# Patient Record
Sex: Female | Born: 1950
Health system: Southern US, Community
[De-identification: ages and names within clinical notes are randomized; demographics above are authoritative.]

## PROBLEM LIST (undated history)

## (undated) VITALS — BP 95/62 | HR 86 | Temp 97.4°F | Resp 16 | Ht 63.0 in | Wt 155.0 lb

## (undated) DIAGNOSIS — F329 Major depressive disorder, single episode, unspecified: Secondary | ICD-10-CM

## (undated) DIAGNOSIS — F419 Anxiety disorder, unspecified: Secondary | ICD-10-CM

## (undated) DIAGNOSIS — I10 Essential (primary) hypertension: Secondary | ICD-10-CM

## (undated) DIAGNOSIS — F32A Depression, unspecified: Secondary | ICD-10-CM

## (undated) DIAGNOSIS — E785 Hyperlipidemia, unspecified: Secondary | ICD-10-CM

## (undated) DIAGNOSIS — F209 Schizophrenia, unspecified: Secondary | ICD-10-CM

## (undated) HISTORY — DX: Depression, unspecified: F32.A

## (undated) HISTORY — DX: Hyperlipidemia, unspecified: E78.5

## (undated) HISTORY — PX: EXPLORATORY LAPAROTOMY: SUR591

## (undated) HISTORY — DX: Anxiety disorder, unspecified: F41.9

## (undated) HISTORY — PX: BREAST SURGERY: SHX581

## (undated) HISTORY — DX: Essential (primary) hypertension: I10

## (undated) HISTORY — DX: Major depressive disorder, single episode, unspecified: F32.9

---

## 1999-04-14 ENCOUNTER — Inpatient Hospital Stay (HOSPITAL_COMMUNITY): Admission: EM | Admit: 1999-04-14 | Discharge: 1999-04-17 | Payer: Self-pay | Admitting: Psychiatry

## 1999-10-06 ENCOUNTER — Emergency Department (HOSPITAL_COMMUNITY): Admission: EM | Admit: 1999-10-06 | Discharge: 1999-10-06 | Payer: Self-pay | Admitting: Emergency Medicine

## 2005-09-06 ENCOUNTER — Ambulatory Visit: Payer: Self-pay | Admitting: Family Medicine

## 2005-09-06 ENCOUNTER — Other Ambulatory Visit: Admission: RE | Admit: 2005-09-06 | Discharge: 2005-09-06 | Payer: Self-pay | Admitting: Family Medicine

## 2005-09-06 ENCOUNTER — Encounter (INDEPENDENT_AMBULATORY_CARE_PROVIDER_SITE_OTHER): Payer: Self-pay | Admitting: Specialist

## 2005-10-03 ENCOUNTER — Ambulatory Visit (HOSPITAL_COMMUNITY): Admission: RE | Admit: 2005-10-03 | Discharge: 2005-10-03 | Payer: Self-pay | Admitting: Family Medicine

## 2007-09-22 ENCOUNTER — Ambulatory Visit (HOSPITAL_COMMUNITY): Admission: RE | Admit: 2007-09-22 | Discharge: 2007-09-22 | Payer: Self-pay | Admitting: Internal Medicine

## 2008-02-06 ENCOUNTER — Ambulatory Visit: Payer: Self-pay | Admitting: Obstetrics & Gynecology

## 2008-02-06 ENCOUNTER — Encounter: Payer: Self-pay | Admitting: Family

## 2008-02-17 ENCOUNTER — Encounter: Admission: RE | Admit: 2008-02-17 | Discharge: 2008-02-17 | Payer: Self-pay | Admitting: Obstetrics & Gynecology

## 2008-05-07 ENCOUNTER — Inpatient Hospital Stay (HOSPITAL_COMMUNITY): Admission: AD | Admit: 2008-05-07 | Discharge: 2008-05-07 | Payer: Self-pay | Admitting: Obstetrics & Gynecology

## 2008-09-18 ENCOUNTER — Emergency Department (HOSPITAL_COMMUNITY): Admission: EM | Admit: 2008-09-18 | Discharge: 2008-09-19 | Payer: Self-pay | Admitting: Emergency Medicine

## 2008-12-07 ENCOUNTER — Ambulatory Visit (HOSPITAL_COMMUNITY): Admission: RE | Admit: 2008-12-07 | Discharge: 2008-12-07 | Payer: Self-pay | Admitting: Internal Medicine

## 2009-12-08 ENCOUNTER — Encounter: Payer: Self-pay | Admitting: Internal Medicine

## 2009-12-08 ENCOUNTER — Ambulatory Visit: Payer: Self-pay | Admitting: Cardiology

## 2009-12-08 ENCOUNTER — Inpatient Hospital Stay (HOSPITAL_COMMUNITY): Admission: AD | Admit: 2009-12-08 | Discharge: 2009-12-10 | Payer: Self-pay | Admitting: Internal Medicine

## 2009-12-09 ENCOUNTER — Ambulatory Visit: Payer: Self-pay | Admitting: Vascular Surgery

## 2009-12-09 ENCOUNTER — Encounter (INDEPENDENT_AMBULATORY_CARE_PROVIDER_SITE_OTHER): Payer: Self-pay | Admitting: Internal Medicine

## 2009-12-21 ENCOUNTER — Ambulatory Visit (HOSPITAL_COMMUNITY): Admission: RE | Admit: 2009-12-21 | Discharge: 2009-12-21 | Payer: Self-pay | Admitting: Internal Medicine

## 2010-11-12 ENCOUNTER — Encounter: Payer: Self-pay | Admitting: Internal Medicine

## 2010-11-16 ENCOUNTER — Other Ambulatory Visit (HOSPITAL_COMMUNITY): Payer: Self-pay | Admitting: Internal Medicine

## 2010-11-16 DIAGNOSIS — Z139 Encounter for screening, unspecified: Secondary | ICD-10-CM

## 2010-11-16 DIAGNOSIS — Z1231 Encounter for screening mammogram for malignant neoplasm of breast: Secondary | ICD-10-CM

## 2010-11-30 ENCOUNTER — Ambulatory Visit: Payer: Self-pay | Admitting: Physician Assistant

## 2010-12-11 ENCOUNTER — Other Ambulatory Visit (HOSPITAL_COMMUNITY)
Admission: RE | Admit: 2010-12-11 | Discharge: 2010-12-11 | Disposition: A | Discharge status: Home / Self Care | Payer: PRIVATE HEALTH INSURANCE | Source: Ambulatory Visit | Attending: Obstetrics & Gynecology | Admitting: Obstetrics & Gynecology

## 2010-12-11 ENCOUNTER — Encounter: Payer: Self-pay | Admitting: Physician Assistant

## 2010-12-11 ENCOUNTER — Ambulatory Visit: Payer: Medicare Other | Admitting: Physician Assistant

## 2010-12-11 ENCOUNTER — Other Ambulatory Visit: Payer: Self-pay | Admitting: Physician Assistant

## 2010-12-11 DIAGNOSIS — Z01419 Encounter for gynecological examination (general) (routine) without abnormal findings: Secondary | ICD-10-CM

## 2010-12-11 DIAGNOSIS — N899 Noninflammatory disorder of vagina, unspecified: Secondary | ICD-10-CM

## 2010-12-11 DIAGNOSIS — Z124 Encounter for screening for malignant neoplasm of cervix: Secondary | ICD-10-CM | POA: Insufficient documentation

## 2010-12-11 LAB — CONVERTED CEMR LAB
Trich, Wet Prep: NONE SEEN
Yeast Wet Prep HPF POC: NONE SEEN

## 2010-12-26 ENCOUNTER — Inpatient Hospital Stay (HOSPITAL_COMMUNITY): Admission: RE | Admit: 2010-12-26 | Payer: Self-pay | Source: Ambulatory Visit

## 2011-01-09 ENCOUNTER — Ambulatory Visit (HOSPITAL_COMMUNITY)
Admission: RE | Admit: 2011-01-09 | Discharge: 2011-01-09 | Disposition: A | Discharge status: Home / Self Care | Payer: PRIVATE HEALTH INSURANCE | Source: Ambulatory Visit | Attending: Internal Medicine | Admitting: Internal Medicine

## 2011-01-09 DIAGNOSIS — Z1231 Encounter for screening mammogram for malignant neoplasm of breast: Secondary | ICD-10-CM | POA: Insufficient documentation

## 2011-01-11 LAB — CBC
HCT: 41.1 % (ref 36.0–46.0)
Hemoglobin: 13.4 g/dL (ref 12.0–15.0)
MCHC: 32.5 g/dL (ref 30.0–36.0)
MCV: 78.3 fL (ref 78.0–100.0)
Platelets: 205 10*3/uL (ref 150–400)
RBC: 5.26 MIL/uL — ABNORMAL HIGH (ref 3.87–5.11)
RDW: 15.3 % (ref 11.5–15.5)
WBC: 8.5 10*3/uL (ref 4.0–10.5)

## 2011-01-11 LAB — DIFFERENTIAL
Basophils Absolute: 0 10*3/uL (ref 0.0–0.1)
Basophils Relative: 0 % (ref 0–1)
Eosinophils Absolute: 0.2 10*3/uL (ref 0.0–0.7)
Eosinophils Relative: 2 % (ref 0–5)
Lymphocytes Relative: 33 % (ref 12–46)
Lymphs Abs: 2.8 10*3/uL (ref 0.7–4.0)
Monocytes Absolute: 0.6 10*3/uL (ref 0.1–1.0)
Monocytes Relative: 7 % (ref 3–12)
Neutro Abs: 4.9 10*3/uL (ref 1.7–7.7)
Neutrophils Relative %: 57 % (ref 43–77)

## 2011-01-11 LAB — BASIC METABOLIC PANEL
BUN: 8 mg/dL (ref 6–23)
CO2: 28 mEq/L (ref 19–32)
Calcium: 9 mg/dL (ref 8.4–10.5)
Chloride: 104 mEq/L (ref 96–112)
Creatinine, Ser: 0.6 mg/dL (ref 0.4–1.2)
GFR calc Af Amer: >60 mL/min (ref >60)
GFR calc non Af Amer: >60 mL/min (ref >60)
Glucose, Bld: 116 mg/dL — ABNORMAL HIGH (ref 70–99)
Potassium: 3.7 mEq/L (ref 3.5–5.1)
Sodium: 139 mEq/L (ref 135–145)

## 2011-01-11 LAB — URINALYSIS, ROUTINE W REFLEX MICROSCOPIC
Bilirubin Urine: NEGATIVE
Glucose, UA: NEGATIVE mg/dL
Hgb urine dipstick: NEGATIVE
Ketones, ur: NEGATIVE mg/dL
Nitrite: NEGATIVE
Protein, ur: NEGATIVE mg/dL
Specific Gravity, Urine: 1.006 (ref 1.005–1.030)
Urobilinogen, UA: 0.2 mg/dL (ref 0.0–1.0)
pH: 6.5 (ref 5.0–8.0)

## 2011-01-12 LAB — CBC
HCT: 40.4 % (ref 36.0–46.0)
HCT: 41 % (ref 36.0–46.0)
Hemoglobin: 13.2 g/dL (ref 12.0–15.0)
Hemoglobin: 13.5 g/dL (ref 12.0–15.0)
MCHC: 32.7 g/dL (ref 30.0–36.0)
MCHC: 32.8 g/dL (ref 30.0–36.0)
MCV: 77.4 fL — ABNORMAL LOW (ref 78.0–100.0)
MCV: 77.8 fL — ABNORMAL LOW (ref 78.0–100.0)
Platelets: 192 10*3/uL (ref 150–400)
Platelets: 193 10*3/uL (ref 150–400)
RBC: 5.22 MIL/uL — ABNORMAL HIGH (ref 3.87–5.11)
RBC: 5.27 MIL/uL — ABNORMAL HIGH (ref 3.87–5.11)
RDW: 14.6 % (ref 11.5–15.5)
RDW: 15 % (ref 11.5–15.5)
WBC: 8.1 10*3/uL (ref 4.0–10.5)
WBC: 8.9 10*3/uL (ref 4.0–10.5)

## 2011-01-12 LAB — CK TOTAL AND CKMB (NOT AT ARMC)
CK, MB: 1.4 ng/mL (ref 0.3–4.0)
CK, MB: 1.4 ng/mL (ref 0.3–4.0)
CK, MB: 1.5 ng/mL (ref 0.3–4.0)
Relative Index: 1.4 (ref 0.0–2.5)
Relative Index: 1.4 (ref 0.0–2.5)
Relative Index: INVALID (ref 0.0–2.5)
Total CK: 100 U/L (ref 7–177)
Total CK: 110 U/L (ref 7–177)
Total CK: 84 U/L (ref 7–177)

## 2011-01-12 LAB — COMPREHENSIVE METABOLIC PANEL
ALT: 20 U/L (ref 0–35)
AST: 19 U/L (ref 0–37)
Albumin: 3.4 g/dL — ABNORMAL LOW (ref 3.5–5.2)
Alkaline Phosphatase: 117 U/L (ref 39–117)
BUN: 5 mg/dL — ABNORMAL LOW (ref 6–23)
CO2: 30 mEq/L (ref 19–32)
Calcium: 9.3 mg/dL (ref 8.4–10.5)
Chloride: 102 mEq/L (ref 96–112)
Creatinine, Ser: 0.68 mg/dL (ref 0.4–1.2)
GFR calc Af Amer: >60 mL/min (ref >60)
GFR calc non Af Amer: >60 mL/min (ref >60)
Glucose, Bld: 119 mg/dL — ABNORMAL HIGH (ref 70–99)
Potassium: 3.7 mEq/L (ref 3.5–5.1)
Sodium: 139 mEq/L (ref 135–145)
Total Bilirubin: 0.3 mg/dL (ref 0.3–1.2)
Total Protein: 6.7 g/dL (ref 6.0–8.3)

## 2011-01-12 LAB — LIPID PANEL
Cholesterol: 191 mg/dL (ref 0–200)
HDL: 36 mg/dL — ABNORMAL LOW (ref >39)
LDL Cholesterol: 132 mg/dL — ABNORMAL HIGH (ref 0–99)
Total CHOL/HDL Ratio: 5.3 RATIO
Triglycerides: 114 mg/dL (ref <150)
VLDL: 23 mg/dL (ref 0–40)

## 2011-01-12 LAB — URINALYSIS, ROUTINE W REFLEX MICROSCOPIC
Bilirubin Urine: NEGATIVE
Glucose, UA: NEGATIVE mg/dL
Hgb urine dipstick: NEGATIVE
Ketones, ur: NEGATIVE mg/dL
Nitrite: NEGATIVE
Protein, ur: NEGATIVE mg/dL
Specific Gravity, Urine: 1.019 (ref 1.005–1.030)
Urobilinogen, UA: 0.2 mg/dL (ref 0.0–1.0)
pH: 6 (ref 5.0–8.0)

## 2011-01-12 LAB — DIFFERENTIAL
Basophils Absolute: 0 10*3/uL (ref 0.0–0.1)
Basophils Relative: 1 % (ref 0–1)
Eosinophils Absolute: 0.2 10*3/uL (ref 0.0–0.7)
Eosinophils Relative: 2 % (ref 0–5)
Lymphocytes Relative: 36 % (ref 12–46)
Lymphs Abs: 3.2 10*3/uL (ref 0.7–4.0)
Monocytes Absolute: 0.6 10*3/uL (ref 0.1–1.0)
Monocytes Relative: 6 % (ref 3–12)
Neutro Abs: 4.9 10*3/uL (ref 1.7–7.7)
Neutrophils Relative %: 55 % (ref 43–77)

## 2011-01-12 LAB — BASIC METABOLIC PANEL
BUN: 6 mg/dL (ref 6–23)
CO2: 26 mEq/L (ref 19–32)
Calcium: 9 mg/dL (ref 8.4–10.5)
Chloride: 100 mEq/L (ref 96–112)
Creatinine, Ser: 0.63 mg/dL (ref 0.4–1.2)
GFR calc Af Amer: >60 mL/min (ref >60)
GFR calc non Af Amer: >60 mL/min (ref >60)
Glucose, Bld: 164 mg/dL — ABNORMAL HIGH (ref 70–99)
Potassium: 4.1 mEq/L (ref 3.5–5.1)
Sodium: 134 mEq/L — ABNORMAL LOW (ref 135–145)

## 2011-01-12 LAB — HEMOGLOBIN A1C
Hgb A1c MFr Bld: 7.3 % — ABNORMAL HIGH (ref 4.6–6.1)
Mean Plasma Glucose: 163 mg/dL

## 2011-01-12 LAB — TROPONIN I: Troponin I: 0.09 ng/mL — ABNORMAL HIGH (ref 0.00–0.06)

## 2011-01-12 LAB — PROTIME-INR
INR: 0.98 (ref 0.00–1.49)
Prothrombin Time: 12.9 seconds (ref 11.6–15.2)

## 2011-01-12 LAB — TSH: TSH: 2.856 u[IU]/mL (ref 0.350–4.500)

## 2011-01-12 LAB — APTT: aPTT: 26 seconds (ref 24–37)

## 2011-01-12 NOTE — Progress Notes (Signed)
NAME:  Shannon [name], Shannon [name]                ACCOUNT NO.:  [id number]  MEDICAL RECORD NO.:  [id number]           PATIENT TYPE:  A  LOCATION:  [facility]                   FACILITY:  [facility]  PHYSICIAN:  Maylon Cos, CNM    DATE OF BIRTH:  November 23, 1950  DATE OF SERVICE:  12/11/2010                                 CLINIC NOTE  The patient is being seen in GYN Clinic at [facility].  REASON FOR THE VISIT:  Pap smear and breast exam.  The patient also presents with complaints of burning in her vaginal area.  She recently changed soaps and that she may be having a reaction.  HISTORY OF PRESENT ILLNESS:  The patient is a 60 year old African American female who has multiple medical diagnoses including hypertension and mental illness as well as arthritis.  These are all managed by [facility] at her next appointment with them tomorrow.  She has not been started on any new medications since her last visit with [place] in 2009.  She states that this vaginal irritation started approximately 1 week ago since she has been using [employer]. She has had no vaginal odor and no pain with intercourse.  Last intercourse was approximately 2 weeks ago with partner that she has been with for more than 5 years.  MENSTRUAL HISTORY:  She is postmenopausal x6 years.  OBSTETRICAL HISTORY:  She is nulligravid.  GYNECOLOGIC HISTORY:  Her last Pap smear was in 2009 and it was normal, she does have a history of one abnormal in 2006; however, the treatment was done was a repeat Pap.  SURGICAL HISTORY:  Unchanged from 2009.  FAMILY HISTORY:  Noncontributory.  PERSONAL MEDICAL HISTORY:  Unchanged.  SOCIAL HISTORY:  Unchanged.  PHYSICAL EXAMINATION:  GENERAL:  Ms. Herzberg is a pleasant African American female who appears to be much older than her stated age of 37.  She is in no apparent distress. HEENT:  Grossly normal. VITAL SIGNS:  Today, blood pressure 130/74, her weight is 174, and her height is 63  inches. BREASTS:  Large and symmetrical.  There is no retracting or dimpling of the skin.  Her nipples are erect without discharge.  Palpation reveals no masses and nontender. ABDOMEN:  Obese, nontender to palpation.  No masses.  No hepatosplenomegaly. LYMPHS:  No lymphadenopathy. GENITALIA:  External genitalia without lesions.  There is no signs of irritation or external yeast.  Mucous membranes are pink and shiny. There is no rugae noted.  She does have lax tone, signs of atrophy. Cervix is parous and friable to the collection of the Pap smear.  She does have a moderate amount of a creamy yellow discharge that is nonodorous.  Bimanual exam is limited due to the patient's habitus. However, there is no cervical motion tenderness.  No enlargement of the uterus can be appreciated.  Adnexa are nonpalpable. EXTREMITIES:  She has equal range of motion x4 and no evidence of edema in the lower extremities.  ASSESSMENT: 1. Routine well woman exam. 2. Vaginitis. 3. Hypertension.  PLAN: 1. Pap smear was obtained and sent per routine to pathology. 2. Wet prep was collected to assess  for bacterial vaginosis, yeast,     and Trichomonas.  The patient declined STD screenings today.     Recommendations given to the patient to use baking soda soaks, half     a cup of baking soda in warm bath water soak until water cools off     once a day to help with vaginal irritation.  Nursing staff will     call if need for antibiotics or other medications based on results. 3. Routine healthcare.  The patient should continue her medications as     directed by Medical Clinic and follow up as scheduled tomorrow for     routine exam. 4. The patient should keep her mammogram appointment in March 2012 as     scheduled.  She should follow up annually for exam, pelvic exams     with [place] or [facility].  She will not need another Pap     smear for 2-3 years given her history as long as her Pap smears      normal.          ______________________________ Maylon Cos, CNM    SS/MEDQ  D:  12/11/2010  T:  12/12/2010  Job:  [id number]

## 2011-03-19 ENCOUNTER — Emergency Department (HOSPITAL_COMMUNITY)
Admission: EM | Admit: 2011-03-19 | Discharge: 2011-03-19 | Disposition: A | Discharge status: Home / Self Care | Payer: PRIVATE HEALTH INSURANCE | Attending: Emergency Medicine | Admitting: Emergency Medicine

## 2011-03-19 DIAGNOSIS — F411 Generalized anxiety disorder: Secondary | ICD-10-CM | POA: Insufficient documentation

## 2011-03-19 DIAGNOSIS — F29 Unspecified psychosis not due to a substance or known physiological condition: Secondary | ICD-10-CM | POA: Insufficient documentation

## 2011-03-19 DIAGNOSIS — Z139 Encounter for screening, unspecified: Secondary | ICD-10-CM | POA: Insufficient documentation

## 2011-03-19 LAB — BASIC METABOLIC PANEL
BUN: 9 mg/dL (ref 6–23)
CO2: 28 mEq/L (ref 19–32)
Calcium: 9.3 mg/dL (ref 8.4–10.5)
Chloride: 100 mEq/L (ref 96–112)
Creatinine, Ser: 0.84 mg/dL (ref 0.4–1.2)
GFR calc Af Amer: >60 mL/min (ref >60)
GFR calc non Af Amer: >60 mL/min (ref >60)
Glucose, Bld: 103 mg/dL — ABNORMAL HIGH (ref 70–99)
Potassium: 3.5 mEq/L (ref 3.5–5.1)
Sodium: 136 mEq/L (ref 135–145)

## 2011-03-19 LAB — DIFFERENTIAL
Basophils Absolute: 0 10*3/uL (ref 0.0–0.1)
Basophils Relative: 0 % (ref 0–1)
Eosinophils Absolute: 0.2 10*3/uL (ref 0.0–0.7)
Eosinophils Relative: 3 % (ref 0–5)
Lymphocytes Relative: 31 % (ref 12–46)
Lymphs Abs: 2.1 10*3/uL (ref 0.7–4.0)
Monocytes Absolute: 0.4 10*3/uL (ref 0.1–1.0)
Monocytes Relative: 5 % (ref 3–12)
Neutro Abs: 4.1 10*3/uL (ref 1.7–7.7)
Neutrophils Relative %: 60 % (ref 43–77)

## 2011-03-19 LAB — URINALYSIS, ROUTINE W REFLEX MICROSCOPIC
Bilirubin Urine: NEGATIVE
Glucose, UA: NEGATIVE mg/dL
Hgb urine dipstick: NEGATIVE
Ketones, ur: NEGATIVE mg/dL
Nitrite: NEGATIVE
Protein, ur: NEGATIVE mg/dL
Specific Gravity, Urine: 1.009 (ref 1.005–1.030)
Urobilinogen, UA: 1 mg/dL (ref 0.0–1.0)
pH: 6.5 (ref 5.0–8.0)

## 2011-03-19 LAB — GLUCOSE, CAPILLARY: Glucose-Capillary: 98 mg/dL (ref 70–99)

## 2011-03-19 LAB — CBC
HCT: 39.8 % (ref 36.0–46.0)
Hemoglobin: 13.1 g/dL (ref 12.0–15.0)
MCH: 24.7 pg — ABNORMAL LOW (ref 26.0–34.0)
MCHC: 32.9 g/dL (ref 30.0–36.0)
MCV: 75.1 fL — ABNORMAL LOW (ref 78.0–100.0)
Platelets: 220 10*3/uL (ref 150–400)
RBC: 5.3 MIL/uL — ABNORMAL HIGH (ref 3.87–5.11)
RDW: 15.2 % (ref 11.5–15.5)
WBC: 6.9 10*3/uL (ref 4.0–10.5)

## 2011-03-19 LAB — URINE MICROSCOPIC-ADD ON

## 2011-03-19 LAB — RAPID URINE DRUG SCREEN, HOSP PERFORMED
Amphetamines: NOT DETECTED
Barbiturates: NOT DETECTED
Benzodiazepines: NOT DETECTED
Cocaine: NOT DETECTED
Opiates: NOT DETECTED
Tetrahydrocannabinol: NOT DETECTED

## 2011-03-19 LAB — ETHANOL: Alcohol, Ethyl (B): <11 mg/dL — ABNORMAL HIGH (ref 0–10)

## 2011-03-21 LAB — URINE CULTURE
Colony Count: 7000
Culture  Setup Time: 201205290836

## 2011-07-20 LAB — URINALYSIS, ROUTINE W REFLEX MICROSCOPIC
Bilirubin Urine: NEGATIVE
Glucose, UA: NEGATIVE
Ketones, ur: NEGATIVE
Nitrite: NEGATIVE
Protein, ur: NEGATIVE
Specific Gravity, Urine: 1.01
Urobilinogen, UA: 1
pH: 7

## 2011-07-20 LAB — GC/CHLAMYDIA PROBE AMP, GENITAL
Chlamydia, DNA Probe: NEGATIVE
GC Probe Amp, Genital: NEGATIVE

## 2011-07-20 LAB — WET PREP, GENITAL
Clue Cells Wet Prep HPF POC: NONE SEEN
Yeast Wet Prep HPF POC: NONE SEEN

## 2011-07-20 LAB — URINE MICROSCOPIC-ADD ON

## 2011-10-03 ENCOUNTER — Encounter: Payer: Self-pay | Admitting: Advanced Practice Midwife

## 2011-10-03 ENCOUNTER — Ambulatory Visit (INDEPENDENT_AMBULATORY_CARE_PROVIDER_SITE_OTHER): Payer: PRIVATE HEALTH INSURANCE | Admitting: Advanced Practice Midwife

## 2011-10-03 DIAGNOSIS — Z780 Asymptomatic menopausal state: Secondary | ICD-10-CM | POA: Insufficient documentation

## 2011-10-03 DIAGNOSIS — N899 Noninflammatory disorder of vagina, unspecified: Secondary | ICD-10-CM

## 2011-10-03 DIAGNOSIS — N898 Other specified noninflammatory disorders of vagina: Secondary | ICD-10-CM

## 2011-10-03 NOTE — Progress Notes (Signed)
  Subjective:    Patient ID: Shannon [name], female    DOB: October 25, 1950, 60 y.o.   MRN: [id number]  HPI This is a 60 y.o. female who presents with concern over having vaginal burning sensation 2 weeks ago after protected intercourse. No burning since then. States wants to be checked for STDs. Does not have problems with vaginal dryness since menopause.     Review of Systems Negative    Objective:   Physical Exam  EGBUS:  Normal Vagina, slightly atrophic, no lesions, thin discharge, no erethema Cervix Normal Uterus normal without masses or tenderness Adnexa negative for tenderness      Assessment & Plan:  A:  Vaginal irritation in past 2 wks, now resolved       R/O STDs P:  GC/Chlamydia and wet prep done per request      Followup as needed

## 2011-10-03 NOTE — Progress Notes (Signed)
Pt is on other meds for cholesterol and diabetes but doesn't know the name.

## 2011-10-03 NOTE — Patient Instructions (Signed)
Safer Sex Your caregiver wants you to have this information about the infections that can be transmitted from sexual contact and how to prevent them. The idea behind safer sex is that you can be sexually active, and at the same time reduce the risk of giving or getting a sexually transmitted disease (STD). Every person should be aware of how to prevent him or herself and his or her sex partner from getting an STD. CAUSES OF STDS STDs are transmitted by sharing body fluids, which contain viruses and bacteria. The following fluids all transmit infections during sexual intercourse and sex acts:  Semen.   Saliva.   Urine.   Blood.   Vaginal mucus.  Examples of STDs include:  Chlamydia.   Gonorrhea.   Genital herpes.   Hepatitis B.   Human immunodeficiency virus or acquired immunodeficiency syndrome (HIV or AIDS).   Syphilis.   Trichomonas.   Pubic lice.   Human papillomavirus (HPV), which may include:   Genital warts.   Cervical dysplasia.   Cervical cancer (can develop with certain types of HPV).  SYMPTOMS  Sexual diseases often cause few or no symptoms until they are advanced, so a person can be infected and spread the infection without knowing it. Some STDs respond to treatment very well. Others, like HIV and herpes, cannot be cured, but are treated to reduce their effects. Specific symptoms include:  Abnormal vaginal discharge.   Irritation or itching in and around the vagina, and in the pubic hair.   Pain during sexual intercourse.   Bleeding during sexual intercourse.   Pelvic or abdominal pain.   Fever.   Growths in and around the vagina.   An ulcer in or around the vagina.   Swollen glands in the groin area.  DIAGNOSIS   Blood tests.   Pap test.   Culture test of abnormal vaginal discharge.   A test that applies a solution and examines the cervix with a lighted magnifying scope (colposcopy).   A test that examines the pelvis with a lighted  tube, through a small incision (laparoscopy).  TREATMENT  The treatment will depend on the cause of the STD.  Antibiotic treatment by injection, oral, creams, or suppositories in the vagina.   Over-the-counter medicated shampoo, to get rid of pubic lice.   Removing or treating growths with medicine, freezing, burning (electrocautery), or surgery.   Surgery treatment for HPV of the cervix.   Supportive medicines for herpes, HIV, AIDS, and hepatitis.  Being careful cannot eliminate all risk of infection, but sex can be made much safer. Safe sexual practices include body massage and gentle touching. Masturbation is safe, as long as body fluids do not contact skin that has sores or cuts. Dry kissing and oral sex on a man wearing a latex condom or on a woman wearing a female condom is also safe. Slightly less safe is intercourse while the man wears a latex condom or wet kissing. It is also safer to have one sex partner that you know is not having sex with anyone else. LENGTH OF ILLNESS An STD might be treated and cured in a week, sometimes a month, or more. And it can linger with symptoms for many years. STDs can also cause damage to the female organs. This can cause chronic pain, infertility, and recurrence of the STD, especially herpes, hepatitis, HIV, and HPV. HOME CARE INSTRUCTIONS AND PREVENTION  Alcohol and recreational drugs are often the reason given for not practicing safer sex. These substances affect   your judgment. Alcohol and recreational drugs can also impair your immune system, making you more vulnerable to disease.   Do not engage in risky and dangerous sexual practices, including:   Vaginal or anal sex without a condom.   Oral sex on a man without a condom.   Oral sex on a woman without a female condom.   Using saliva to lubricate a condom.   Any other sexual contact in which body fluids or blood from one partner contact the other partner.   You should use only latex  condoms for men and water soluble lubricants. Petroleum based lubricants or oils used to lubricate a condom will weaken the condom and increase the chance that it will break.   Think very carefully before having sex with anyone who is high risk for STDs and HIV. This includes IV drug users, people with multiple sexual partners, or people who have had an STD, or a positive hepatitis or HIV blood test.   Remember that even if your partner has had only one previous partner, their previous partner might have had multiple partners. If so, you are at high risk of being exposed to an STD. You and your sex partner should be the only sex partners with each other, with no one else involved.   A vaccine is available for hepatitis B and HPV through your caregiver or the Public Health Department. Everyone should be vaccinated with these vaccines.   Avoid risky sex practices. Sex acts that can break the skin make you more likely to get an STD.  SEEK MEDICAL CARE IF:   If you think you have an STD, even if you do not have any symptoms. Contact your caregiver for evaluation and treatment, if needed.   You think or know your sex partner has acquired an STD.   You have any of the symptoms mentioned above.  Document Released: 11/15/2004 Document Revised: 06/20/2011 Document Reviewed: 09/07/2009 ExitCare Patient Information 2012 [employer]. 

## 2011-10-04 LAB — WET PREP, GENITAL: Trich, Wet Prep: NONE SEEN

## 2011-10-04 LAB — GC/CHLAMYDIA PROBE AMP, GENITAL
Chlamydia, DNA Probe: NEGATIVE
GC Probe Amp, Genital: NEGATIVE

## 2011-12-04 ENCOUNTER — Other Ambulatory Visit (HOSPITAL_COMMUNITY): Payer: Self-pay | Admitting: Internal Medicine

## 2011-12-04 DIAGNOSIS — Z1231 Encounter for screening mammogram for malignant neoplasm of breast: Secondary | ICD-10-CM

## 2012-01-11 ENCOUNTER — Ambulatory Visit (HOSPITAL_COMMUNITY): Payer: PRIVATE HEALTH INSURANCE

## 2012-01-29 ENCOUNTER — Ambulatory Visit (HOSPITAL_COMMUNITY): Payer: PRIVATE HEALTH INSURANCE

## 2012-02-20 ENCOUNTER — Emergency Department (HOSPITAL_COMMUNITY)
Admission: EM | Admit: 2012-02-20 | Discharge: 2012-02-21 | Disposition: A | Discharge status: Home / Self Care | Payer: PRIVATE HEALTH INSURANCE | Attending: Emergency Medicine | Admitting: Emergency Medicine

## 2012-02-20 ENCOUNTER — Encounter (HOSPITAL_COMMUNITY): Payer: Self-pay | Admitting: *Deleted

## 2012-02-20 DIAGNOSIS — F17200 Nicotine dependence, unspecified, uncomplicated: Secondary | ICD-10-CM | POA: Insufficient documentation

## 2012-02-20 DIAGNOSIS — Z79899 Other long term (current) drug therapy: Secondary | ICD-10-CM | POA: Insufficient documentation

## 2012-02-20 DIAGNOSIS — I10 Essential (primary) hypertension: Secondary | ICD-10-CM | POA: Insufficient documentation

## 2012-02-20 DIAGNOSIS — E119 Type 2 diabetes mellitus without complications: Secondary | ICD-10-CM | POA: Insufficient documentation

## 2012-02-20 DIAGNOSIS — F29 Unspecified psychosis not due to a substance or known physiological condition: Secondary | ICD-10-CM | POA: Insufficient documentation

## 2012-02-20 NOTE — ED Notes (Signed)
EMS reports pt ran out of meds, thinks people are in here house to hurt her, has psy history, would not let ems obtain VS, wants help

## 2012-02-20 NOTE — ED Notes (Signed)
Pt states she is off her psy meds and needs help, she thinks peoplle are tying to kill her.

## 2012-02-21 ENCOUNTER — Ambulatory Visit (HOSPITAL_COMMUNITY): Admission: RE | Admit: 2012-02-21 | Payer: PRIVATE HEALTH INSURANCE | Source: Ambulatory Visit

## 2012-02-21 LAB — URINALYSIS, ROUTINE W REFLEX MICROSCOPIC
Bilirubin Urine: NEGATIVE
Glucose, UA: NEGATIVE mg/dL
Hgb urine dipstick: NEGATIVE
Ketones, ur: NEGATIVE mg/dL
Nitrite: NEGATIVE
Protein, ur: NEGATIVE mg/dL
Specific Gravity, Urine: 1.006 (ref 1.005–1.030)
Urobilinogen, UA: 1 mg/dL (ref 0.0–1.0)
pH: 6.5 (ref 5.0–8.0)

## 2012-02-21 LAB — BASIC METABOLIC PANEL
BUN: 6 mg/dL (ref 6–23)
CO2: 24 mEq/L (ref 19–32)
Calcium: 9 mg/dL (ref 8.4–10.5)
Chloride: 99 mEq/L (ref 96–112)
Creatinine, Ser: 0.6 mg/dL (ref 0.50–1.10)
GFR calc Af Amer: >90 mL/min (ref >90)
GFR calc non Af Amer: >90 mL/min (ref >90)
Glucose, Bld: 112 mg/dL — ABNORMAL HIGH (ref 70–99)
Potassium: 3.1 mEq/L — ABNORMAL LOW (ref 3.5–5.1)
Sodium: 134 mEq/L — ABNORMAL LOW (ref 135–145)

## 2012-02-21 LAB — RAPID URINE DRUG SCREEN, HOSP PERFORMED
Amphetamines: NOT DETECTED
Barbiturates: NOT DETECTED
Benzodiazepines: NOT DETECTED
Cocaine: NOT DETECTED
Opiates: NOT DETECTED
Tetrahydrocannabinol: NOT DETECTED

## 2012-02-21 LAB — URINE MICROSCOPIC-ADD ON

## 2012-02-21 LAB — CBC
HCT: 37.9 % (ref 36.0–46.0)
Hemoglobin: 12.7 g/dL (ref 12.0–15.0)
MCH: 24.9 pg — ABNORMAL LOW (ref 26.0–34.0)
MCHC: 33.5 g/dL (ref 30.0–36.0)
MCV: 74.2 fL — ABNORMAL LOW (ref 78.0–100.0)
Platelets: 252 10*3/uL (ref 150–400)
RBC: 5.11 MIL/uL (ref 3.87–5.11)
RDW: 14.3 % (ref 11.5–15.5)
WBC: 8.8 10*3/uL (ref 4.0–10.5)

## 2012-02-21 LAB — ETHANOL: Alcohol, Ethyl (B): <11 mg/dL (ref 0–11)

## 2012-02-21 MED ORDER — PERPHENAZINE 4 MG PO TABS: 4.0000 mg | ORAL_TABLET | Freq: Every day | ORAL | Status: DC

## 2012-02-21 MED ORDER — ALUM & MAG HYDROXIDE-SIMETH 200-200-20 MG/5ML PO SUSP: 30.0000 mL | ORAL | Status: DC | PRN

## 2012-02-21 MED ORDER — IRBESARTAN 75 MG PO TABS
75.0000 mg | ORAL_TABLET | Freq: Every day | ORAL | Status: DC
  Administered 2012-02-21: 75 mg via ORAL
  Filled 2012-02-21: qty 1

## 2012-02-21 MED ORDER — ACETAMINOPHEN 325 MG PO TABS: 650.0000 mg | ORAL_TABLET | ORAL | Status: DC | PRN

## 2012-02-21 MED ORDER — BENZTROPINE MESYLATE 1 MG PO TABS
2.0000 mg | ORAL_TABLET | Freq: Two times a day (BID) | ORAL | Status: DC
  Administered 2012-02-21: 2 mg via ORAL
  Filled 2012-02-21: qty 2

## 2012-02-21 MED ORDER — IBUPROFEN 600 MG PO TABS: 600.0000 mg | ORAL_TABLET | Freq: Three times a day (TID) | ORAL | Status: DC | PRN

## 2012-02-21 MED ORDER — ONDANSETRON HCL 4 MG PO TABS: 4.0000 mg | ORAL_TABLET | Freq: Three times a day (TID) | ORAL | Status: DC | PRN

## 2012-02-21 MED ORDER — POTASSIUM CHLORIDE CRYS ER 20 MEQ PO TBCR
40.0000 meq | EXTENDED_RELEASE_TABLET | Freq: Once | ORAL | Status: AC
  Administered 2012-02-21: 40 meq via ORAL
  Filled 2012-02-21: qty 2

## 2012-02-21 MED ORDER — NICOTINE 21 MG/24HR TD PT24: 21.0000 mg | MEDICATED_PATCH | Freq: Every day | TRANSDERMAL | Status: DC

## 2012-02-21 MED ORDER — ASPIRIN EC 81 MG PO TBEC
81.0000 mg | DELAYED_RELEASE_TABLET | Freq: Every day | ORAL | Status: DC
  Administered 2012-02-21: 81 mg via ORAL
  Filled 2012-02-21: qty 1

## 2012-02-21 MED ORDER — AMLODIPINE BESYLATE 5 MG PO TABS
5.0000 mg | ORAL_TABLET | Freq: Every day | ORAL | Status: DC
  Administered 2012-02-21: 5 mg via ORAL
  Filled 2012-02-21: qty 1

## 2012-02-21 MED ORDER — LORAZEPAM 1 MG PO TABS: 1.0000 mg | ORAL_TABLET | Freq: Three times a day (TID) | ORAL | Status: DC | PRN

## 2012-02-21 MED ORDER — TEMAZEPAM 15 MG PO CAPS: 15.0000 mg | ORAL_CAPSULE | Freq: Every evening | ORAL | Status: DC | PRN

## 2012-02-21 MED ORDER — ZOLPIDEM TARTRATE 5 MG PO TABS: 5.0000 mg | ORAL_TABLET | Freq: Every evening | ORAL | Status: DC | PRN

## 2012-02-21 NOTE — ED Provider Notes (Signed)
History     CSN: [id number]  Arrival date & time 02/20/12  2321   First MD Initiated Contact with Patient 02/21/12 0202      Chief Complaint  Patient presents with  . Medical Clearance    (Consider location/radiation/quality/duration/timing/severity/associated sxs/prior treatment) HPI 61 year old female presents emergency department with complaint of auditory and visual hallucinations of people telling her to kill herself. Patient denies wanting to hurt herself or other people, but reports "the people are telling me to kill myself".  Patient has been off her psychotropic meds for the last 7 days. She does not know which medicines she is on. Patient reports what she's taking her meds she feels somewhat better but ever all the way better. Patient has been admitted for same before. Patient denies any pain, no nausea vomiting, no rash no fever she denies any drug or alcohol abuse  Past Medical History  Diagnosis Date  . Hypertension   . Diabetes mellitus   . Hyperlipidemia   . Depression   . Anxiety     Past Surgical History  Procedure Date  . Breast surgery     No family history on file.  History  Substance Use Topics  . Smoking status: Current Everyday Smoker -- 1.0 packs/day for 40 years    Types: Cigarettes  . Smokeless tobacco: Never Used  . Alcohol Use: No    OB History    Grav Para Term Preterm Abortions TAB SAB Ect Mult Living   0 0 0 0 0 0 0 0 0 0       Review of Systems  Unable to perform ROS: Psychiatric disorder    Allergies  Review of patient's allergies indicates no known allergies.  Home Medications   Current Outpatient Rx  Name Route Sig Dispense Refill  . ASPIRIN 81 MG PO TABS Oral Take 81 mg by mouth daily.      Marland Kitchen AMLODIPINE BESYLATE 5 MG PO TABS Oral Take 5 mg by mouth daily.      Marland Kitchen BENZTROPINE MESYLATE 2 MG PO TABS Oral Take 2 mg by mouth 2 (two) times daily.      Marland Kitchen OLMESARTAN MEDOXOMIL 5 MG PO TABS Oral Take 5 mg by mouth daily.         BP 111/69  Pulse 75  Temp(Src) 98.4 F (36.9 C) (Oral)  Resp 18  SpO2 100%  Physical Exam  Nursing note and vitals reviewed. Constitutional: She is oriented to person, place, and time. She appears well-developed and well-nourished.       Morbidly obese, disheveled  HENT:  Head: Normocephalic and atraumatic.  Nose: Nose normal.  Mouth/Throat: Oropharynx is clear and moist.  Eyes: Conjunctivae and EOM are normal. Pupils are equal, round, and reactive to light.  Neck: Normal range of motion. Neck supple. No JVD present. No tracheal deviation present. No thyromegaly present.  Cardiovascular: Normal rate, regular rhythm, normal heart sounds and intact distal pulses.  Exam reveals no gallop and no friction rub.   No murmur heard. Pulmonary/Chest: Effort normal and breath sounds normal. No stridor. No respiratory distress. She has no wheezes. She has no rales. She exhibits no tenderness.  Abdominal: Soft. Bowel sounds are normal. She exhibits no distension and no mass. There is no tenderness. There is no rebound and no guarding.  Musculoskeletal: Normal range of motion. She exhibits no edema and no tenderness.  Lymphadenopathy:    She has no cervical adenopathy.  Neurological: She is oriented to person, place, and time. She  exhibits normal muscle tone. Coordination normal.  Skin: Skin is dry. No rash noted. No erythema. No pallor.  Psychiatric:       Patient reports auditory hallucinations, visual hallucinations. Patient has poor insight    ED Course  Procedures (including critical care time)  Labs Reviewed  BASIC METABOLIC PANEL - Abnormal; Notable for the following:    Sodium 134 (*)    Potassium 3.1 (*)    Glucose, Bld 112 (*)    All other components within normal limits  CBC - Abnormal; Notable for the following:    MCV 74.2 (*)    MCH 24.9 (*)    All other components within normal limits  URINALYSIS, ROUTINE W REFLEX MICROSCOPIC - Abnormal; Notable for the following:     Leukocytes, UA SMALL (*)    All other components within normal limits  URINE MICROSCOPIC-ADD ON - Abnormal; Notable for the following:    Squamous Epithelial / LPF FEW (*)    All other components within normal limits  ETHANOL  URINE RAPID DRUG SCREEN (HOSP PERFORMED)   No results found.   1. Psychosis       MDM  61 yo female off of medications, with psychosis.  Will move to psych ward, have ACT see, possible admission to [facility].        Olivia Mackie, MD 02/21/12 [phone]

## 2012-02-21 NOTE — ED Notes (Signed)
Pharmacist called and requested change of medication (temazepam) because insurance would not cover it. RX changed to Hydroxyzine 50 mg tab. Take 1 tab PO at bedtime PRN. Prescribed by Renne Crigler PA-C. RX called in by Risa Grill PRM to [phone].

## 2012-02-21 NOTE — Discharge Instructions (Signed)
Hallucinations and Delusions  You seem to be having hallucinations and/or delusions. You may be hearing voices that no one else can hear. This can seem very real to you. You may be having thoughts and fears that do not make sense to others. This condition can be due to mental disease like schizophrenia. It may be caused by a medical condition, such as an infection or electrolyte disturbance. These symptoms are also seen in drug abusers, especially those who use crack cocaine and amphetamines. Drugs like PCP, LSD, MDMA, peyote, and psilocybin can also cause frightening hallucinations and loss of control.  If your symptoms are due to drug abuse, your mental state should improve as the drug(s) leave your system. Someone you trust should be with you until you are better to protect you and calm your fears. Often tranquilizers are very helpful at controlling hallucinations, anxiety, and destructive behavior. Getting a proper diet and enough sleep is important to recovery. If your symptoms are not due to drugs, or do not improve over several days after stopping drug use, you need further medical or mental health care.  SEEK IMMEDIATE MEDICAL CARE IF:    Your symptoms get worse, especially if you think your life is in danger   You have violent or destructive thoughts.  Recovery is possible, but you have to get proper treatment and avoid drugs that are known to cause you trouble.  Document Released: 11/15/2004 Document Revised: 09/27/2011 Document Reviewed: 10/08/2005  ExitCare Patient Information 2012 [employer].

## 2012-02-21 NOTE — ED Provider Notes (Addendum)
Pt denies any complaints this am.  States she is feeling better.    Filed Vitals:   02/21/12 0553  BP: 111/69  Pulse: 75  Temp: 98.4 F (36.9 C)  Resp: 18   Car:  RRR Lungs CTA  Awaiting placement  Celene Kras, MD 02/21/12 0726  Pt was assessed by the psychaitrist, Dr Berlin Hun.   Pt will be started on Trilafon 4 mg po qhs and resoril po qhs prn insomnia.  Pt to follow up as an outpatient  Celene Kras, MD 02/21/12 1356

## 2012-02-21 NOTE — ED Notes (Signed)
Pt ambulates to restroom to provide sample 

## 2012-02-21 NOTE — BH Assessment (Signed)
Assessment Note   Shannon [name] is an 61 y.o. female. Patient presents to the ED reporting AVH. Patient reports hearing inaudible voices and sounds and seeing blasts of light.  Patient denied SI and HI and although reports she is afraid of the voices, reports not reacting in a way where she or others may be harmed or in danger of being harmed. Patient reports ignoring the voices or telling them to be quiet. Patient is not interested in inpatient treatment, but did express a need for sleep aid. Patient was seen by telepsych who recommended discharge.  EDP and patient's nurse notified and patient provided with information to followup at [facility].  Axis I: Psychotic Disorder NOS Axis II: Deferred Axis III:  Past Medical History  Diagnosis Date  . Hypertension   . Diabetes mellitus   . Hyperlipidemia   . Depression   . Anxiety    Axis IV: other psychosocial or environmental problems and problems with access to health care services Axis V: 51-60 moderate symptoms  Past Medical History:  Past Medical History  Diagnosis Date  . Hypertension   . Diabetes mellitus   . Hyperlipidemia   . Depression   . Anxiety     Past Surgical History  Procedure Date  . Breast surgery     Family History: No family history on file.  Social History:  reports that she has been smoking Cigarettes.  She has a 40 pack-year smoking history. She has never used smokeless tobacco. She reports that she does not drink alcohol or use illicit drugs.  Additional Social History:    Allergies: No Known Allergies  Home Medications:  (Not in a hospital admission)  OB/GYN Status:  No LMP recorded. Patient is postmenopausal.  General Assessment Data Location of Assessment: [facility] ED Living Arrangements: Other relatives;Alone Can pt return to current living arrangement?: Yes Admission Status: Voluntary Is patient capable of signing voluntary admission?: Yes Transfer from: Acute Hospital Referral Source:  Self/Family/Friend  Education Status Is patient currently in school?: No  Risk to self Suicidal Ideation: No Suicidal Intent: No Is patient at risk for suicide?: No Suicidal Plan?: No Access to Means: No Previous Attempts/Gestures: No Triggers for Past Attempts: None known Intentional Self Injurious Behavior: None Family Suicide History: No Persecutory voices/beliefs?: No Depression: No Depression Symptoms: Insomnia Substance abuse history and/or treatment for substance abuse?: No Suicide prevention information given to non-admitted patients: Not applicable  Risk to Others Homicidal Ideation: No Thoughts of Harm to Others: No Current Homicidal Intent: No Current Homicidal Plan: No Access to Homicidal Means: No History of harm to others?: No Assessment of Violence: None Noted Does patient have access to weapons?: No Criminal Charges Pending?: No Does patient have a court date: No  Psychosis Hallucinations: Auditory;Visual (Inaudible) Delusions: Unspecified  Mental Status Report Appear/Hygiene: Improved Eye Contact: Good Motor Activity: Restlessness Speech: Rapid;Pressured;Loud Level of Consciousness: Alert;Irritable Mood: Anxious Affect: Anxious Anxiety Level: Minimal Thought Processes: Relevant Judgement: Unimpaired Orientation: Place;Person;Time;Situation Obsessive Compulsive Thoughts/Behaviors: None  Cognitive Functioning Concentration: Normal Memory: Recent Intact;Remote Intact IQ: Average Insight: Fair Impulse Control: Fair Appetite: Good Sleep: Decreased Total Hours of Sleep: 3  Vegetative Symptoms: None  Prior Inpatient Therapy Prior Inpatient Therapy: No  Prior Outpatient Therapy Prior Outpatient Therapy: Yes Prior Therapy Dates: Current Prior Therapy Facilty/Provider(s): [facility] Reason for Treatment: Med Management                     Additional Information 1:1 In Past 12 Months?: No CIRT  Risk: No Elopement Risk: No Does  patient have medical clearance?: Yes     Disposition:  Disposition Disposition of Patient: Referred to (Outpatient referral) Patient referred to: Other (Comment) Vesta Mixer)  On Site Evaluation by:   Reviewed with Physician:     Marlaine Hind ANN S 02/21/2012 1:46 PM

## 2012-02-21 NOTE — ED Notes (Signed)
Report to Marcelle Smiling RN in psych ED-ready for pt

## 2012-02-21 NOTE — ED Notes (Signed)
Pt reports coming to the hospital because she has been off her meds for approx 2-3 weeks, pt paranoid believes people are in house. On assessment of pt, pt somnolent and attempts to cooperative however falls asleep, and states "i'm tired".

## 2012-02-21 NOTE — ED Notes (Signed)
Bottom teeth brought in by family, visitor

## 2012-02-29 ENCOUNTER — Telehealth (HOSPITAL_COMMUNITY): Payer: Self-pay | Admitting: Licensed Clinical Social Worker

## 2012-02-29 ENCOUNTER — Encounter (HOSPITAL_COMMUNITY): Payer: Self-pay | Admitting: Emergency Medicine

## 2012-02-29 ENCOUNTER — Inpatient Hospital Stay (HOSPITAL_COMMUNITY)
Admission: RE | Admit: 2012-02-29 | Discharge: 2012-03-08 | DRG: 885 | Disposition: A | Discharge status: Home / Self Care | Payer: PRIVATE HEALTH INSURANCE | Source: Ambulatory Visit | Attending: Psychiatry | Admitting: Psychiatry

## 2012-02-29 ENCOUNTER — Emergency Department (HOSPITAL_COMMUNITY)
Admission: EM | Admit: 2012-02-29 | Discharge: 2012-02-29 | Disposition: A | Discharge status: Other Acute Inpatient Hospital | Payer: PRIVATE HEALTH INSURANCE | Source: Home / Self Care | Attending: Emergency Medicine | Admitting: Emergency Medicine

## 2012-02-29 ENCOUNTER — Encounter (HOSPITAL_COMMUNITY): Payer: Self-pay | Admitting: Family Medicine

## 2012-02-29 DIAGNOSIS — F17200 Nicotine dependence, unspecified, uncomplicated: Secondary | ICD-10-CM | POA: Diagnosis present

## 2012-02-29 DIAGNOSIS — N390 Urinary tract infection, site not specified: Secondary | ICD-10-CM | POA: Insufficient documentation

## 2012-02-29 DIAGNOSIS — F200 Paranoid schizophrenia: Principal | ICD-10-CM | POA: Diagnosis present

## 2012-02-29 DIAGNOSIS — F29 Unspecified psychosis not due to a substance or known physiological condition: Secondary | ICD-10-CM | POA: Insufficient documentation

## 2012-02-29 DIAGNOSIS — E119 Type 2 diabetes mellitus without complications: Secondary | ICD-10-CM | POA: Insufficient documentation

## 2012-02-29 DIAGNOSIS — E785 Hyperlipidemia, unspecified: Secondary | ICD-10-CM | POA: Diagnosis present

## 2012-02-29 DIAGNOSIS — I10 Essential (primary) hypertension: Secondary | ICD-10-CM | POA: Insufficient documentation

## 2012-02-29 DIAGNOSIS — N3944 Nocturnal enuresis: Secondary | ICD-10-CM | POA: Diagnosis present

## 2012-02-29 DIAGNOSIS — F411 Generalized anxiety disorder: Secondary | ICD-10-CM | POA: Insufficient documentation

## 2012-02-29 DIAGNOSIS — F3289 Other specified depressive episodes: Secondary | ICD-10-CM | POA: Insufficient documentation

## 2012-02-29 DIAGNOSIS — Z79899 Other long term (current) drug therapy: Secondary | ICD-10-CM

## 2012-02-29 DIAGNOSIS — F329 Major depressive disorder, single episode, unspecified: Secondary | ICD-10-CM | POA: Insufficient documentation

## 2012-02-29 LAB — COMPREHENSIVE METABOLIC PANEL
ALT: 9 U/L (ref 0–35)
AST: 13 U/L (ref 0–37)
Albumin: 3.5 g/dL (ref 3.5–5.2)
Alkaline Phosphatase: 115 U/L (ref 39–117)
BUN: 11 mg/dL (ref 6–23)
CO2: 26 mEq/L (ref 19–32)
Calcium: 8.9 mg/dL (ref 8.4–10.5)
Chloride: 98 mEq/L (ref 96–112)
Creatinine, Ser: 0.7 mg/dL (ref 0.50–1.10)
GFR calc Af Amer: >90 mL/min (ref >90)
GFR calc non Af Amer: >90 mL/min (ref >90)
Glucose, Bld: 106 mg/dL — ABNORMAL HIGH (ref 70–99)
Potassium: 3.2 mEq/L — ABNORMAL LOW (ref 3.5–5.1)
Sodium: 133 mEq/L — ABNORMAL LOW (ref 135–145)
Total Bilirubin: 0.2 mg/dL — ABNORMAL LOW (ref 0.3–1.2)
Total Protein: 7.1 g/dL (ref 6.0–8.3)

## 2012-02-29 LAB — URINE MICROSCOPIC-ADD ON

## 2012-02-29 LAB — RAPID URINE DRUG SCREEN, HOSP PERFORMED
Amphetamines: NOT DETECTED
Barbiturates: NOT DETECTED
Benzodiazepines: NOT DETECTED
Cocaine: NOT DETECTED
Opiates: NOT DETECTED
Tetrahydrocannabinol: NOT DETECTED

## 2012-02-29 LAB — CBC
HCT: 39.2 % (ref 36.0–46.0)
Hemoglobin: 13.1 g/dL (ref 12.0–15.0)
MCH: 24.7 pg — ABNORMAL LOW (ref 26.0–34.0)
MCHC: 33.4 g/dL (ref 30.0–36.0)
MCV: 73.8 fL — ABNORMAL LOW (ref 78.0–100.0)
Platelets: 235 10*3/uL (ref 150–400)
RBC: 5.31 MIL/uL — ABNORMAL HIGH (ref 3.87–5.11)
RDW: 14.4 % (ref 11.5–15.5)
WBC: 8.1 10*3/uL (ref 4.0–10.5)

## 2012-02-29 LAB — URINALYSIS, ROUTINE W REFLEX MICROSCOPIC
Bilirubin Urine: NEGATIVE
Glucose, UA: NEGATIVE mg/dL
Hgb urine dipstick: NEGATIVE
Ketones, ur: NEGATIVE mg/dL
Nitrite: NEGATIVE
Protein, ur: NEGATIVE mg/dL
Specific Gravity, Urine: 1.013 (ref 1.005–1.030)
Urobilinogen, UA: 1 mg/dL (ref 0.0–1.0)
pH: 6 (ref 5.0–8.0)

## 2012-02-29 LAB — SALICYLATE LEVEL: Salicylate Lvl: <2 mg/dL — ABNORMAL LOW (ref 2.8–20.0)

## 2012-02-29 LAB — ETHANOL: Alcohol, Ethyl (B): <11 mg/dL (ref 0–11)

## 2012-02-29 LAB — ACETAMINOPHEN LEVEL: Acetaminophen (Tylenol), Serum: <15 ug/mL (ref 10–30)

## 2012-02-29 MED ORDER — BENZTROPINE MESYLATE 1 MG PO TABS
1.0000 mg | ORAL_TABLET | Freq: Two times a day (BID) | ORAL | Status: DC
  Administered 2012-02-29 – 2012-03-03 (×6): 1 mg via ORAL
  Filled 2012-02-29 (×11): qty 1

## 2012-02-29 MED ORDER — MAGNESIUM HYDROXIDE 400 MG/5ML PO SUSP: 30.0000 mL | Freq: Every day | ORAL | Status: DC | PRN

## 2012-02-29 MED ORDER — PIOGLITAZONE HCL 15 MG PO TABS
15.0000 mg | ORAL_TABLET | Freq: Every day | ORAL | Status: DC
  Administered 2012-02-29: 15 mg via ORAL
  Filled 2012-02-29: qty 1

## 2012-02-29 MED ORDER — AMLODIPINE BESYLATE 5 MG PO TABS
5.0000 mg | ORAL_TABLET | Freq: Every day | ORAL | Status: DC
  Administered 2012-02-29: 5 mg via ORAL
  Filled 2012-02-29: qty 1

## 2012-02-29 MED ORDER — PERPHENAZINE 4 MG PO TABS
4.0000 mg | ORAL_TABLET | Freq: Every day | ORAL | Status: DC
  Filled 2012-02-29: qty 1

## 2012-02-29 MED ORDER — ASPIRIN EC 81 MG PO TBEC
81.0000 mg | DELAYED_RELEASE_TABLET | Freq: Every day | ORAL | Status: DC
  Administered 2012-02-29: 81 mg via ORAL
  Filled 2012-02-29: qty 1

## 2012-02-29 MED ORDER — BENZTROPINE MESYLATE 1 MG PO TABS
1.0000 mg | ORAL_TABLET | Freq: Two times a day (BID) | ORAL | Status: DC
  Administered 2012-02-29: 1 mg via ORAL
  Filled 2012-02-29: qty 1

## 2012-02-29 MED ORDER — OLMESARTAN MEDOXOMIL-HCTZ 20-12.5 MG PO TABS: 1.0000 | ORAL_TABLET | Freq: Every day | ORAL | Status: DC

## 2012-02-29 MED ORDER — RISPERIDONE 2 MG PO TABS
2.0000 mg | ORAL_TABLET | Freq: Every day | ORAL | Status: DC
  Administered 2012-02-29 – 2012-03-02 (×3): 2 mg via ORAL
  Filled 2012-02-29 (×7): qty 1

## 2012-02-29 MED ORDER — RISPERIDONE 2 MG PO TABS: 2.0000 mg | ORAL_TABLET | Freq: Every day | ORAL | Status: DC

## 2012-02-29 MED ORDER — TRAZODONE HCL 50 MG PO TABS: 50.0000 mg | ORAL_TABLET | Freq: Every day | ORAL | Status: DC

## 2012-02-29 MED ORDER — ACETAMINOPHEN 325 MG PO TABS: 650.0000 mg | ORAL_TABLET | Freq: Four times a day (QID) | ORAL | Status: DC | PRN

## 2012-02-29 MED ORDER — ALUM & MAG HYDROXIDE-SIMETH 200-200-20 MG/5ML PO SUSP
30.0000 mL | ORAL | Status: DC | PRN
  Administered 2012-03-02 – 2012-03-08 (×5): 30 mL via ORAL

## 2012-02-29 MED ORDER — LATANOPROST 0.005 % OP SOLN
1.0000 [drp] | Freq: Every day | OPHTHALMIC | Status: DC
  Administered 2012-02-29 – 2012-03-07 (×8): 1 [drp] via OPHTHALMIC
  Filled 2012-02-29 (×3): qty 2.5

## 2012-02-29 MED ORDER — PERPHENAZINE 4 MG PO TABS
4.0000 mg | ORAL_TABLET | Freq: Every day | ORAL | Status: DC
  Administered 2012-02-29 – 2012-03-04 (×5): 4 mg via ORAL
  Filled 2012-02-29 (×7): qty 1

## 2012-02-29 MED ORDER — SIMVASTATIN 20 MG PO TABS
20.0000 mg | ORAL_TABLET | Freq: Every evening | ORAL | Status: DC
  Administered 2012-03-01 – 2012-03-02 (×2): 20 mg via ORAL
  Filled 2012-02-29 (×6): qty 1

## 2012-02-29 MED ORDER — HYDROCHLOROTHIAZIDE 12.5 MG PO CAPS
12.5000 mg | ORAL_CAPSULE | Freq: Every day | ORAL | Status: DC
  Administered 2012-02-29: 12.5 mg via ORAL
  Filled 2012-02-29: qty 1

## 2012-02-29 MED ORDER — CIPROFLOXACIN HCL 500 MG PO TABS
500.0000 mg | ORAL_TABLET | Freq: Two times a day (BID) | ORAL | Status: DC
  Administered 2012-02-29: 500 mg via ORAL
  Filled 2012-02-29: qty 1

## 2012-02-29 MED ORDER — HYDROCHLOROTHIAZIDE 12.5 MG PO CAPS
12.5000 mg | ORAL_CAPSULE | Freq: Every day | ORAL | Status: DC
  Administered 2012-03-01 – 2012-03-03 (×3): 12.5 mg via ORAL
  Filled 2012-02-29 (×6): qty 1

## 2012-02-29 MED ORDER — SIMVASTATIN 20 MG PO TABS
20.0000 mg | ORAL_TABLET | Freq: Every evening | ORAL | Status: DC
  Administered 2012-02-29: 20 mg via ORAL
  Filled 2012-02-29: qty 1

## 2012-02-29 MED ORDER — TEMAZEPAM 15 MG PO CAPS: 15.0000 mg | ORAL_CAPSULE | Freq: Every evening | ORAL | Status: DC | PRN

## 2012-02-29 MED ORDER — NICOTINE 21 MG/24HR TD PT24
21.0000 mg | MEDICATED_PATCH | TRANSDERMAL | Status: DC
  Administered 2012-02-29 (×2): 21 mg via TRANSDERMAL
  Filled 2012-02-29 (×2): qty 1

## 2012-02-29 MED ORDER — PIOGLITAZONE HCL 15 MG PO TABS
15.0000 mg | ORAL_TABLET | Freq: Every day | ORAL | Status: DC
  Administered 2012-03-01 – 2012-03-08 (×8): 15 mg via ORAL
  Filled 2012-02-29 (×6): qty 1
  Filled 2012-02-29: qty 7
  Filled 2012-02-29 (×5): qty 1

## 2012-02-29 MED ORDER — LATANOPROST 0.005 % OP SOLN
1.0000 [drp] | Freq: Every day | OPHTHALMIC | Status: DC
  Filled 2012-02-29: qty 2.5

## 2012-02-29 MED ORDER — IRBESARTAN 150 MG PO TABS
150.0000 mg | ORAL_TABLET | Freq: Every day | ORAL | Status: DC
  Administered 2012-02-29: 150 mg via ORAL
  Filled 2012-02-29 (×2): qty 1

## 2012-02-29 MED ORDER — IRBESARTAN 150 MG PO TABS
150.0000 mg | ORAL_TABLET | Freq: Every day | ORAL | Status: DC
  Administered 2012-03-01 – 2012-03-03 (×3): 150 mg via ORAL
  Filled 2012-02-29 (×7): qty 1

## 2012-02-29 MED ORDER — ONDANSETRON HCL 4 MG PO TABS: 4.0000 mg | ORAL_TABLET | Freq: Three times a day (TID) | ORAL | Status: DC | PRN

## 2012-02-29 MED ORDER — NICOTINE 21 MG/24HR TD PT24
21.0000 mg | MEDICATED_PATCH | TRANSDERMAL | Status: DC
  Administered 2012-03-01: 21 mg via TRANSDERMAL
  Filled 2012-02-29 (×3): qty 1

## 2012-02-29 MED ORDER — PANTOPRAZOLE SODIUM 40 MG PO TBEC
40.0000 mg | DELAYED_RELEASE_TABLET | Freq: Every day | ORAL | Status: DC
  Administered 2012-03-01 – 2012-03-03 (×4): 40 mg via ORAL
  Filled 2012-02-29 (×7): qty 1

## 2012-02-29 MED ORDER — TRAZODONE HCL 50 MG PO TABS
50.0000 mg | ORAL_TABLET | Freq: Every day | ORAL | Status: DC
  Administered 2012-02-29 – 2012-03-07 (×8): 50 mg via ORAL
  Filled 2012-02-29: qty 14
  Filled 2012-02-29 (×11): qty 1

## 2012-02-29 MED ORDER — AMLODIPINE BESYLATE 5 MG PO TABS
5.0000 mg | ORAL_TABLET | Freq: Every day | ORAL | Status: DC
  Administered 2012-03-01 – 2012-03-08 (×8): 5 mg via ORAL
  Filled 2012-02-29 (×4): qty 1
  Filled 2012-02-29: qty 7
  Filled 2012-02-29 (×7): qty 1

## 2012-02-29 MED ORDER — ASPIRIN EC 81 MG PO TBEC
81.0000 mg | DELAYED_RELEASE_TABLET | Freq: Every day | ORAL | Status: DC
  Administered 2012-03-01 – 2012-03-08 (×8): 81 mg via ORAL
  Filled 2012-02-29 (×2): qty 1
  Filled 2012-02-29: qty 7
  Filled 2012-02-29 (×9): qty 1

## 2012-02-29 MED ORDER — PANTOPRAZOLE SODIUM 40 MG PO TBEC
40.0000 mg | DELAYED_RELEASE_TABLET | Freq: Every day | ORAL | Status: DC
  Administered 2012-02-29: 40 mg via ORAL
  Filled 2012-02-29: qty 1

## 2012-02-29 MED ORDER — METRONIDAZOLE 500 MG PO TABS
1000.0000 mg | ORAL_TABLET | Freq: Two times a day (BID) | ORAL | Status: AC
  Administered 2012-03-01: 1000 mg via ORAL
  Filled 2012-02-29: qty 2
  Filled 2012-02-29: qty 1
  Filled 2012-02-29: qty 2

## 2012-02-29 NOTE — Consult Note (Signed)
Reason for Consult: Hallucinations, delusions and paranoia, noncompliant with medication Referring Physician: Dr. Delila Pereyra is an 61 y.o. female.  HPI: This is a 61 years old [place] American female, single, disabled lives in an apartment presented to [facility] long emergency department with the increase symptoms of psychosis including hallucinations, delusions and paranoia. Patient reported she came to the ER a week ago but does not required admission at that time. Patient went home, stopped her medication because he was afraid of visual and auditory hallucinations, hearing voices, seeing ghosts and dots that people, flashes of lights and not sleeping more than hour a day and not taking showers for a week.. Patient has no agitation, irritability and aggressive behaviors. She was taking Trilafon, Risperdal and Cogentin. She also taken trazodone for sleep. Patient following outpatient psychiatric services at [facility], [facility] and now at [facility] of care which is a [facility]. Patient stated she is following her nurse practitioner Dorise Hiss in all those practices.  Mental status: Patient appeared as per stated age and anxious and worried. She is an linear and goal-directed thought process and has a normal rate, rhythm and volume of speech. She has denied suicidal ideation or homicidal ideations. Patient has auditory visual hallucinations, delusions or paranoia. She has a poor insight judgment and impulse control  Past Medical History  Diagnosis Date  . Hypertension   . Diabetes mellitus   . Hyperlipidemia   . Depression   . Anxiety     Past Surgical History  Procedure Date  . Breast surgery     No family history on file.  Social History:  reports that she has been smoking Cigarettes.  She has a 40 pack-year smoking history. She has never used smokeless tobacco. She reports that she does not drink alcohol or use illicit drugs.  Allergies: No Known  Allergies  Medications: I have reviewed the patient's current medications.  Results for orders placed during the hospital encounter of 02/29/12 (from the past 48 hour(s))  CBC     Status: Abnormal   Collection Time   02/29/12  2:33 AM      Component Value Range Comment   WBC 8.1  4.0 - 10.5 (K/uL)    RBC 5.31 (*) 3.87 - 5.11 (MIL/uL)    Hemoglobin 13.1  12.0 - 15.0 (g/dL)    HCT 04.5  40.9 - 81.1 (%)    MCV 73.8 (*) 78.0 - 100.0 (fL)    MCH 24.7 (*) 26.0 - 34.0 (pg)    MCHC 33.4  30.0 - 36.0 (g/dL)    RDW 91.4  78.2 - 95.6 (%)    Platelets 235  150 - 400 (K/uL)   COMPREHENSIVE METABOLIC PANEL     Status: Abnormal   Collection Time   02/29/12  2:33 AM      Component Value Range Comment   Sodium 133 (*) 135 - 145 (mEq/L)    Potassium 3.2 (*) 3.5 - 5.1 (mEq/L)    Chloride 98  96 - 112 (mEq/L)    CO2 26  19 - 32 (mEq/L)    Glucose, Bld 106 (*) 70 - 99 (mg/dL)    BUN 11  6 - 23 (mg/dL)    Creatinine, Ser 2.13  0.50 - 1.10 (mg/dL)    Calcium 8.9  8.4 - 10.5 (mg/dL)    Total Protein 7.1  6.0 - 8.3 (g/dL)    Albumin 3.5  3.5 - 5.2 (g/dL)    AST 13  0 - 37 (U/L)    ALT 9  0 - 35 (U/L)    Alkaline Phosphatase 115  39 - 117 (U/L)    Total Bilirubin 0.2 (*) 0.3 - 1.2 (mg/dL)    GFR calc non Af Amer >90  >90 (mL/min)    GFR calc Af Amer >90  >90 (mL/min)   ACETAMINOPHEN LEVEL     Status: Normal   Collection Time   02/29/12  2:33 AM      Component Value Range Comment   Acetaminophen (Tylenol), Serum <15.0  10 - 30 (ug/mL)   SALICYLATE LEVEL     Status: Abnormal   Collection Time   02/29/12  2:33 AM      Component Value Range Comment   Salicylate Lvl <2.0 (*) 2.8 - 20.0 (mg/dL)   ETHANOL     Status: Normal   Collection Time   02/29/12  2:33 AM      Component Value Range Comment   Alcohol, Ethyl (B) <11  0 - 11 (mg/dL)   URINALYSIS, ROUTINE W REFLEX MICROSCOPIC     Status: Abnormal   Collection Time   02/29/12  3:16 AM      Component Value Range Comment   Color, Urine YELLOW   YELLOW     APPearance CLOUDY (*) CLEAR     Specific Gravity, Urine 1.013  1.005 - 1.030     pH 6.0  5.0 - 8.0     Glucose, UA NEGATIVE  NEGATIVE (mg/dL)    Hgb urine dipstick NEGATIVE  NEGATIVE     Bilirubin Urine NEGATIVE  NEGATIVE     Ketones, ur NEGATIVE  NEGATIVE (mg/dL)    Protein, ur NEGATIVE  NEGATIVE (mg/dL)    Urobilinogen, UA 1.0  0.0 - 1.0 (mg/dL)    Nitrite NEGATIVE  NEGATIVE     Leukocytes, UA LARGE (*) NEGATIVE    URINE MICROSCOPIC-ADD ON     Status: Abnormal   Collection Time   02/29/12  3:16 AM      Component Value Range Comment   Squamous Epithelial / LPF FEW (*) RARE     WBC, UA 21-50  <3 (WBC/hpf)    RBC / HPF 3-6  <3 (RBC/hpf)    Bacteria, UA MANY (*) RARE     Urine-Other MUCOUS PRESENT   TRICHOMONAS PRESENT  URINE RAPID DRUG SCREEN (HOSP PERFORMED)     Status: Normal   Collection Time   02/29/12  3:18 AM      Component Value Range Comment   Opiates NONE DETECTED  NONE DETECTED     Cocaine NONE DETECTED  NONE DETECTED     Benzodiazepines NONE DETECTED  NONE DETECTED     Amphetamines NONE DETECTED  NONE DETECTED     Tetrahydrocannabinol NONE DETECTED  NONE DETECTED     Barbiturates NONE DETECTED  NONE DETECTED      No results found.  No depression and Positive for bad mood, depression and Hallucinations, paranoia and delusions. Blood pressure 114/72, pulse 80, temperature 98.1 F (36.7 C), temperature source Oral, resp. rate 20, SpO2 96.00%.   Assessment/Plan: Schizophrenia, paranoid, chronic Noncompliance with treatment  Recommended acute psychiatric hospitalization for safety and stabilization. Will discontinue trazodone which was not helpful and continue rest of the home medication at this.  Sanav Remer,JANARDHAHA R. 02/29/2012, 11:35 AM

## 2012-02-29 NOTE — ED Notes (Signed)
Patient states she needs "to be checked all over 'cause I am hallucinating." States she has been seeing "figures of people." Denies SI and HI.

## 2012-02-29 NOTE — ED Notes (Signed)
Pt attended ED Behavioral Health group facilitated by chaplain.   Group was talk-group focused on hope.  Participants were encouraged to collectively brainstorm concrete definitions of hope.  Group members then connected these ideas to concrete experiences in their past and present.  Group members discussed whether hope was easy or difficult to hold, how past experiences play into hope, how hopes change and the role they play in engendering hope.   Pts then completed a mandala meditation exercise around hope and shared with the group.    Pt spoke with group about losses of friends in community and church and feeling lonely when at home, as she does not have companions or support.  Spoke about her brother coming to visit her.   Pt identified areas of hope connected to her faith tradition and processed these with facilitator and group.  Pt spoke with group about how she "carries these within her."   Belva Crome  MDiv, [address]

## 2012-02-29 NOTE — ED Notes (Signed)
Pt ambulated to the BR with steady gait without difficulty.

## 2012-02-29 NOTE — ED Provider Notes (Signed)
History     CSN: [id number]  Arrival date & time 02/29/12  0117   First MD Initiated Contact with Patient 02/29/12 0308      Chief Complaint  Patient presents with  . Hallucinations    (Consider location/radiation/quality/duration/timing/severity/associated sxs/prior treatment) The history is provided by the patient.   patient has been having hallucinations. She states she is afraid to live at home. She states she's not eating she is afraid. She's had an visual and auditory hallucinations. She's been seen in ER previously several times most recently last week. No suicidal or homicidal thoughts. No fevers. No abdominal pain. No headaches. She states she has been taking her medications.  Past Medical History  Diagnosis Date  . Hypertension   . Diabetes mellitus   . Hyperlipidemia   . Depression   . Anxiety     Past Surgical History  Procedure Date  . Breast surgery     No family history on file.  History  Substance Use Topics  . Smoking status: Current Everyday Smoker -- 1.0 packs/day for 40 years    Types: Cigarettes  . Smokeless tobacco: Never Used  . Alcohol Use: No    OB History    Grav Para Term Preterm Abortions TAB SAB Ect Mult Living   0 0 0 0 0 0 0 0 0 0       Review of Systems  Constitutional: Negative for activity change and appetite change.  HENT: Negative for neck stiffness.   Eyes: Negative for pain.  Respiratory: Negative for chest tightness and shortness of breath.   Cardiovascular: Negative for chest pain and leg swelling.  Gastrointestinal: Negative for nausea, vomiting, abdominal pain and diarrhea.  Genitourinary: Negative for flank pain.  Musculoskeletal: Negative for back pain.  Skin: Negative for rash.  Neurological: Negative for weakness, numbness and headaches.  Psychiatric/Behavioral: Positive for hallucinations. Negative for behavioral problems and confusion.    Allergies  Review of patient's allergies indicates no known  allergies.  Home Medications   Current Outpatient Rx  Name Route Sig Dispense Refill  . ASPIRIN EC 81 MG PO TBEC Oral Take 81 mg by mouth daily.    Marland Kitchen AMLODIPINE BESYLATE 5 MG PO TABS Oral Take 5 mg by mouth daily.      Marland Kitchen BENZTROPINE MESYLATE 1 MG PO TABS Oral Take 1 mg by mouth 2 (two) times daily.    Marland Kitchen LATANOPROST 0.005 % OP SOLN Both Eyes Place 1 drop into both eyes at bedtime.    Marland Kitchen NICOTINE 21 MG/24HR TD PT24 Transdermal Place 1 patch onto the skin daily.    Marland Kitchen OLMESARTAN MEDOXOMIL-HCTZ 20-12.5 MG PO TABS Oral Take 1 tablet by mouth daily.    Marland Kitchen OMEPRAZOLE 20 MG PO CPDR Oral Take 40 mg by mouth daily.    Marland Kitchen PERPHENAZINE 4 MG PO TABS Oral Take 1 tablet (4 mg total) by mouth at bedtime. 30 tablet 1  . PIOGLITAZONE HCL 15 MG PO TABS Oral Take 15 mg by mouth daily.    Marland Kitchen RISPERIDONE 2 MG PO TABS Oral Take 2 mg by mouth at bedtime.     Marland Kitchen SIMVASTATIN 20 MG PO TABS Oral Take 20 mg by mouth every evening.    Marland Kitchen TEMAZEPAM 15 MG PO CAPS Oral Take 1 capsule (15 mg total) by mouth at bedtime as needed for sleep. 30 capsule 0  . TRAZODONE HCL 50 MG PO TABS Oral Take 50 mg by mouth at bedtime.      BP 114/72  Pulse 80  Temp(Src) 98.1 F (36.7 C) (Oral)  Resp 20  SpO2 96%  Physical Exam  Nursing note and vitals reviewed. Constitutional: She is oriented to person, place, and time. She appears well-developed and well-nourished.  HENT:  Head: Normocephalic and atraumatic.  Eyes: EOM are normal. Pupils are equal, round, and reactive to light.  Neck: Normal range of motion. Neck supple.  Cardiovascular: Normal rate, regular rhythm and normal heart sounds.   No murmur heard. Pulmonary/Chest: Effort normal and breath sounds normal. No respiratory distress. She has no wheezes. She has no rales.  Abdominal: Soft. Bowel sounds are normal. She exhibits no distension. There is no tenderness. There is no rebound and no guarding.  Musculoskeletal: Normal range of motion.  Neurological: She is alert and  oriented to person, place, and time. No cranial nerve deficit.  Skin: Skin is warm and dry.  Psychiatric: Her speech is normal.       Patient has pressured speech.    ED Course  Procedures (including critical care time)  Labs Reviewed  URINALYSIS, ROUTINE W REFLEX MICROSCOPIC - Abnormal; Notable for the following:    APPearance CLOUDY (*)    Leukocytes, UA LARGE (*)    All other components within normal limits  CBC - Abnormal; Notable for the following:    RBC 5.31 (*)    MCV 73.8 (*)    MCH 24.7 (*)    All other components within normal limits  COMPREHENSIVE METABOLIC PANEL - Abnormal; Notable for the following:    Sodium 133 (*)    Potassium 3.2 (*)    Glucose, Bld 106 (*)    Total Bilirubin 0.2 (*)    All other components within normal limits  SALICYLATE LEVEL - Abnormal; Notable for the following:    Salicylate Lvl <2.0 (*)    All other components within normal limits  URINE MICROSCOPIC-ADD ON - Abnormal; Notable for the following:    Squamous Epithelial / LPF FEW (*)    Bacteria, UA MANY (*)    All other components within normal limits  ACETAMINOPHEN LEVEL  ETHANOL  URINE RAPID DRUG SCREEN (HOSP PERFORMED)  URINE CULTURE   No results found.   1. Psychosis   2. UTI (urinary tract infection)       MDM  Patient presents with hallucinations and psychosis. She was recently seen in the ER for the same and had her medications adjusted. She states she's not been able to eat because she is afraid. Lab work shows a urinary tract infection. Urine culture was sent and she started on antibiotics. She will be seen by ACT team.        Juliet Rude. Rubin Payor, MD 02/29/12 [phone]

## 2012-02-29 NOTE — BH Assessment (Signed)
Assessment Note   Shannon [name] is an 61 y.o. female. Patient presents to the ED reporting AVH. Her symptoms have been on-going x2 months. Last night she sts, "I couldn't take it anymore.Marland KitchenMarland KitchenI am so scarred". Patient reports hearing inaudible voices and sounds. She has visual hallucinations of ghost, dead people, flashes of light, and dots. Sts that the psychotic symptoms are interrupting her daily functions. She is has been afraid to bathe feeling that a ghost will scare her while she is showering. Patient has not showered in 1 week. She is also unable to sleep. She hasn't slept more than 1 hr per night in the past 3 weeks. Patient denied SI and HI and although reports she is afraid of the voices. Patient fears that someone is out to harm her and fears for her life. Patient requesting medications to help her with the AVH's. Sts that she was prescribed medications by her out-pt psychiatrist at [facility] 3 weeks ago. She took the medications x1 week and then stopped taking it stating that she was having terrible nightmares. Patient is unable to provide the name, dosage, and/or frequency of the medication that she was prescribed.   Writer discussed clinicals with psychiatrist Dr. Henrene Hawking. He will evaluate patient and make the appropriate recommendations.   Axis I: Psychotic Disorder Nos Axis II: Deferred Axis III:  Past Medical History  Diagnosis Date  . Hypertension   . Diabetes mellitus   . Hyperlipidemia   . Depression   . Anxiety    Axis IV: other psychosocial or environmental problems, problems related to social environment, problems with access to health care services and problems with primary support group Axis V: 31-40 impairment in reality testing  Past Medical History:  Past Medical History  Diagnosis Date  . Hypertension   . Diabetes mellitus   . Hyperlipidemia   . Depression   . Anxiety     Past Surgical History  Procedure Date  . Breast surgery      Family History: No family history on file.  Social History:  reports that she has been smoking Cigarettes.  She has a 40 pack-year smoking history. She has never used smokeless tobacco. She reports that she does not drink alcohol or use illicit drugs.  Additional Social History:  Alcohol / Drug Use Pain Medications: see listed meds noted by ED staff. Prescriptions: see listed meds noted by ED staff. Over the Counter: see listed meds noted by ED staff. History of alcohol / drug use?: Yes (pt reports a hx of alcohol and drug use; last used 6 mo's ag) Longest period of sobriety (when/how long): unk Allergies: No Known Allergies  Home Medications:  (Not in a hospital admission)  OB/GYN Status:  No LMP recorded. Patient is postmenopausal.  General Assessment Data Location of Assessment: [facility] ED Living Arrangements: Other relatives;Alone Can pt return to current living arrangement?: Yes Admission Status: Voluntary Is patient capable of signing voluntary admission?: Yes Transfer from: Acute Hospital Referral Source: Self/Family/Friend  Education Status Is patient currently in school?: No  Risk to self Suicidal Ideation: No Suicidal Intent: No Is patient at risk for suicide?: No Suicidal Plan?: No Access to Means: No Previous Attempts/Gestures: No How many times?:  (0) Other Self Harm Risks:  (n/a) Triggers for Past Attempts: None known Intentional Self Injurious Behavior: None Family Suicide History: No Recent stressful life event(s):  (psychotic symptoms are preventing her from preforming daily ) Depression: No Depression Symptoms: Insomnia;Feeling angry/irritable Substance abuse history  and/or treatment for substance abuse?: Yes (pt reports a history of alcohol and drug use) Suicide prevention information given to non-admitted patients: Not applicable  Risk to Others Homicidal Ideation: No Thoughts of Harm to Others: No Current Homicidal Intent: No Current Homicidal  Plan: No Access to Homicidal Means: No Identified Victim:  (n/a) History of harm to others?: No Assessment of Violence: None Noted Violent Behavior Description:  (patient calm and cooperative in the ED) Does patient have access to weapons?: No Criminal Charges Pending?: No Does patient have a court date: No  Psychosis Hallucinations: Auditory (pt unable to make out voices; inaudible) Delusions: Unspecified (stsn she see's spirits, dots, flashes, ghost, dead people)  Mental Status Report Appear/Hygiene: Improved Eye Contact: Fair Motor Activity: Restlessness Speech: Rapid;Pressured;Loud Level of Consciousness: Alert;Irritable Mood: Anxious Affect: Anxious Anxiety Level: Moderate Thought Processes: Relevant Judgement: Impaired Orientation: Place;Person;Time;Situation Obsessive Compulsive Thoughts/Behaviors: None  Cognitive Functioning Concentration: Normal Memory: Recent Intact;Remote Intact IQ: Average Insight: Fair Impulse Control: Fair Appetite: Poor Weight Loss:  (0) Weight Gain:  (0) Sleep: Decreased Total Hours of Sleep:  ("1 hour of sleep at the most") Vegetative Symptoms: None  Prior Inpatient Therapy Prior Inpatient Therapy: Yes Prior Therapy Dates:  (20 yrs ago) Prior Therapy Facilty/Provider(s):  [facility] and another place pt is unable to recall name) Reason for Treatment:  ("hearing voices")  Prior Outpatient Therapy Prior Outpatient Therapy: Yes Prior Therapy Dates: Current Prior Therapy Facilty/Provider(s):  [facility]-currently; [facility]-1 yr ago) Reason for Treatment: Med Management  ADL Screening (condition at time of admission) Patient's cognitive ability adequate to safely complete daily activities?: Yes Patient able to express need for assistance with ADLs?: Yes Independently performs ADLs?: Yes Weakness of Legs: None Weakness of Arms/Hands: None  Home Assistive Devices/Equipment Home Assistive Devices/Equipment: None     Abuse/Neglect Assessment (Assessment to be complete while patient is alone) Physical Abuse: Denies Verbal Abuse: Denies Sexual Abuse: Denies Exploitation of patient/patient's resources: Denies Self-Neglect: Denies Values / Beliefs Cultural Requests During Hospitalization: None Spiritual Requests During Hospitalization: None     Nutrition Screen Diet: Regular Unintentional weight loss greater than 10lbs within the last month: No Problems chewing or swallowing foods and/or liquids: No Home Tube Feeding or Total Parenteral Nutrition (TPN): No Patient appears severely malnourished: No Pregnant or Lactating: No  Additional Information 1:1 In Past 12 Months?: No CIRT Risk: No Elopement Risk: No Does patient have medical clearance?: Yes     Disposition:  Disposition Disposition of Patient:  (Disposition Pending psychiatric evaluation with Dr. Mylo Red)  On Site Evaluation by:   Reviewed with Physician:     Melynda Ripple [facility] 02/29/2012 9:50 AM

## 2012-02-29 NOTE — Progress Notes (Signed)
Report given to Joann, RN

## 2012-02-29 NOTE — ED Notes (Signed)
Pt arrives on unit in calm and cooperative mood, friendly with staff. Pt states she is here because she is on "way too much medicine and my mind ain't right." Pt states she feels like she is "on dope" and that "the doctor got to fix this cause I ain't got time to be felling like this." Oriented patient to unit, bathroom and general schedule. Pt states she will be content waiting for the doctor in her room. No acute distress.

## 2012-02-29 NOTE — ED Provider Notes (Signed)
Per Shannon [name], ACT patient has been accepted at [facility] by Dr Allena Katz.  The patient is alert and cooperative and agreeable to being admitted. She states that she lives home alone and since she's been on new medication she is hearing that people were in the house with her which is scaring her. She relates she feels sleepy today and reports that she was awakened a lot.  Devoria Albe, MD, Armando Gang   Ward Givens, MD 02/29/12 [phone]

## 2012-02-29 NOTE — Tx Team (Signed)
Initial Interdisciplinary Treatment Plan  PATIENT STRENGTHS: (choose at least two) Ability for insight Active sense of humor Average or above average intelligence Capable of independent living Communication skills General fund of knowledge Motivation for treatment/growth Physical Health Supportive family/friends  PATIENT STRESSORS: Medication change or noncompliance   PROBLEM LIST: Problem List/Patient Goals Date to be addressed Date deferred Reason deferred Estimated date of resolution  Hallucinations 02/29/12     Anxiety 02/29/12                                                DISCHARGE CRITERIA:  Ability to meet basic life and health needs Adequate post-discharge living arrangements Improved stabilization in mood, thinking, and/or behavior Motivation to continue treatment in a less acute level of care Need for constant or close observation no longer present Safe-care adequate arrangements made Verbal commitment to aftercare and medication compliance  PRELIMINARY DISCHARGE PLAN: Outpatient therapy Return to previous living arrangement  PATIENT/FAMIILY INVOLVEMENT: This treatment plan has been presented to and reviewed with the patient, Shannon [name], and/or family member, .  The patient and family have been given the opportunity to ask questions and make suggestions.  Shannon [name] 02/29/2012, 8:00 PM

## 2012-02-29 NOTE — ED Notes (Signed)
Pickering, MD at bedside.  

## 2012-02-29 NOTE — Progress Notes (Signed)
Patient ID: Shannon [name], female   DOB: August 07, 1951, 61 y.o.   MRN: [id number] Pt admitted to [facility] for A/V hallucinations. Pt states she does not want to go back to her home because she is seeing ghosts there. Pt also states she is hearing voices and has been to the ED several times in the past 2 weeks because she was afraid. Pt states she stopped taking her prescribed medications because they caused her to have bad nightmares. Pt has not showered because she was scared to be alone in bathroom.   Pt pleasant, smiling and cooperative with Clinical research associate during admission assessment. No s/s of distress noted. Pt denies SI/HI at this time.

## 2012-03-01 DIAGNOSIS — F29 Unspecified psychosis not due to a substance or known physiological condition: Secondary | ICD-10-CM

## 2012-03-01 LAB — URINE CULTURE
Colony Count: >=100000
Culture  Setup Time: 201305100821

## 2012-03-01 MED ORDER — NICOTINE 21 MG/24HR TD PT24
21.0000 mg | MEDICATED_PATCH | TRANSDERMAL | Status: DC
  Administered 2012-03-02 – 2012-03-08 (×8): 21 mg via TRANSDERMAL
  Filled 2012-03-01 (×2): qty 1
  Filled 2012-03-01: qty 14
  Filled 2012-03-01 (×7): qty 1

## 2012-03-01 NOTE — H&P (Signed)
  Pt was seen by me today and I agree with the key elements documented in H&P.  

## 2012-03-01 NOTE — Progress Notes (Signed)
Patient ID: Shannon [name], female   DOB: 18-Jan-1951, 61 y.o.   MRN: [id number] The patient denied any auditory hallucinations at present. However, continues to endorse seeing shadows and spots on the wall. The patient has a h/o cataracts and was asked when she had her last examination. Stated she is scheduled to see her Opthalmologic next Thursday. Woke up from her sleep screaming because she thought her roommate was standing over her in her bed. She was reassured of her safety and shown that her roommate was sleeping. She had a hard time believing she had been dreaming. Was offered and accepted the quiet room to sleep in.

## 2012-03-01 NOTE — [facility] Suicide Risk Assessment (Signed)
Suicide Risk Assessment  Admission Assessment     Demographic factors:  Assessment Details Time of Assessment: Admission Information Obtained From: Patient Current Mental Status:   see below Loss Factors:   disabled Historical Factors:   no attempts in past Risk Reduction Factors:  Risk Reduction Factors: Living with another person, especially a relative;Positive social support;Positive therapeutic relationship  CLINICAL FACTORS:   Schizophrenia:   Paranoid or undifferentiated type  COGNITIVE FEATURES THAT CONTRIBUTE TO RISK:  Loss of executive function    SUICIDE RISK:   Mild:  Suicidal ideation of limited frequency, intensity, duration, and specificity.  There are no identifiable plans, no associated intent, mild dysphoria and related symptoms, good self-control (both objective and subjective assessment), few other risk factors, and identifiable protective factors, including available and accessible social support.  PLAN OF CARE:   Mental Status Examination/Evaluation:  Objective: Appearance: Disheveled   Psychomotor Activity: Normal   Eye Contact:: Good   Speech: Normal Rate   Volume: Normal   Mood:apropriate cheerful   Affect: Congruent   Thought Process: Clear rational goal oriented -simplify meds   Orientation: Full   Thought Content: Hallucinations: Visual   Suicidal Thoughts: No   Homicidal Thoughts: No   Judgement: Impaired   Insight: poor  DIAGNOSIS:  AXIS I  Psychotic Disorder NOS   AXIS II  Deferred   AXIS III  See medical history.  HTN  DM  Hyperlipidemia   AXIS IV  non-compliance   AXIS V  20  Treatment Plan Summary:  Admit for safety & stabilization  Restart antipsychotic  Contact [facility] Care    Shannon [name] 03/01/2012, 8:14 PM

## 2012-03-01 NOTE — Progress Notes (Signed)
Patient ID: ALANEY WITTER, female   DOB: [birth year], 61 y.o.   MRN: [id number] Pt. attended and participated in aftercare planning group. Pt. accepted information on suicide prevention, warning signs to look for with suicide and crisis line numbers to use. The pt. agreed to call crisis line numbers if having warning signs or having thoughts of suicide. Pt. listed their current anxiety level as medium.  Pt. stated she "was feeling good like the color yellow".  Counseling Group Note:  Pt. was attentive, sharing however insight limited.  Therapist discussed healthy coping mechanisms when feeling depressed and reasons why  these coping skills are needed.  Therapist invited group to participate in a drawing activity in which the patient is to outline their hand and list 5 healthy activities that they can do when having a rough day.  Additionally, therapist asked group to name at least one activity they can do when feeling depressed.  Pt. stated that she "eat or go to church".

## 2012-03-01 NOTE — Progress Notes (Signed)
Pt is pleasant on approach but is somewhat guarded and suspicious  She was complaining of having so many pills to take   She was giong to walk away from the medications window before taking the medicine  She admits to being depressed and is a bit disorganized in her thinking  She has some mild confusion and difficulty with her memory   Verbal support given  Medications administred and effectiveness monitored  Q 15 min checks   Pt safe at present

## 2012-03-01 NOTE — H&P (Signed)
Psychiatric Admission Assessment Adult  Patient Identification:  Shannon [name] Date of Evaluation:  03/01/2012 61 yoWAAF CC: Presented to [facility] c/o  Visual hallucinations History of Present Illness: Is a long time Mental Health patient. Has had changes lately from [facility] Care and she reports havinghad side effects to meds. Intake reports non-compliance. She had presented about a week ago but did not require admission. Today she says that she was seeing people walking through the walls where she lives. This frightened her to where she wouldn't sleep or bathe. She works as a housekeeper-gets paid under the table and this is ewho brought her to the ED . Today patient denies VH since starting meds. Says she needs a simple routine to not confuse them.    Past Psychiatric History: Diagnosed 30 years ago. Has been in [facility] etc in the past. Has been followed at [facility] and now [facility]. Has been followed by Brock Bad NP for about 20 years.   Substance Abuse History: Denies and no evidence for SA.  Social History:    reports that she has been smoking Cigarettes.  She has a 40 pack-year smoking history. She has never used smokeless tobacco. She reports that she does not drink alcohol or use illicit drugs.  Family Psych History: Denies   Past Medical History:     Past Medical History  Diagnosis Date  . Hypertension   . Diabetes mellitus   . Hyperlipidemia   . Depression   . Anxiety        Past Surgical History  Procedure Date  . Breast surgery     Allergies: No Known Allergies  Current Medications:  Prior to Admission medications   Medication Sig Start Date End Date Taking? Authorizing Provider  amLODipine (NORVASC) 5 MG tablet Take 5 mg by mouth daily.      Historical Provider, MD  aspirin EC 81 MG tablet Take 81 mg by mouth daily.    Historical Provider, MD  benztropine (COGENTIN) 1 MG tablet Take 1 mg by mouth 2 (two) times daily.     Historical Provider, MD  latanoprost (XALATAN) 0.005 % ophthalmic solution Place 1 drop into both eyes at bedtime.    Historical Provider, MD  nicotine (NICODERM CQ - DOSED IN MG/24 HOURS) 21 mg/24hr patch Place 1 patch onto the skin daily.    Historical Provider, MD  olmesartan-hydrochlorothiazide (BENICAR HCT) 20-12.5 MG per tablet Take 1 tablet by mouth daily.    Historical Provider, MD  omeprazole (PRILOSEC) 20 MG capsule Take 40 mg by mouth daily.    Historical Provider, MD  perphenazine (TRILAFON) 4 MG tablet Take 1 tablet (4 mg total) by mouth at bedtime. 02/21/12 03/22/12  Celene Kras, MD  pioglitazone (ACTOS) 15 MG tablet Take 15 mg by mouth daily.    Historical Provider, MD  risperiDONE (RISPERDAL) 2 MG tablet Take 2 mg by mouth at bedtime.     Historical Provider, MD  simvastatin (ZOCOR) 20 MG tablet Take 20 mg by mouth every evening.    Historical Provider, MD  temazepam (RESTORIL) 15 MG capsule Take 1 capsule (15 mg total) by mouth at bedtime as needed for sleep. 02/21/12 03/22/12  Celene Kras, MD  traZODone (DESYREL) 50 MG tablet Take 50 mg by mouth at bedtime.    Historical Provider, MD    Mental Status Examination/Evaluation: Objective:  Appearance: Disheveled  Psychomotor Activity:  Normal  Eye Contact::  Good  Speech:  Normal  Rate  Volume:  Normal  Mood:apropriate cheerful   Affect:  Congruent  Thought Process:  Clear rational goal oriented -simplify meds   Orientation:  Full  Thought Content:  Hallucinations: Visual  Suicidal Thoughts:  No  Homicidal Thoughts:  No  Judgement:  Impaired  Insight:  Fair    DIAGNOSIS:    AXIS I Psychotic Disorder NOS  AXIS II Deferred  AXIS III See medical history. HTN DM Hyperlipidemia   AXIS IV non-compliance   AXIS V 31-40 impairment in reality testing     Treatment Plan Summary: Admit for safety & stabilization  Restart antipsychotic  Contact [facility] Care  Agree with H&P from ED

## 2012-03-02 NOTE — Progress Notes (Signed)
[facility] Group Notes:  (Counselor/Nursing/MHT/Case Management/Adjunct)  03/02/2012 11 AM  Type of Therapy:  Aftercare Planning, Group Therapy, Dance/Movement Therapy   Participation Level:  Active  Participation Quality:  Appropriate, Sharing and Supportive  Affect:  Appropriate  Cognitive:  Oriented  Insight:  Limited  Engagement in Group:  Good  Engagement in Therapy:  Limited  Modes of Intervention:  Clarification, Problem-solving, Role-play, Socialization and Support  Summary of Progress/Problems: After Care: Pt. attended and participated in aftercare planning group. Pt. Verbally accepted information on suicide prevention, warning signs to look for with suicide and crisis line numbers to use.  Counseling: pt spoke about feeling like a "cat" today because "she is sleepy and wants to rest without being bothered". Pt spoke about listing 5 positive supports to use at D/C.  Pt was very talkative and open to sharing in group. She spoke about herself, her medications and wanting to have new support systems at D/C. She stated that her current medications are making her see things and that she does not want to "see things" at D/C.  Shannon [name]

## 2012-03-02 NOTE — Progress Notes (Signed)
Pt has been pleasant and appropriate  She interacts well with others   She is suspicious at times and questions her medications  Her thinking is logical and coherent  Verbal support given   Medications administered and effectiveness monitored   Q 15 min checks   Pt safe at present

## 2012-03-02 NOTE — Progress Notes (Signed)
Patient ID: Shannon [name], female   DOB: 11-14-1950, 61 y.o.   MRN: [id number]   Reports nightmares at night, feels paranoid at times. Not sleep in her room due to high room temprature. Thinks she is getting better now. Her mood is getting better.  Mental Status Examination/Evaluation:  Objective: Appearance: Disheveled   Psychomotor Activity: Normal   Eye Contact:: Good   Speech: Normal Rate   Volume: Normal   Mood: not good  Affect: Congruent   Thought Process: Clear rational goal oriented -simplify meds   Orientation: Full   Thought Content: Hallucinations: Visual , paranoid  Suicidal Thoughts: No   Homicidal Thoughts: No   Judgement: Impaired   Insight: poor   DIAGNOSIS:  AXIS I  Psychotic Disorder NOS   AXIS II  Deferred   AXIS III  See medical history.  HTN  DM  Hyperlipidemia   AXIS IV  non-compliance   AXIS V  20   Treatment Plan Summary:   Continue current meds
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
## 2012-03-02 NOTE — Progress Notes (Signed)
[facility] Adult Inpatient Family/Significant Other Suicide Prevention Education  Suicide Prevention Education:  Education Completed; Gardiner Ramus (sister) [phone] Rexene Edison [phone] C,  (name of family member/significant other) has been identified by the patient as the family member/significant other with whom the patient will be residing, and identified as the person(s) who will aid the patient in the event of a mental health crisis (suicidal ideations/suicide attempt).  With written consent from the patient, the family member/significant other has been provided the following suicide prevention education, prior to the and/or following the discharge of the patient.  The suicide prevention education provided includes the following:  Suicide risk factors  Suicide prevention and interventions  National Suicide Hotline telephone number  [facility] assessment telephone number  [facility] Emergency Assistance 911  [facility] and/or Residential Mobile Crisis Unit telephone number  Request made of family/significant other to:  Remove weapons (e.g., guns, rifles, knives), all items previously/currently identified as safety concern.    Remove drugs/medications (over-the-counter, prescriptions, illicit drugs), all items previously/currently identified as a safety concern.  The family member/significant other verbalizes understanding of the suicide prevention education information provided.  The family member/significant other agrees to remove the items of safety concern listed above.  Pt.'s sister stated that the pt had been doing well on her medications for "some time" and just needed to get them "ok again". She had no safety concerns with the pt going home alone.  Pt. accepted information on suicide prevention, warning signs to look for with suicide and crisis line numbers to use. The pt. agreed to call crisis line numbers if having warning signs or having thoughts of suicide.     [facility] 03/02/2012, 4:30 PM

## 2012-03-03 DIAGNOSIS — F200 Paranoid schizophrenia: Principal | ICD-10-CM

## 2012-03-03 LAB — URINALYSIS, ROUTINE W REFLEX MICROSCOPIC
Bilirubin Urine: NEGATIVE
Glucose, UA: NEGATIVE mg/dL
Hgb urine dipstick: NEGATIVE
Ketones, ur: NEGATIVE mg/dL
Leukocytes, UA: NEGATIVE
Nitrite: NEGATIVE
Protein, ur: NEGATIVE mg/dL
Specific Gravity, Urine: 1.013 (ref 1.005–1.030)
Urobilinogen, UA: 0.2 mg/dL (ref 0.0–1.0)
pH: 7.5 (ref 5.0–8.0)

## 2012-03-03 LAB — DIFFERENTIAL
Basophils Absolute: 0 10*3/uL (ref 0.0–0.1)
Basophils Relative: 1 % (ref 0–1)
Eosinophils Absolute: 0.1 10*3/uL (ref 0.0–0.7)
Eosinophils Relative: 2 % (ref 0–5)
Lymphocytes Relative: 37 % (ref 12–46)
Lymphs Abs: 2.3 10*3/uL (ref 0.7–4.0)
Monocytes Absolute: 0.5 10*3/uL (ref 0.1–1.0)
Monocytes Relative: 7 % (ref 3–12)
Neutro Abs: 3.5 10*3/uL (ref 1.7–7.7)
Neutrophils Relative %: 54 % (ref 43–77)

## 2012-03-03 LAB — COMPREHENSIVE METABOLIC PANEL
ALT: 11 U/L (ref 0–35)
AST: 14 U/L (ref 0–37)
Albumin: 3.5 g/dL (ref 3.5–5.2)
Alkaline Phosphatase: 113 U/L (ref 39–117)
BUN: 7 mg/dL (ref 6–23)
CO2: 31 mEq/L (ref 19–32)
Calcium: 9.3 mg/dL (ref 8.4–10.5)
Chloride: 98 mEq/L (ref 96–112)
Creatinine, Ser: 0.67 mg/dL (ref 0.50–1.10)
GFR calc Af Amer: >90 mL/min (ref >90)
GFR calc non Af Amer: >90 mL/min (ref >90)
Glucose, Bld: 91 mg/dL (ref 70–99)
Potassium: 4 mEq/L (ref 3.5–5.1)
Sodium: 136 mEq/L (ref 135–145)
Total Bilirubin: 0.2 mg/dL — ABNORMAL LOW (ref 0.3–1.2)
Total Protein: 7.2 g/dL (ref 6.0–8.3)

## 2012-03-03 LAB — CBC
HCT: 39.6 % (ref 36.0–46.0)
Hemoglobin: 12.9 g/dL (ref 12.0–15.0)
MCH: 24.6 pg — ABNORMAL LOW (ref 26.0–34.0)
MCHC: 32.6 g/dL (ref 30.0–36.0)
MCV: 75.6 fL — ABNORMAL LOW (ref 78.0–100.0)
Platelets: 233 10*3/uL (ref 150–400)
RBC: 5.24 MIL/uL — ABNORMAL HIGH (ref 3.87–5.11)
RDW: 14.7 % (ref 11.5–15.5)
WBC: 6.4 10*3/uL (ref 4.0–10.5)

## 2012-03-03 LAB — GLUCOSE, CAPILLARY: Glucose-Capillary: 105 mg/dL — ABNORMAL HIGH (ref 70–99)

## 2012-03-03 MED ORDER — BENZTROPINE MESYLATE 1 MG PO TABS
1.0000 mg | ORAL_TABLET | ORAL | Status: DC
  Administered 2012-03-03 – 2012-03-08 (×10): 1 mg via ORAL
  Filled 2012-03-03 (×8): qty 1
  Filled 2012-03-03: qty 28
  Filled 2012-03-03 (×2): qty 1
  Filled 2012-03-03: qty 28

## 2012-03-03 MED ORDER — HYDROCHLOROTHIAZIDE 12.5 MG PO CAPS
12.5000 mg | ORAL_CAPSULE | Freq: Every day | ORAL | Status: DC
  Administered 2012-03-04 – 2012-03-08 (×5): 12.5 mg via ORAL
  Filled 2012-03-03 (×7): qty 1

## 2012-03-03 MED ORDER — IRBESARTAN 150 MG PO TABS
150.0000 mg | ORAL_TABLET | Freq: Every day | ORAL | Status: DC
  Administered 2012-03-04 – 2012-03-08 (×5): 150 mg via ORAL
  Filled 2012-03-03 (×7): qty 1

## 2012-03-03 MED ORDER — SIMVASTATIN 20 MG PO TABS
20.0000 mg | ORAL_TABLET | Freq: Every day | ORAL | Status: DC
  Administered 2012-03-03 – 2012-03-07 (×5): 20 mg via ORAL
  Filled 2012-03-03 (×4): qty 1
  Filled 2012-03-03: qty 7
  Filled 2012-03-03: qty 1

## 2012-03-03 MED ORDER — POTASSIUM CHLORIDE CRYS ER 20 MEQ PO TBCR
20.0000 meq | EXTENDED_RELEASE_TABLET | Freq: Two times a day (BID) | ORAL | Status: DC
  Administered 2012-03-03 – 2012-03-05 (×4): 20 meq via ORAL
  Filled 2012-03-03 (×5): qty 1

## 2012-03-03 MED ORDER — RISPERIDONE 3 MG PO TABS
3.0000 mg | ORAL_TABLET | Freq: Every day | ORAL | Status: DC
  Administered 2012-03-03 – 2012-03-04 (×2): 3 mg via ORAL
  Filled 2012-03-03 (×3): qty 1

## 2012-03-03 MED ORDER — PANTOPRAZOLE SODIUM 40 MG PO TBEC
40.0000 mg | DELAYED_RELEASE_TABLET | Freq: Every day | ORAL | Status: DC
  Administered 2012-03-04 – 2012-03-07 (×4): 40 mg via ORAL
  Filled 2012-03-03 (×6): qty 1
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
## 2012-03-03 NOTE — Progress Notes (Signed)
03/03/2012         Time: 00930      Group Topic/Focus: The focus of this group is on enhancing the patient's understanding of leisure, barriers to leisure, and the importance of engaging in positive leisure activities upon discharge for improved total health.  Participation Level: Active  Participation Quality: Attentive  Affect: Appropriate  Cognitive: Oriented   Additional Comments: Patient able to identify positive leisure interests.  Dorla Guizar 03/03/2012 12:35 PM 

## 2012-03-03 NOTE — Discharge Planning (Signed)
Met with patient in Aftercare Planning Group.   She reported that she does not want to stay at [facility] for her mental health services, so Case Manager explained to her the process of stopping her services at one place before we can refer to another agency.  She then said she is fine with going back to [place] as she has Database administrator and [employer] as providers, and she has known them, worked with them for many years.  Right now she only receives medication management, and she is interested in counseling being added.  Patient stated that she has to call for her transportation at [phone] one week in advance of needing help getting to a medical appointment.  This will need to be considered in setting her follow-up appointments.  Patient has an eye doctor appointment on Thursday 5/16.  She lives alone, and a friend can pick up and take her home at discharge.  Patient stated that her main goal for this hospitalization is to "get my medications right."    No case management needs expressed today.  Ambrose Mantle, LCSW 03/03/2012, 2:32 PM

## 2012-03-03 NOTE — Progress Notes (Signed)
Pt has been up and has been active while in the milieu today, attending and participating in various activities, pt did endorse auditory and visual hallucinations today and stated that she only sees them when she is in her home, pt has talked about getting her medications right, did receive all medications without incident, support provided, will continue to monitor

## 2012-03-03 NOTE — Progress Notes (Addendum)
Has been visible in day room and interacting appropriately with peers. Appears bright. Calm and cooperative with assessment. No acute distress noted. States she ha shad a good day. States she feels like she is ready for D/C, but still has concerns about the "blue dots" she has been seeing. States she feels like the medications may be contributing. Hopeful it will be resolved before she leaves. Support and encouragement provided. Did request to sleep in quiet room like she did last night. She seemed to do well last night so she will be allowed to sleep there. Denies SI/HI/AH and contracts for safety. Denies pain. POC and medications for the shift reviewed and understanding verbalized. Safety has been maintained with Q15 minute observation. Will continue current POC.

## 2012-03-03 NOTE — Progress Notes (Signed)
Pt observed vomiting x 2 in dayroom around 2130 tonight. Denies symptoms other than "indigestion - this happens to me from time to time." Pt given ginger ale which was effective. Medicated without difficulty. She is pleasant, talkative but anxious. Denies SI/HI/AVH and allowed to sleep in quiet room where she feels safer. Lawrence Marseilles

## 2012-03-03 NOTE — Progress Notes (Signed)
[facility] MD Progress Note  03/03/2012 4:47 PM  Diagnosis:  Axis I: Schizophrenia - Paranoid Type.   The patient was seen today and reports the following:   ADL's: Intact.  Sleep: The patient reports to having ongoing difficulty initiating and maintaining sleep.  Appetite: The patient reports a good appetite today.   Mild>(1-10) >Severe  Hopelessness (1-10): 6  Depression (1-10): 0  Anxiety (1-10): 6   Suicidal Ideation: The patient denies any current suicidal ideations today.  Plan: No  Intent: No  Means: No   Homicidal Ideation: The patient denies any homicidal ideations today.  Plan: No  Intent: No.  Means: No   General Appearance/Behavior: The patient was friendly and cooperative today during the evaluation with this provider.  Eye Contact: Good.  Speech: Appropriate in rate and volume with no pressuring noted today.  Motor Behavior: wnl.  Level of Consciousness: Alert and Oriented x 3.  Mental Status: Alert and Oriented x 3.  Mood: Essentially Euthymic.  Affect: Bright and Full.  Anxiety Level: Moderate anxiety reported today.  Thought Process: Paranoid delusions present.  Thought Content: The patient denies any auditory hallucinations today.  She does report visual hallucinations today as well as paranoid delusions.  Perception:. Paranoid delusions present.  Judgment: Fair.  Insight: Fair.  Cognition: Oriented to person, place and time.  Sleep:  Number of Hours: 6    Vital Signs:Blood pressure 114/77, pulse 99, temperature 98.2 F (36.8 C), temperature source Oral, resp. rate 20, height 5\' 3"  (1.6 m), weight 70.308 kg (155 lb).  Current Medications: Current Facility-Administered Medications  Medication Dose Route Frequency Provider Last Rate Last Dose  . acetaminophen (TYLENOL) tablet 650 mg  650 mg Oral Q6H PRN Verne Spurr, PA-C      . alum & mag hydroxide-simeth (MAALOX/MYLANTA) 200-200-20 MG/5ML suspension 30 mL  30 mL Oral Q4H PRN Verne Spurr, PA-C   30 mL  at 03/02/12 1717  . amLODipine (NORVASC) tablet 5 mg  5 mg Oral Daily Curlene Labrum Ahonesty Woodfin, MD   5 mg at 03/03/12 0757  . aspirin EC tablet 81 mg  81 mg Oral Daily Curlene Labrum Corrina Steffensen, MD   81 mg at 03/03/12 0757  . benztropine (COGENTIN) tablet 1 mg  1 mg Oral BH-qamhs Rebekah Sprinkle D Analeise Mccleery, MD      . irbesartan (AVAPRO) tablet 150 mg  150 mg Oral Q breakfast Curlene Labrum Remas Sobel, MD       And  . hydrochlorothiazide (MICROZIDE) capsule 12.5 mg  12.5 mg Oral Daily Shannia Jacuinde D Oriel Ojo, MD      . latanoprost (XALATAN) 0.005 % ophthalmic solution 1 drop  1 drop Both Eyes QHS Curlene Labrum Lovetta Condie, MD   1 drop at 03/02/12 2156  . magnesium hydroxide (MILK OF MAGNESIA) suspension 30 mL  30 mL Oral Daily PRN Verne Spurr, PA-C      . nicotine (NICODERM CQ - dosed in mg/24 hours) patch 21 mg  21 mg Transdermal Q24H Curlene Labrum Pierson Vantol, MD   21 mg at 03/03/12 0648  . pantoprazole (PROTONIX) EC tablet 40 mg  40 mg Oral QHS Latravious Levitt D Emmanuell Kantz, MD      . perphenazine (TRILAFON) tablet 4 mg  4 mg Oral QHS Curlene Labrum Gauri Galvao, MD   4 mg at 03/02/12 2156  . pioglitazone (ACTOS) tablet 15 mg  15 mg Oral Daily Curlene Labrum Kimberlyn Quiocho, MD   15 mg at 03/03/12 0757  . potassium chloride SA (K-DUR,KLOR-CON) CR tablet 20 mEq  20 mEq Oral  BID Curlene Labrum Maximos Zayas, MD      . risperiDONE (RISPERDAL) tablet 3 mg  3 mg Oral QHS Curlene Labrum Pallavi Clifton, MD      . simvastatin (ZOCOR) tablet 20 mg  20 mg Oral QHS Jamison Soward D Tyanne Derocher, MD      . temazepam (RESTORIL) capsule 15 mg  15 mg Oral QHS PRN Verne Spurr, PA-C      . traZODone (DESYREL) tablet 50 mg  50 mg Oral QHS Curlene Labrum Burnie Therien, MD   50 mg at 03/02/12 2156  . DISCONTD: benztropine (COGENTIN) tablet 1 mg  1 mg Oral BID Verne Spurr, PA-C   1 mg at 03/03/12 0757  . DISCONTD: hydrochlorothiazide (MICROZIDE) capsule 12.5 mg  12.5 mg Oral Daily Verne Spurr, PA-C   12.5 mg at 03/03/12 0757  . DISCONTD: irbesartan (AVAPRO) tablet 150 mg  150 mg Oral Daily Verne Spurr, PA-C   150 mg at 03/03/12 0757  . DISCONTD:  pantoprazole (PROTONIX) EC tablet 40 mg  40 mg Oral Q1200 Verne Spurr, PA-C   40 mg at 03/03/12 1314  . DISCONTD: risperiDONE (RISPERDAL) tablet 2 mg  2 mg Oral QHS Verne Spurr, PA-C   2 mg at 03/02/12 2156  . DISCONTD: simvastatin (ZOCOR) tablet 20 mg  20 mg Oral QPM Verne Spurr, PA-C   20 mg at 03/02/12 1716   Lab Results: No results found for this or any previous visit (from the past 48 hour(s)).  Review of Systems:  Neurological: The patient denies any headaches today. She denies any seizures or dizziness.  G.I.: The patient denies any constipation today or G.I. Upset.  Musculoskeletal: The patient denies any muscle or skeletal difficulties.   Time was spent today discussing with the patient her current symptoms. The patient reports to having some difficulty initiating and maintaining sleep. She reports a good appetite and denies any significant feelings of sadness, anhedonia or depressed mood.  The patient also denies any suicidal or homicidal ideations today.  She denies any auditory hallucinations today but does report visual hallucinations of seeing lights following her.  She also reports paranoid delusions of being followed when at home and thinking someone is in her home and hiding under the bed.    Treatment Plan Summary:  1. Daily contact with patient to assess and evaluate symptoms and progress in treatment.  2. Medication management  3. The patient will deny suicidal ideations or homicidal ideations for 48 hours prior to discharge and have a depression and anxiety rating of 3 or less. The patient will also deny any auditory or visual hallucinations or delusional thinking.  4. The patient will deny any symptoms of substance withdrawal at time of discharge.   Plan:  1. Will continue the patient on her current non-psychiatric medications. 2. Will continue Trilafon at  4 mgs po qhs for psychosis.  3. Will increase the medication Risperdal to 3 mgs po qhs also for psychosis.  The  patient was admitted taking 2 mgs po qhs. 4. Will continue the medication Cogentin 1 mg po q am and hs for EPS.  5. Will continue the medication Trazodone at 50 mgs po qhs for sleep..  6. Will start the medication KCL at 20 mEq po BID x 6 doses for hypokalemia. 7. Laboratory studies reviewed.  8. Will order a repeat CMP. CBC with Diff and UA as well as a TSH, Free T3 and Free T4. 9. Will continue to monitor.   Joshue Badal 03/03/2012, 4:47 PM

## 2012-03-04 LAB — TSH: TSH: 1.559 u[IU]/mL (ref 0.350–4.500)

## 2012-03-04 LAB — GLUCOSE, CAPILLARY
Glucose-Capillary: 82 mg/dL (ref 70–99)
Glucose-Capillary: 110 mg/dL — ABNORMAL HIGH (ref 70–99)

## 2012-03-04 LAB — T4, FREE: Free T4: 1.13 ng/dL (ref 0.80–1.80)

## 2012-03-04 LAB — T3, FREE: T3, Free: 2.6 pg/mL (ref 2.3–4.2)

## 2012-03-04 NOTE — Progress Notes (Signed)
In hallway on approach. Appears bright and smiles often. Calm and cooperative with assessment. No acute distress noted. Is well dressed and is wearing clothes (vs gown throughout previous day). States she has had a good day. Was visited by a friend and she enjoyed that. Friend also brought her some money which she was very thankful for. Continues to c/o nocturia/enuresis from last night and doesn't want to take the same meds she took last night. Support and encouragement provided. Encouraged to take her meds as ordered and let her know that the PA has ordered a test to see if she has a UTI. States she will think about it and Pharmacist, hospital know. Complains RU:EAVW/UJWJX dots which frighten her and BJ:YNWGNFAO voices and noises she cant distinguish. Denies Si/HI and contracts for safety. Did c/o dyspepsia and prn provided. Denies pain or discomfort. Offered no additional questions or concerns. POC and medications for the shift reviewed and understanding verbalized. Safety has been maintained with Q15 minute observation. Will f/u response to prn and continue current POC.

## 2012-03-04 NOTE — Progress Notes (Addendum)
Patient ID: Shannon [name], female   DOB: 15-Nov-1950, 61 y.o.   MRN: [id number] [facility] MD Progress Note  03/03/2012 4:47 PM  Diagnosis:  Axis I: Schizophrenia - Paranoid Type.   The patient was seen today and reports the following:   ADL's: Intact.  Sleep: The patient states she felt over sedated and wet the bed last night. Appetite: The patient reports a good appetite today.   Mild>(1-10) >Severe  Hopelessness (1-10):0 Depression (1-10): 0  Anxiety (1-10): 0  Suicidal Ideation: The patient denies any current suicidal ideations today.  Plan: No  Intent: No  Means: No   Homicidal Ideation: The patient denies any homicidal ideations today.  Plan: No  Intent: No.  Means: No   General Appearance/Behavior: The patient was friendly and cooperative today during the evaluation with this provider.  Eye Contact: Good.  Speech: Appropriate in rate and volume with no pressuring noted today.  Motor Behavior: wnl.  Level of Consciousness: Alert Mental Status: Oriented x 3.  Mood: Essentially Euthymic.  Affect: Bright and Full.  Anxiety Level: minimal anxiety noted Thought Process: Paranoid delusions present.  Thought Content: The patient denies any auditory hallucinations today.  She does report visual hallucinations today as well as paranoid delusions.  Perception:. Paranoid delusions present.  Judgment: Fair.  Insight: Fair.  Cognition: Oriented to person, place and time.  Sleep:  Number of Hours: 6    Vital Signs:BP: 106/72 , pulse 103, temp: 97.6, respiration 20 Current Medications: Current Facility-Administered Medications  Medication Dose Route Frequency Provider Last Rate Last Dose  . acetaminophen (TYLENOL) tablet 650 mg  650 mg Oral Q6H PRN Verne Spurr, PA-C      . alum & mag hydroxide-simeth (MAALOX/MYLANTA) 200-200-20 MG/5ML suspension 30 mL  30 mL Oral Q4H PRN Verne Spurr, PA-C   30 mL at 03/02/12 1717  . amLODipine (NORVASC) tablet 5 mg  5 mg Oral Daily Curlene Labrum  Readling, MD   5 mg at 03/03/12 0757  . aspirin EC tablet 81 mg  81 mg Oral Daily Curlene Labrum Readling, MD   81 mg at 03/03/12 0757  . benztropine (COGENTIN) tablet 1 mg  1 mg Oral BH-qamhs Randy D Readling, MD      . irbesartan (AVAPRO) tablet 150 mg  150 mg Oral Q breakfast Curlene Labrum Readling, MD       And  . hydrochlorothiazide (MICROZIDE) capsule 12.5 mg  12.5 mg Oral Daily Randy D Readling, MD      . latanoprost (XALATAN) 0.005 % ophthalmic solution 1 drop  1 drop Both Eyes QHS Curlene Labrum Readling, MD   1 drop at 03/02/12 2156  . magnesium hydroxide (MILK OF MAGNESIA) suspension 30 mL  30 mL Oral Daily PRN Verne Spurr, PA-C      . nicotine (NICODERM CQ - dosed in mg/24 hours) patch 21 mg  21 mg Transdermal Q24H Curlene Labrum Readling, MD   21 mg at 03/03/12 0648  . pantoprazole (PROTONIX) EC tablet 40 mg  40 mg Oral QHS Randy D Readling, MD      . perphenazine (TRILAFON) tablet 4 mg  4 mg Oral QHS Curlene Labrum Readling, MD   4 mg at 03/02/12 2156  . pioglitazone (ACTOS) tablet 15 mg  15 mg Oral Daily Curlene Labrum Readling, MD   15 mg at 03/03/12 0757  . potassium chloride SA (K-DUR,KLOR-CON) CR tablet 20 mEq  20 mEq Oral BID Ronny Bacon, MD      . risperiDONE (RISPERDAL)  tablet 3 mg  3 mg Oral QHS Curlene Labrum Readling, MD      . simvastatin (ZOCOR) tablet 20 mg  20 mg Oral QHS Randy D Readling, MD      . temazepam (RESTORIL) capsule 15 mg  15 mg Oral QHS PRN Verne Spurr, PA-C      . traZODone (DESYREL) tablet 50 mg  50 mg Oral QHS Curlene Labrum Readling, MD   50 mg at 03/02/12 2156  . DISCONTD: benztropine (COGENTIN) tablet 1 mg  1 mg Oral BID Verne Spurr, PA-C   1 mg at 03/03/12 0757  . DISCONTD: hydrochlorothiazide (MICROZIDE) capsule 12.5 mg  12.5 mg Oral Daily Verne Spurr, PA-C   12.5 mg at 03/03/12 0757  . DISCONTD: irbesartan (AVAPRO) tablet 150 mg  150 mg Oral Daily Verne Spurr, PA-C   150 mg at 03/03/12 0757  . DISCONTD: pantoprazole (PROTONIX) EC tablet 40 mg  40 mg Oral Q1200 Verne Spurr, PA-C   40  mg at 03/03/12 1314  . DISCONTD: risperiDONE (RISPERDAL) tablet 2 mg  2 mg Oral QHS Verne Spurr, PA-C   2 mg at 03/02/12 2156  . DISCONTD: simvastatin (ZOCOR) tablet 20 mg  20 mg Oral QPM Verne Spurr, PA-C   20 mg at 03/02/12 1716   Lab Results: No results found for this or any previous visit (from the past 48 hour(s).  Review of Systems:  Neurological: The patient denies any headaches today. She denies any seizures or dizziness.  G.I.: The patient denies any constipation today or G.I. Upset.  Musculoskeletal: The patient denies any muscle or skeletal difficulties.  GU: Pt. Reports enuresis last night.  Time was spent discussing with the patient her symptoms an she is somewhat anxious still about seeing the blue lights, and the clear white light.  [name] also notes that she is able to bathe now, which Is something she hadn't done for the week prior to admission.    Treatment Plan Summary:  1. Daily contact with patient to assess and evaluate symptoms and progress in treatment.  2. Medication management  3. The patient will deny suicidal ideations or homicidal ideations for 48 hours prior to discharge and have a depression and anxiety rating of 3 or less. The patient will also deny any auditory or visual hallucinations or delusional thinking.  4. The patient will deny any symptoms of substance withdrawal at time of discharge.   Plan:  1. Will continue medication as written.   2. Will evaluate the urinalysis and assess whether we need to treat empirically for UTI or decrease Risperidone secondary to urinalysis.  [name]. Jakaiden Fill PAC

## 2012-03-04 NOTE — Progress Notes (Signed)
[facility] Group Notes:  (Counselor/Nursing/MHT/Case Management/Adjunct)  03/04/2012 8:56 AM  Type of Therapy:  Group Therapy  Participation Level:  Active  Participation Quality:  Attentive and Sharing  Affect:  Appropriate  Cognitive:  Delusional  Insight:  Limited  Engagement in Group:  Good  Engagement in Therapy:  Good  Modes of Intervention:  Clarification, Education, Problem-solving and Support  Summary of Progress/Problems: Patient talked about her brother staying with her 5 days a week. She stated that he is younger but he often tells her what to do. Doesn't like this but feels he is supportive. Discussed paranoid thoughts. Okay with staying here until the first of the month when she has to pay her bills again.   HartisAram Beecham 03/04/2012, 8:56 AM

## 2012-03-04 NOTE — Discharge Planning (Signed)
Met with patient in Aftercare Planning Group.   She was feeling somewhat better, but said that she continues to see a blue light as well as a white light, and said the white light is over the top of Case Manager's head.  She is scared of that light, as she believes it is out to get her.  Utilization review done for additional days.  Ambrose Mantle, LCSW 03/04/2012, 4:25 PM

## 2012-03-04 NOTE — Progress Notes (Signed)
Patient has been up and visible in the milieu today.  Has attended some groups and is interacting well with staff and peers.  Continues to state that she sees lights and that this is sometimes scary to her.  Pleasant and cooperative with staff.  Tolerating medications well.  Going to meals, appetite good.

## 2012-03-05 LAB — GLUCOSE, CAPILLARY
Glucose-Capillary: 99 mg/dL (ref 70–99)
Glucose-Capillary: 116 mg/dL — ABNORMAL HIGH (ref 70–99)
Glucose-Capillary: 103 mg/dL — ABNORMAL HIGH (ref 70–99)

## 2012-03-05 MED ORDER — SULFAMETHOXAZOLE-TMP DS 800-160 MG PO TABS
1.0000 | ORAL_TABLET | ORAL | Status: DC
  Administered 2012-03-05 – 2012-03-08 (×6): 1 via ORAL
  Filled 2012-03-05 (×10): qty 1

## 2012-03-05 MED ORDER — SULFAMETHOXAZOLE-TRIMETHOPRIM 400-80 MG PO TABS
1.0000 | ORAL_TABLET | ORAL | Status: DC
  Filled 2012-03-05 (×2): qty 1

## 2012-03-05 MED ORDER — PERPHENAZINE 4 MG PO TABS
8.0000 mg | ORAL_TABLET | Freq: Every day | ORAL | Status: DC
  Administered 2012-03-05 – 2012-03-07 (×3): 8 mg via ORAL
  Filled 2012-03-05: qty 28
  Filled 2012-03-05 (×4): qty 2

## 2012-03-05 MED ORDER — RISPERIDONE 2 MG PO TABS
2.0000 mg | ORAL_TABLET | Freq: Every day | ORAL | Status: DC
  Administered 2012-03-05: 2 mg via ORAL
  Filled 2012-03-05 (×2): qty 1

## 2012-03-05 NOTE — Discharge Planning (Signed)
Met with patient in Aftercare Planning Group.   She reported that everything is good with her except she continues to see both a blue and white light off and on, and believes they are out to get her.  Case Manager discussed the need to try to manage that not only through medications, but also through skill-building, but she did not respond affirmatively to this.  Another patient complained that this patient has been sharing information with her family during visiting hours about other people on the hall, violating people's confidentiality.  Case Manager spoke about confidentiality again during group, and asked counselor to do the same.  Patient asked Case Manager to make 4 phone calls for her, and signed consents for all, as follows:  Employer [employer] at [phone] to tell her she cannot work right now due to hospitalization.  Transportation at [phone] to cancel tomorrow appointment to go to eye doctor.  [facility] at [phone] to cancel eye doctor scheduled for 03/06/12.  [facility] at [phone] to cancel deliver of medications tomorrow since they are likely to be different than what she is now prescribed through hospitalization.  Phone calls made as requested.  No other case management needs today.  At D/C, we need to fax new prescriptions to [facility].  Ambrose Mantle, LCSW 03/05/2012, 11:30 AM

## 2012-03-05 NOTE — Progress Notes (Signed)
Pt states she slept fair, appetite good. Pt denies SI/HI, but still believes someone is trying to hurt her. Pt needs a UA collected, will report to 2nd shift nurse. Pt going to groups, insight, fair.

## 2012-03-05 NOTE — Progress Notes (Signed)
03/05/2012         Time: 0930      Group Topic/Focus: The focus of this group is on discussing various styles of communication and communicating assertively using 'I' (feeling) statements.  Participation Level: Active  Participation Quality: Attentive  Affect: Excited  Cognitive: Alert  Additional Comments: None.   Dravin Lance 03/05/2012 11:24 AM

## 2012-03-05 NOTE — Progress Notes (Signed)
Patient is very visible on the unit. She is interacting well with staff and peers. She denies SI/HI but endorse hallucinations and paranoia. She states she is unable to sleep in her room because she thought someone would hurt her. She requests to sleep in the quiet room. Writer encouraged patient to sleep ine her room, because there is someone checking on her every fifteen minute even when she is asleep and that she is safe on the unit. EKG done as ordered. Pt received HS medications as prescribed. Xalatan missing in patient's drawer, Writer sent a note to the [facility] pharmacy.

## 2012-03-05 NOTE — Progress Notes (Signed)
[facility] MD Progress Note  03/05/2012 3:31 PM  Diagnosis:  Axis I: Schizophrenia - Paranoid Type.   The patient was seen today and reports the following:   ADL's: Intact.  Sleep: The patient reports to sleeping well at night but reports to wetting her bed for the last 2 nights.  The patient does have a UTI.  Appetite: The patient reports a good appetite today.   Mild>(1-10) >Severe  Hopelessness (1-10): 0 Depression (1-10): 0  Anxiety (1-10): 0   Suicidal Ideation: The patient denies any current suicidal ideations today.  Plan: No  Intent: No  Means: No   Homicidal Ideation: The patient denies any homicidal ideations today.  Plan: No  Intent: No.  Means: No   General Appearance/Behavior: The patient remains friendly and cooperative today during the evaluation with this provider.  Eye Contact: Good.  Speech: Appropriate in rate and volume with no pressuring noted today.  Motor Behavior: wnl.  Level of Consciousness: Alert and Oriented x 3.  Mental Status: Alert and Oriented x 3.  Mood: Essentially Euthymic.  Affect: Bright and Full.  Anxiety Level: No anxiety reported today.  Thought Process: Paranoid delusions present.  Thought Content: The patient denies any auditory hallucinations today.  She does report visual hallucinations of seeing a "blue light and a clear light following me.  She also states she feels someone "is trying to hurt me." Perception:. Paranoid delusions present.  Judgment: Fair.  Insight: Fair.  Cognition: Oriented to person, place and time.  Sleep:  Number of Hours: 4    Vital Signs:Blood pressure 127/85, pulse 86, temperature 97.8 F (36.6 C), temperature source Oral, resp. rate 18, height 5\' 3"  (1.6 m), weight 70.308 kg (155 lb).  Current Medications: Current Facility-Administered Medications  Medication Dose Route Frequency Provider Last Rate Last Dose  . acetaminophen (TYLENOL) tablet 650 mg  650 mg Oral Q6H PRN Verne Spurr, PA-C      . alum &  mag hydroxide-simeth (MAALOX/MYLANTA) 200-200-20 MG/5ML suspension 30 mL  30 mL Oral Q4H PRN Verne Spurr, PA-C   30 mL at 03/04/12 1949  . amLODipine (NORVASC) tablet 5 mg  5 mg Oral Daily Curlene Labrum Reed Dady, MD   5 mg at 03/05/12 0753  . aspirin EC tablet 81 mg  81 mg Oral Daily Curlene Labrum Cori Henningsen, MD   81 mg at 03/05/12 0752  . benztropine (COGENTIN) tablet 1 mg  1 mg Oral BH-qamhs Curlene Labrum Layann Bluett, MD   1 mg at 03/05/12 0753  . irbesartan (AVAPRO) tablet 150 mg  150 mg Oral Q breakfast Curlene Labrum Brettney Ficken, MD   150 mg at 03/05/12 0753   And  . hydrochlorothiazide (MICROZIDE) capsule 12.5 mg  12.5 mg Oral Daily Curlene Labrum Lougenia Morrissey, MD   12.5 mg at 03/05/12 0753  . latanoprost (XALATAN) 0.005 % ophthalmic solution 1 drop  1 drop Both Eyes QHS Curlene Labrum Ranisha Allaire, MD   1 drop at 03/04/12 2200  . magnesium hydroxide (MILK OF MAGNESIA) suspension 30 mL  30 mL Oral Daily PRN Verne Spurr, PA-C      . nicotine (NICODERM CQ - dosed in mg/24 hours) patch 21 mg  21 mg Transdermal Q24H Curlene Labrum Gwendolyn Mclees, MD   21 mg at 03/05/12 1610  . pantoprazole (PROTONIX) EC tablet 40 mg  40 mg Oral QHS Curlene Labrum Ami Thornsberry, MD   40 mg at 03/04/12 2201  . perphenazine (TRILAFON) tablet 8 mg  8 mg Oral QHS Ronny Bacon, MD      .  pioglitazone (ACTOS) tablet 15 mg  15 mg Oral Daily Curlene Labrum Saryn Cherry, MD   15 mg at 03/05/12 0752  . potassium chloride SA (K-DUR,KLOR-CON) CR tablet 20 mEq  20 mEq Oral BID Curlene Labrum Tyron Manetta, MD   20 mEq at 03/05/12 0753  . risperiDONE (RISPERDAL) tablet 2 mg  2 mg Oral QHS Verne Spurr, PA-C      . simvastatin (ZOCOR) tablet 20 mg  20 mg Oral QHS Curlene Labrum Maeva Dant, MD   20 mg at 03/04/12 2201  . sulfamethoxazole-trimethoprim (BACTRIM,SEPTRA) 400-80 MG per tablet 1 tablet  1 tablet Oral BH-qamhs Avyonna Wagoner D Bernardine Langworthy, MD      . traZODone (DESYREL) tablet 50 mg  50 mg Oral QHS Curlene Labrum Jerrie Gullo, MD   50 mg at 03/04/12 2201  . DISCONTD: perphenazine (TRILAFON) tablet 4 mg  4 mg Oral QHS Curlene Labrum Dhruvan Gullion, MD   4 mg  at 03/04/12 2201  . DISCONTD: risperiDONE (RISPERDAL) tablet 3 mg  3 mg Oral QHS Curlene Labrum Careem Yasui, MD   3 mg at 03/04/12 2201  . DISCONTD: temazepam (RESTORIL) capsule 15 mg  15 mg Oral QHS PRN Verne Spurr, PA-C       Lab Results:  Results for orders placed during the hospital encounter of 02/29/12 (from the past 48 hour(s))  URINALYSIS, ROUTINE W REFLEX MICROSCOPIC     Status: Normal   Collection Time   03/03/12  7:09 PM      Component Value Range Comment   Color, Urine YELLOW  YELLOW     APPearance CLEAR  CLEAR     Specific Gravity, Urine 1.013  1.005 - 1.030     pH 7.5  5.0 - 8.0     Glucose, UA NEGATIVE  NEGATIVE (mg/dL)    Hgb urine dipstick NEGATIVE  NEGATIVE     Bilirubin Urine NEGATIVE  NEGATIVE     Ketones, ur NEGATIVE  NEGATIVE (mg/dL)    Protein, ur NEGATIVE  NEGATIVE (mg/dL)    Urobilinogen, UA 0.2  0.0 - 1.0 (mg/dL)    Nitrite NEGATIVE  NEGATIVE     Leukocytes, UA NEGATIVE  NEGATIVE  MICROSCOPIC NOT DONE ON URINES WITH NEGATIVE PROTEIN, BLOOD, LEUKOCYTES, NITRITE, OR GLUCOSE <1000 mg/dL.  COMPREHENSIVE METABOLIC PANEL     Status: Abnormal   Collection Time   03/03/12  8:13 PM      Component Value Range Comment   Sodium 136  135 - 145 (mEq/L)    Potassium 4.0  3.5 - 5.1 (mEq/L)    Chloride 98  96 - 112 (mEq/L)    CO2 31  19 - 32 (mEq/L)    Glucose, Bld 91  70 - 99 (mg/dL)    BUN 7  6 - 23 (mg/dL)    Creatinine, Ser 1.91  0.50 - 1.10 (mg/dL)    Calcium 9.3  8.4 - 10.5 (mg/dL)    Total Protein 7.2  6.0 - 8.3 (g/dL)    Albumin 3.5  3.5 - 5.2 (g/dL)    AST 14  0 - 37 (U/L)    ALT 11  0 - 35 (U/L)    Alkaline Phosphatase 113  39 - 117 (U/L)    Total Bilirubin 0.2 (*) 0.3 - 1.2 (mg/dL)    GFR calc non Af Amer >90  >90 (mL/min)    GFR calc Af Amer >90  >90 (mL/min)   CBC     Status: Abnormal   Collection Time   03/03/12  8:13 PM  Component Value Range Comment   WBC 6.4  4.0 - 10.5 (K/uL)    RBC 5.24 (*) 3.87 - 5.11 (MIL/uL)    Hemoglobin 12.9  12.0 - 15.0  (g/dL)    HCT 46.9  62.9 - 52.8 (%)    MCV 75.6 (*) 78.0 - 100.0 (fL)    MCH 24.6 (*) 26.0 - 34.0 (pg)    MCHC 32.6  30.0 - 36.0 (g/dL)    RDW 41.3  24.4 - 01.0 (%)    Platelets 233  150 - 400 (K/uL)   DIFFERENTIAL     Status: Normal   Collection Time   03/03/12  8:13 PM      Component Value Range Comment   Neutrophils Relative 54  43 - 77 (%)    Neutro Abs 3.5  1.7 - 7.7 (K/uL)    Lymphocytes Relative 37  12 - 46 (%)    Lymphs Abs 2.3  0.7 - 4.0 (K/uL)    Monocytes Relative 7  3 - 12 (%)    Monocytes Absolute 0.5  0.1 - 1.0 (K/uL)    Eosinophils Relative 2  0 - 5 (%)    Eosinophils Absolute 0.1  0.0 - 0.7 (K/uL)    Basophils Relative 1  0 - 1 (%)    Basophils Absolute 0.0  0.0 - 0.1 (K/uL)   TSH     Status: Normal   Collection Time   03/03/12  8:13 PM      Component Value Range Comment   TSH 1.559  0.350 - 4.500 (uIU/mL)   T3, FREE     Status: Normal   Collection Time   03/03/12  8:13 PM      Component Value Range Comment   T3, Free 2.6  2.3 - 4.2 (pg/mL)   T4, FREE     Status: Normal   Collection Time   03/03/12  8:13 PM      Component Value Range Comment   Free T4 1.13  0.80 - 1.80 (ng/dL)   GLUCOSE, CAPILLARY     Status: Normal   Collection Time   03/04/12  6:33 AM      Component Value Range Comment   Glucose-Capillary 82  70 - 99 (mg/dL)   GLUCOSE, CAPILLARY     Status: Abnormal   Collection Time   03/04/12  5:05 PM      Component Value Range Comment   Glucose-Capillary 110 (*) 70 - 99 (mg/dL)   GLUCOSE, CAPILLARY     Status: Normal   Collection Time   03/05/12  6:01 AM      Component Value Range Comment   Glucose-Capillary 99  70 - 99 (mg/dL)   GLUCOSE, CAPILLARY     Status: Abnormal   Collection Time   03/05/12 11:58 AM      Component Value Range Comment   Glucose-Capillary 116 (*) 70 - 99 (mg/dL)    Review of Systems:  Neurological: The patient denies any headaches today. She denies any seizures or dizziness.  G.I.: The patient denies any constipation today  or G.I. Upset.  Musculoskeletal: The patient denies any muscle or skeletal difficulties.  GU:  Some enuresis reported.  Time was spent today discussing with the patient her current symptoms. The patient reports to sleeping well at night but reports to "wetting her bed" for the last 2 days.  The patient has a UTI which will be treated today.  The patient denies any significant feelings of sadness, anhedonia or depressed mood.  She denies any suicidal or homicidal ideations.  She denies any auditory hallucinations today but reports paranoid delusions as well as visual hallucinations as describe above.  The patient states that she remains afraid to return home and remains fearful that someone is trying to hurt her.  Treatment Plan Summary:  1. Daily contact with patient to assess and evaluate symptoms and progress in treatment.  2. Medication management  3. The patient will deny suicidal ideations or homicidal ideations for 48 hours prior to discharge and have a depression and anxiety rating of 3 or less. The patient will also deny any auditory or visual hallucinations or delusional thinking.  4. The patient will deny any symptoms of substance withdrawal at time of discharge.   Plan:  1. Will continue the patient on her current non-psychiatric medications. 2. Will increase the medication Trilafon to 8 mgs po qhs to further address her psychosis.  3. Will decrease the medication Risperdal to 2 mgs po qhs since the recent increase could be contributing to her enuresis. 4. Will continue the medication Cogentin 1 mg po q am and hs for EPS.  5. Will continue the medication Trazodone at 50 mgs po qhs for sleep..  6. Laboratory studies reviewed.  8. Will order a urine culture to further evaluate the patient's UTI. 9. Will start the medication Bactrim DS po q am and hs for her UTI. 10. Will continue to monitor.   Thelda Gagan 03/05/2012, 3:31 PM

## 2012-03-06 LAB — GLUCOSE, CAPILLARY
Glucose-Capillary: 101 mg/dL — ABNORMAL HIGH (ref 70–99)
Glucose-Capillary: 103 mg/dL — ABNORMAL HIGH (ref 70–99)
Glucose-Capillary: 109 mg/dL — ABNORMAL HIGH (ref 70–99)

## 2012-03-06 NOTE — Discharge Planning (Signed)
Met with patient in Aftercare Planning Group.   She stated that she believes now that the blue light she keeps seeing is actually a flashlight.  She is able to go into the bathroom today by herself for the first time, and said in group that she saw the white light "earlier this morning but not right now".  She still believes it is out to get her.  Patient checked with Case Manager to ensure phone calls were made as requested yesterday, and she was grateful for this.  She was not surprised to hear that the transportation calls were not successful.  Case Manager offered her a letter at discharge regarding the 3 20-minute phone calls which were attempted yesterday to cancel today's transportation, as we do not wish for patient to lose the transportation benefit.  Case Manager contacted [employer] [facility], left a message for intake coordinator requesting an appointment with [facility] for medication management as well as group therapy or individual counseling.  Awaiting call back.  Did utilization review for additional days.  Ambrose Mantle, LCSW 03/06/2012, 2:52 PM

## 2012-03-06 NOTE — Progress Notes (Addendum)
[facility] MD Progress Note  03/06/2012   Diagnosis:  Axis I: Schizophrenia - Paranoid Type.   The patient was seen today and reports the following:   ADL's: Intact.  Sleep: The patient reports to sleeping well at night but reports to wetting her bed 3 times last night.  She states she feels it's the medication. Alease says she is too young to start wearing Depends, and it is too expensive for her to have to buy them.  Appetite: The patient reports a good appetite today.   Mild>(1-10) >Severe  Hopelessness (1-10): 0 Depression (1-10): 0  Anxiety (1-10): 0   Suicidal Ideation: The patient denies any current suicidal ideations today.  Plan: No  Intent: No  Means: No   Homicidal Ideation: The patient denies any homicidal ideations today.  Plan: No  Intent: No.  Means: No   General Appearance/Behavior: The patient remains friendly and cooperative today during the evaluation with this provider.  Eye Contact: Good.  Speech: Appropriate in rate and volume with no pressuring noted today.  Motor Behavior: wnl.  Level of Consciousness: Alert.  Mental Status: Oriented x 3.  Mood: Essentially Euthymic.  Affect: Bright and Full.  Anxiety Level: No anxiety reported today.  Thought Process: Paranoid delusions present.  Thought Content: The patient denies any auditory hallucinations today.  She does report visual hallucinations of seeing a "blue light is following me.  She also states that she hasn't seen the clear light lately which is the one that scares her.  She notes that her hallucinations have decreased by about "half" since she arrived. Perception:. Paranoid delusions present.  Judgment: Fair.  Insight: Fair.  Cognition: Oriented to person, place and time.  Sleep:  Number of Hours: 4    Filed Vitals:   03/06/12 0803  BP: 89/55  Pulse: 106  Temp:   Resp:       Current Medications: Current Facility-Administered Medications  Medication Dose Route Frequency Provider Last Rate Last  Dose  . acetaminophen (TYLENOL) tablet 650 mg  650 mg Oral Q6H PRN Verne Spurr, PA-C      . alum & mag hydroxide-simeth (MAALOX/MYLANTA) 200-200-20 MG/5ML suspension 30 mL  30 mL Oral Q4H PRN Verne Spurr, PA-C   30 mL at 03/04/12 1949  . amLODipine (NORVASC) tablet 5 mg  5 mg Oral Daily Curlene Labrum Readling, MD   5 mg at 03/05/12 0753  . aspirin EC tablet 81 mg  81 mg Oral Daily Curlene Labrum Readling, MD   81 mg at 03/05/12 0752  . benztropine (COGENTIN) tablet 1 mg  1 mg Oral BH-qamhs Curlene Labrum Readling, MD   1 mg at 03/05/12 0753  . irbesartan (AVAPRO) tablet 150 mg  150 mg Oral Q breakfast Curlene Labrum Readling, MD   150 mg at 03/05/12 0753   And  . hydrochlorothiazide (MICROZIDE) capsule 12.5 mg  12.5 mg Oral Daily Curlene Labrum Readling, MD   12.5 mg at 03/05/12 0753  . latanoprost (XALATAN) 0.005 % ophthalmic solution 1 drop  1 drop Both Eyes QHS Curlene Labrum Readling, MD   1 drop at 03/04/12 2200  . magnesium hydroxide (MILK OF MAGNESIA) suspension 30 mL  30 mL Oral Daily PRN Verne Spurr, PA-C      . nicotine (NICODERM CQ - dosed in mg/24 hours) patch 21 mg  21 mg Transdermal Q24H Curlene Labrum Readling, MD   21 mg at 03/05/12 1610  . pantoprazole (PROTONIX) EC tablet 40 mg  40 mg Oral QHS Harvie Heck  D Readling, MD   40 mg at 03/04/12 2201  . perphenazine (TRILAFON) tablet 8 mg  8 mg Oral QHS Randy D Readling, MD      . pioglitazone (ACTOS) tablet 15 mg  15 mg Oral Daily Curlene Labrum Readling, MD   15 mg at 03/05/12 0752  . potassium chloride SA (K-DUR,KLOR-CON) CR tablet 20 mEq  20 mEq Oral BID Curlene Labrum Readling, MD   20 mEq at 03/05/12 0753  . risperiDONE (RISPERDAL) tablet 2 mg  2 mg Oral QHS Verne Spurr, PA-C      . simvastatin (ZOCOR) tablet 20 mg  20 mg Oral QHS Curlene Labrum Readling, MD   20 mg at 03/04/12 2201  . sulfamethoxazole-trimethoprim (BACTRIM,SEPTRA) 400-80 MG per tablet 1 tablet  1 tablet Oral BH-qamhs Randy D Readling, MD      . traZODone (DESYREL) tablet 50 mg  50 mg Oral QHS Curlene Labrum Readling, MD   50 mg at  03/04/12 2201  . DISCONTD: perphenazine (TRILAFON) tablet 4 mg  4 mg Oral QHS Curlene Labrum Readling, MD   4 mg at 03/04/12 2201  . DISCONTD: risperiDONE (RISPERDAL) tablet 3 mg  3 mg Oral QHS Curlene Labrum Readling, MD   3 mg at 03/04/12 2201  . DISCONTD: temazepam (RESTORIL) capsule 15 mg  15 mg Oral QHS PRN Verne Spurr, PA-C       Lab Results: None for the last 48 hours.  Review of Systems:  Neurological: The patient denies any headaches today. She denies any seizures or dizziness.  G.I.: The patient denies any constipation today or G.I. Upset.  Musculoskeletal: The patient denies any muscle or skeletal difficulties.  GU:  Some enuresis reported.  Time was spent today discussing with the patient her current symptoms. She states she is getting better, but feels that the medications are the cause of her enuresis.     Treatment Plan Summary:  1. Daily contact with patient to assess and evaluate symptoms and progress in treatment.  2. Medication management  3. The patient will deny suicidal ideations or homicidal ideations for 48 hours prior to discharge and have a depression and anxiety rating of 3 or less. The patient will also deny any auditory or visual hallucinations or delusional thinking.  4. The patient will deny any symptoms of substance withdrawal at time of discharge.   Plan:  1. Will continue the patient on her current non-psychiatric medications. 2. Will increase the medication Trilafon to 8 mgs po qhs to further address her psychosis.  3. Will discontinue the Risperidal in hopes that the enuresis will stop. 4. Will continue the medication Cogentin 1 mg po q am and hs for EPS.  5. Will continue the medication Trazodone at 50 mgs po qhs for sleep..  6. Laboratory studies reviewed.  7. Will continue to monitor.  8. If she continues to improve would consider a possible Saturday discharge.   [name]. Eddith Mentor [facility] 03/06/2012 3:31 PM

## 2012-03-06 NOTE — Progress Notes (Signed)
Patient ID: Shannon [name], female   DOB: [birth year], 61 y.o.   MRN: [id number] Patient wanted counselor to talk to her but did not have any specific she wanted to share. She asked about contact with family but when counselor asked whom she wanted contact, she stated her cousin, and then changed her mind. She stated I don't want anybody knowing my business. She stated I came here on my own and I've lived by myself for 20 years.

## 2012-03-06 NOTE — Progress Notes (Signed)
[facility] Group Notes:  (Counselor/Nursing/MHT/Case Management/Adjunct)  03/06/2012 2:05 PM  Type of Therapy:  Group Therapy  Participation Level:  Minimal  Participation Quality:  Attentive  Affect:  Appropriate  Cognitive:  Oriented  Insight:  Limited  Engagement in Group:  Limited  Engagement in Therapy:  Limited  Modes of Intervention:  Education and Support  Summary of Progress/Problems: Patient was friendly but was less engaged today. Requested that counselor talk to her individually.   Keshauna Degraffenreid, Aram Beecham 03/06/2012, 2:05 PM

## 2012-03-06 NOTE — Progress Notes (Signed)
[facility] Group Notes:  (Counselor/Nursing/MHT/Case Management/Adjunct)  03/06/2012 2:03 PM  Type of Therapy:  Psychoeducational Skills 03/05/12  Participation Level:  Active  Participation Quality:  Attentive and Sharing  Affect:  Appropriate  Cognitive:  Oriented  Insight:  Limited  Engagement in Group:  Good  Engagement in Therapy:  Good  Modes of Intervention:  Education and Support  Summary of Progress/Problems: Patient was attentive to speaker from mental health association. She asked a question after group was over but was upset that speaker had not acknowledged her during the group.   Merve Hotard, Aram Beecham 03/06/2012, 2:03 PM

## 2012-03-06 NOTE — Progress Notes (Signed)
Sleeping in own room but with over-bed light on.  Exhibiting normal sleep behavior.  Q 15 minute safety checks are being conducted.Marland Kitchen

## 2012-03-06 NOTE — Progress Notes (Signed)
Patient much brighter today.  Out in the milieu most of the shift.  Interacting well with staff and peers.  Continues to express concerns about nocturia.  Risperdal has been stopped for tonight to see how she does without it.  She is attending groups and is less paranoid today.  Denies suicidal thoughts.  Tolerating medications.

## 2012-03-06 NOTE — Progress Notes (Signed)
Reports three enuretic episodes during sleep this night.  Says, "it's my medicine."  Advised to discuss with MD today.

## 2012-03-06 NOTE — Progress Notes (Signed)
[facility] Group Notes:  (Counselor/Nursing/MHT/Case Management/Adjunct)  03/06/2012 2:01 PM  Type of Therapy:  Group Therapy  Participation Level:  Active  Participation Quality:  Attentive and Sharing  Affect:  Appropriate  Cognitive:  Oriented  Insight:  Limited  Engagement in Group:  Good  Engagement in Therapy:  Good  Modes of Intervention:  Education and Support  Summary of Progress/Problems: Patient participated in discussion about communication and roles. She stated that according to the Bible that men should rule and that the Bible is the truth. She did not relate to others as they talked about communications between family members.   Tomio Kirk, Aram Beecham 03/06/2012, 2:01 PM

## 2012-03-07 LAB — GLUCOSE, CAPILLARY
Glucose-Capillary: 98 mg/dL (ref 70–99)
Glucose-Capillary: 114 mg/dL — ABNORMAL HIGH (ref 70–99)
Glucose-Capillary: 114 mg/dL — ABNORMAL HIGH (ref 70–99)

## 2012-03-07 LAB — URINE CULTURE
Colony Count: 30000
Culture  Setup Time: 201305160202

## 2012-03-07 NOTE — Progress Notes (Signed)
Pt. Was not incontinent of urine last night.

## 2012-03-07 NOTE — Progress Notes (Signed)
Pt. Went outdoors and states she is having a good day. Pt enjoys going to church every sinday and loves to cook. No SI ior HI and contracts for safety.

## 2012-03-07 NOTE — Tx Team (Signed)
Interdisciplinary Treatment Plan Update (Adult)  Date:  03/07/2012  Time Reviewed:  10:15AM-11:15AM  Progress in Treatment: Attending groups:  Yes Participating in groups:    Yes Taking medication as prescribed:    Yes Tolerating medication:   Yes, in fact thinks that the doctor taking away the Risperdal is the reason she did not urinate in her bed last night Family/Significant other contact made:  Did not want [employer] staff to contact family Patient understands diagnosis:   Yes,  Discussing patient identified problems/goals with staff:   Yes Medical problems stabilized or resolved:   Yes Denies suicidal/homicidal ideation:  Yes Issues/concerns per patient self-inventory:   None Other:    New problem(s) identified: No, Describe:    Reason for Continuation of Hospitalization: Hallucinations Medication stabilization Other; describe Ensure she is not bed-wetting with current meds  Interventions implemented related to continuation of hospitalization:  Medication monitoring and adjustment, safety checks Q15 min., suicide risk assessment, group therapy, psychoeducation, collateral contact, aftercare planning, ongoing physician assessments, medication education  Additional comments:  Not applicable  Estimated length of stay:    Discharge Plan:  Return to her home alone, friend to pick up. Will follow up at [facility] Care, needs to arrange transportation with Medicaid transportation.  New goal(s):  Not applicable  Review of initial/current patient goals per problem list:   1.  Goal(s):  Reduce paranoia to baseline.  et:  No  Target date:  By Discharge   As evidenced by:  Very close to baseline, however, about the white light out to get her.  2.  Goal(s):  Return sleep to normal pattern of 6+ hours nightly.  Met:  No  Target date:  By Discharge   As evidenced by:  Very close to normal pattern.  Slept 5.75 hours last night and did not wet the bed.  3.  Goal(s):  Reduce  auditory/visual hallucinations to baseline.  Met:  No  Target date:  By Discharge   As evidenced by:  Very close to baseline.  Still sees the white light, but less often, and feels she is okay to go home.  4.  Goal(s):  Deny SI for 48 hours prior to D/C.  Met:  Yes  Target date:  By Discharge   As evidenced by:  Already accomplished - see last treatment team update  5. Goal(s): Reduce anxiety and hopelessness to no more than 3 at discharge.  Met: Yes Target date: By Discharge  As evidenced by: Denies significant anxiety and hopelessness today  6. Goal(s): Medication stabilization  Met: Yes Target date: By Discharge  As evidenced by:  "These meds are just right"  Attendees: Patient:  Shannon [name]  03/07/2012 10:15AM-11:15AM  Family:     Physician:  Dr. Harvie Heck Readling 03/07/2012 10:15AM-11:15AM  Nursing:   Tacy Learn, RN 03/07/2012 10:15AM -11:15AM   Case Manager:  Ambrose Mantle, LCSW 03/07/2012 10:15AM-11:15AM  Counselor:  Veto Kemps, MT-BC 03/07/2012 10:15AM-11:15AM  Other:   Verne Spurr, PA 03/07/2012 10:15AM-11:15AM  Other:      Other:      Other:       Scribe for Treatment Team:   Sarina Ser, 03/07/2012, 10:15AM-11:15AM

## 2012-03-07 NOTE — Discharge Planning (Signed)
Met with patient in Aftercare Planning Group.   She was much brighter in affect, and fluent in speech.  Patient reported that she slept all night without any bedwetting.  She asked to be able to go home today.  Treatment Team decided to give opportunity for one more night for UTI to resolve and ensure bedwetting is resolved, as well as psychosis does not return more strongly with absence of Risperdal.  Patient stated that she still sees the white light, and she thinks there is somebody in the bushes outside with a flashlight.  She feels that the light is out to get her, but she can handle that, as people have been telling her that is in her mind.    Patient is very well aware of her needs and how to get those needs met.  She states she will need to have samples at D/C, as it will take a few days for the pharmacy [facility]) to get her meds delivered.  Case Manager will not be available over the weekend, so instructions have been left for weekend staff re need for hard copy of prescriptions to be faxed to pharmacy.  [facility] was contacted by Case Manager again, and appointment for follow-up with Brock Bad, N.P. was set for 03/11/12 at 12:30pm.  At that time, the NP will complete an in-house referral for group therapy, which is being prepared for her in advance by staff.    Two letters have been placed in patient's shadow chart, to be given to her at discharge.  One is a generic letter re excusing any missed obligations during length of stay at hospital.  The other is specifically to the [place] DSS Transportation Office about the missed appointment.  No other case management needs today.  Ambrose Mantle, LCSW 03/07/2012, 12:32 PM

## 2012-03-07 NOTE — Progress Notes (Signed)
[facility] MD Progress Note  03/07/2012 6:04 PM  Diagnosis:  Axis I: Schizophrenia - Paranoid Type.   The patient was seen today and reports the following:   ADL's: Intact.  Sleep: The patient reports to sleeping well last night without any episodes of enureses. Appetite: The patient reports a good appetite today.   Mild>(1-10) >Severe  Hopelessness (1-10): 0 Depression (1-10): 0  Anxiety (1-10): 0   Suicidal Ideation: The patient adamantly denies any current suicidal ideations today.  Plan: No  Intent: No  Means: No   Homicidal Ideation: The patient adamantly denies any homicidal ideations today.  Plan: No  Intent: No.  Means: No   General Appearance/Behavior: The patient remains friendly and cooperative today during the evaluation with this provider.  Eye Contact: Good.  Speech: Appropriate in rate and volume with no pressuring noted today.  Motor Behavior: wnl.  Level of Consciousness: Alert and Oriented x 3.  Mental Status: Alert and Oriented x 3.  Mood: Essentially Euthymic.  Affect: Bright and Full.  Anxiety Level: No anxiety reported today.  Thought Process:  wnl.  Thought Content: The patient denies any auditory hallucinations today.  She does report to seeing a "blue light and a clear light" but thinks this may be related to issues with her eyes rather than visual hallucination. .  She denies any paranoid thinking today and no longer feels she is unsafe. Perception:. wnl.  Judgment: Fair to Good.  Insight: Fair to Good.  Cognition: Oriented to person, place and time.  Sleep:  Number of Hours: 5.75    Vital Signs:Blood pressure 95/59, pulse 109, temperature 98 F (36.7 C), temperature source Oral, resp. rate 16, height 5\' 3"  (1.6 m), weight 70.308 kg (155 lb).  Current Medications: Current Facility-Administered Medications  Medication Dose Route Frequency Provider Last Rate Last Dose  . acetaminophen (TYLENOL) tablet 650 mg  650 mg Oral Q6H PRN Verne Spurr, PA-C       . alum & mag hydroxide-simeth (MAALOX/MYLANTA) 200-200-20 MG/5ML suspension 30 mL  30 mL Oral Q4H PRN Verne Spurr, PA-C   30 mL at 03/06/12 1205  . amLODipine (NORVASC) tablet 5 mg  5 mg Oral Daily Curlene Labrum Tyrelle Raczka, MD   5 mg at 03/07/12 0833  . aspirin EC tablet 81 mg  81 mg Oral Daily Curlene Labrum Shaul Trautman, MD   81 mg at 03/07/12 3086  . benztropine (COGENTIN) tablet 1 mg  1 mg Oral BH-qamhs Curlene Labrum Smita Lesh, MD   1 mg at 03/07/12 0833  . irbesartan (AVAPRO) tablet 150 mg  150 mg Oral Q breakfast Curlene Labrum Baya Lentz, MD   150 mg at 03/07/12 5784   And  . hydrochlorothiazide (MICROZIDE) capsule 12.5 mg  12.5 mg Oral Daily Curlene Labrum Latiffany Harwick, MD   12.5 mg at 03/07/12 0833  . latanoprost (XALATAN) 0.005 % ophthalmic solution 1 drop  1 drop Both Eyes QHS Curlene Labrum Angelique Chevalier, MD   1 drop at 03/06/12 2125  . magnesium hydroxide (MILK OF MAGNESIA) suspension 30 mL  30 mL Oral Daily PRN Verne Spurr, PA-C      . nicotine (NICODERM CQ - dosed in mg/24 hours) patch 21 mg  21 mg Transdermal Q24H Curlene Labrum Hortence Charter, MD   21 mg at 03/07/12 1209  . pantoprazole (PROTONIX) EC tablet 40 mg  40 mg Oral QHS Curlene Labrum Jenika Chiem, MD   40 mg at 03/06/12 2126  . perphenazine (TRILAFON) tablet 8 mg  8 mg Oral QHS Javian Nudd D Kenner Lewan,  MD   8 mg at 03/06/12 2126  . pioglitazone (ACTOS) tablet 15 mg  15 mg Oral Daily Curlene Labrum Ravenne Wayment, MD   15 mg at 03/07/12 0834  . simvastatin (ZOCOR) tablet 20 mg  20 mg Oral QHS Curlene Labrum Laylee Schooley, MD   20 mg at 03/06/12 2126  . sulfamethoxazole-trimethoprim (BACTRIM DS) 800-160 MG per tablet 1 tablet  1 tablet Oral BH-qamhs Ronny Bacon, MD   1 tablet at 03/07/12 [phone]  . traZODone (DESYREL) tablet 50 mg  50 mg Oral QHS Curlene Labrum Blakely Gluth, MD   50 mg at 03/06/12 2125   Lab Results:  Results for orders placed during the hospital encounter of 02/29/12 (from the past 48 hour(s))  URINE CULTURE     Status: Normal   Collection Time   03/05/12  6:15 PM      Component Value Range Comment   Specimen  Description URINE, CLEAN CATCH      Special Requests NONE      Culture  Setup Time [id number]      Colony Count 30,000 COLONIES/ML      Culture        Value: Multiple bacterial morphotypes present, none predominant. Suggest appropriate recollection if clinically indicated.   Report Status 03/07/2012 FINAL     GLUCOSE, CAPILLARY     Status: Abnormal   Collection Time   03/06/12  5:52 AM      Component Value Range Comment   Glucose-Capillary 101 (*) 70 - 99 (mg/dL)   GLUCOSE, CAPILLARY     Status: Abnormal   Collection Time   03/06/12 11:42 AM      Component Value Range Comment   Glucose-Capillary 103 (*) 70 - 99 (mg/dL)   GLUCOSE, CAPILLARY     Status: Abnormal   Collection Time   03/06/12  5:19 PM      Component Value Range Comment   Glucose-Capillary 109 (*) 70 - 99 (mg/dL)   GLUCOSE, CAPILLARY     Status: Normal   Collection Time   03/07/12  5:47 AM      Component Value Range Comment   Glucose-Capillary 98  70 - 99 (mg/dL)   GLUCOSE, CAPILLARY     Status: Abnormal   Collection Time   03/07/12 11:55 AM      Component Value Range Comment   Glucose-Capillary 114 (*) 70 - 99 (mg/dL)    Comment 1 Notify RN      Review of Systems:  Neurological: The patient denies any headaches today. She denies any seizures or dizziness.  G.I.: The patient denies any constipation today or G.I. Upset.  Musculoskeletal: The patient denies any muscle or skeletal difficulties.  GU: No enuresis reported.  Time was spent today discussing with the patient her current symptoms. The patient reports to sleeping well last night with no episodes of "bed wetting." The patient denies any feelings of sadness, anhedonia or depressed mood.  She adamantly denies any suicidal or homicidal ideations.  She denies any auditory or visual hallucinations today as well as any delusional thinking.  She states she no longer feels in danger.  It was discussed with the patient the possibility of discharge tomorrow if she  continues to be symptoms free and she agrees.  Treatment Plan Summary:  1. Daily contact with patient to assess and evaluate symptoms and progress in treatment.  2. Medication management  3. The patient will deny suicidal ideations or homicidal ideations for 48 hours prior to discharge and have a  depression and anxiety rating of 3 or less. The patient will also deny any auditory or visual hallucinations or delusional thinking.  4. The patient will deny any symptoms of substance withdrawal at time of discharge.   Plan:  1. Will continue the patient on her current non-psychiatric medications. 2. Will continue the medication Trilafon at 8 mgs po qhs to address her psychosis.  3. Will continue the medication Cogentin 1 mg po q am and hs for EPS.  4. Will continue the medication Trazodone at 50 mgs po qhs for sleep..  5. Laboratory studies reviewed.  6. Will continue to monitor. 7. Possible discharge in the morning if she remains psychiatrically stable.   Aaronmichael Brumbaugh 03/07/2012, 6:04 PM

## 2012-03-07 NOTE — Progress Notes (Signed)
[facility] Group Notes:  (Counselor/Nursing/MHT/Case Management/Adjunct)  03/07/2012 12:25 PM  Type of Therapy:  Group Therapy  Participation Level:  Active  Participation Quality:  Attentive and Sharing  Affect:  Appropriate  Cognitive:  Oriented  Insight:  Limited  Engagement in Group:  Good  Engagement in Therapy:  Good  Modes of Intervention:  Clarification, Education and Support  Summary of Progress/Problems: Patient stated her primary goal toward wellness is to stay on her medications. She is looking forward to the weekend and being with her family for a cook-out.   HartisAram [name] 03/07/2012, 12:25 PM

## 2012-03-07 NOTE — Progress Notes (Signed)
Abd. Soft and nontender [pt stated she had a BM yesterday and just has not felt right. Pt. Did requested ensure strawberry. apears to tolerate thicker liquids. PA will evaluate pt. Again in the am. Pt. Denies running any fevers.

## 2012-03-07 NOTE — Progress Notes (Signed)
Pt. Is very pleasant and cooperative. Pt would like to be discharged today. No SI or HI and contracts for safety. Pt appears very excited today as she thinks she will be leaving. She states she wil stay on her meds so she does not have to come back to the hospital. Pt.has been attending group.

## 2012-03-07 NOTE — Progress Notes (Signed)
Pt is appropriate and pleasant   She interacts well with others and is compliant with treatment   She denies suicidal and homicidal ideation and denies auditory and visual hallucinations  Her thinking is logical and coherent   She attended karoke group and reports she enjoyed same   Verbal support given  Medications administered and effectiveness monitored  Q 15 min  Checks  Pt safe at present

## 2012-03-07 NOTE — Progress Notes (Signed)
03/07/2012  Time: 0930   Group Topic/Focus: The focus of the group is on enhancing the patients' ability to cope with stressors by understanding what coping is, why it is important, the negative effects of stress and developing healthier coping skills. Patients practice [place] and discuss how exercise can be used as a healthy coping strategy.   Participation Level:  Active  Participation Quality:  Attentive  Affect:  Excited  Cognitive:  Oriented   Additional Comments: Patient able to identify positive relaxation activities she can engage in upon discharge.   Shannon [name]  03/07/2012 11:32 AM

## 2012-03-08 LAB — GLUCOSE, CAPILLARY: Glucose-Capillary: 88 mg/dL (ref 70–99)

## 2012-03-08 MED ORDER — OLMESARTAN MEDOXOMIL-HCTZ 20-12.5 MG PO TABS: ORAL_TABLET | ORAL | Status: DC

## 2012-03-08 MED ORDER — AMLODIPINE BESYLATE 5 MG PO TABS: ORAL_TABLET | ORAL | Status: DC

## 2012-03-08 MED ORDER — PIOGLITAZONE HCL 15 MG PO TABS: ORAL_TABLET | ORAL | Status: DC

## 2012-03-08 MED ORDER — BENZTROPINE MESYLATE 1 MG PO TABS: ORAL_TABLET | ORAL | Status: DC

## 2012-03-08 MED ORDER — ASPIRIN EC 81 MG PO TBEC: DELAYED_RELEASE_TABLET | ORAL | Status: DC

## 2012-03-08 MED ORDER — RISPERIDONE 2 MG PO TABS: ORAL_TABLET | ORAL | Status: DC

## 2012-03-08 MED ORDER — TRAZODONE HCL 50 MG PO TABS: ORAL_TABLET | ORAL | Status: DC

## 2012-03-08 MED ORDER — SIMVASTATIN 20 MG PO TABS: ORAL_TABLET | ORAL | Status: DC

## 2012-03-08 MED ORDER — PERPHENAZINE 4 MG PO TABS: ORAL_TABLET | ORAL | Status: DC

## 2012-03-08 MED ORDER — OMEPRAZOLE 20 MG PO CPDR: DELAYED_RELEASE_CAPSULE | ORAL | Status: DC

## 2012-03-08 MED ORDER — NICOTINE 21 MG/24HR TD PT24: MEDICATED_PATCH | TRANSDERMAL | Status: DC

## 2012-03-08 MED ORDER — LATANOPROST 0.005 % OP SOLN: OPHTHALMIC | Status: DC

## 2012-03-08 NOTE — Progress Notes (Addendum)
[facility] Case Management Discharge Plan:  Will you be returning to the same living situation after discharge: Yes,   At discharge, do you have transportation home?: Yes, Jonna Clark May [place]-[phone] Do you have the ability to pay for your medications:Yes,    Interagency Information:     Release of information consent forms completed and in the chart;  Patient's signature needed at discharge.  Patient to Follow up at:  Follow-up Information    Follow up with Brock Bad, NP on 03/11/2012. (12:30PM appointment -- at this time, Bonita Quin will talk with you about a referral for group therapy as well)    Contact information:   [facility] [address] [place], [place] [address] Telephone:  [phone] Fax:  [phone]          Patient denies SI/HI:   Yes,      Safety Planning and Suicide Prevention discussed:  Yes,    Barrier to discharge identified:No.  Summary and Recommendations: Pt. Will follow up with appointments. Pt. refused [place] Suicide prevention information pamphlet but verbally accepted and agreed to call 911 stating that she would call 911 or contact Annye Asa, NP if she had any problems.  Pt. Was given list of free support groups and information about the [facility], located in [place].     Lamar Blinks Collins 03/08/2012, 9:45 AM

## 2012-03-08 NOTE — Progress Notes (Signed)
Patient ID: Shannon [name], female   DOB: May 05, 1951, 61 y.o.   MRN: [id number] 03-08-12 discharge note: pt denied any si/hi/av. D/c instructions were explained to her and she stated she understood her d/c instructions and f/u. She was given her scripts and sample medications. She was escorted to the lobby. She stated her friend will transport her home.

## 2012-03-08 NOTE — Progress Notes (Signed)
Patient ID: Shannon [name], female   DOB: [birth year], 61 y.o.   MRN: [id number] The patient spent most of the evening in bed. Stated that she had some abdominal gas and bloating. Was offered Maalox, but refused. Vomited a small amount of undigested food in the waste basket in her room. Denied any pain. Stated she felt better after vomiting. She is hoping to be discharged in the morning.

## 2012-03-08 NOTE — Progress Notes (Signed)
Patient ID: Shannon [name], female   DOB: 11-27-1950, 61 y.o.   MRN: [id number]  [facility] Group Notes:  (Counselor/Nursing/MHT/Case Management/Adjunct)  03/08/2012 11 AM  Type of Therapy:  Aftercare Planning, Group Therapy, Dance/Movement Therapy   Participation Level:  Minimal  Participation Quality:  Appropriate  Affect:  Excited  Cognitive:  Appropriate  Insight:  Limited  Engagement in Group:  Limited  Engagement in Therapy:  Limited  Modes of Intervention:  Clarification, Problem-solving, Role-play, Socialization and Support  Summary of Progress/Problems: After Care: Pt. attended and participated in aftercare planning group. Pt. accepted information on suicide prevention, warning signs to look for with suicide and crisis line numbers to use. The pt. agreed to call crisis line numbers if having warning signs or having thoughts of suicide. Pt. listed their current mood as feeling the color yellow "Because I am feeling good and ready to go home".  Counseling  Therapist discussed various healthy methods of support when feeling tired and exhausted.  Therapist allowed group to express how to remember healthy supports through movement.  Pt. stated that she "can cook some good food, read and follow up with my doctor".  Pt. seems to have knowledge of various supports.  Rhunette Croft

## 2012-03-08 NOTE — Progress Notes (Signed)
Pt is appropriate and pleasant  She reports looking forward to being discharged today   Her thinking is logical and coherent  She is goal directed and says she will be compliant with medications and follow up appointments   She denies suicidal and homicidal ideation and denies any auditory or visual hallucinations   Verbal support given  Medications administered and effectiveness monitored  Q 15 min checks Pt safe at present

## 2012-03-08 NOTE — Discharge Summary (Signed)
Physician Discharge Summary Note  Patient:  Shannon [name] is an 61 y.o., female MRN:  [id number] DOB:  05/01/1951 Patient phone:  [phone] (home)  Patient address:   [address] [place] [address],   Date of Admission:  02/29/2012 Date of Discharge: 03/08/2012  Reason for Admission: psychosis  Discharge Diagnoses: Principal Problem:  *Schizophrenia, paranoid type   Axis Diagnosis:   AXIS I:  Schizophrenia, paranoid type AXIS II:  Deferred AXIS III:   Past Medical History  Diagnosis Date  . Hypertension   . Diabetes mellitus   . Hyperlipidemia   . Depression   . Anxiety    AXIS IV:  occupational problems and problems with primary support group AXIS V:  51-60 moderate symptoms  Level of Care:  OP  Hospital Course:  Shannon [name] was admitted for psychosis and crisis management.  She was started on Risperidal at 3mg  and attended unit programming.  Shannon [name] was experiencing acute paranoia to the point of not being able to bathe or take care of herself.  She stated that she was seeing white, clear and blue lights which frightened her and caused her to stop going to the bathroom in her home.  She did not see the lights at other people's homes.  She reported having better sleep, but unfortunately experienced enuresis while on the 3mg  dose of Risperidal.  Urinalysis was obtained and she was treated with antibiotics for infection.  She continued to have enuresis so the Risperidal was stopped.  She did not have a return of her auditory or visual hallucinations after 24 hours and the enuresis stopped.  On the day of discharge she was in full contact with reality and had no suicidal ideation, homicidal ideation, psychosis, or depression.  She was seen by the MD and felt safe for discharge home with plans to follow up with her regular provider.  Consults:  None  Significant Diagnostic Studies:  labs: see urinalysis  Discharge Vitals:   Blood pressure 95/62, pulse 86, temperature  97.4 F (36.3 C), temperature source Oral, resp. rate 16, height 5\' 3"  (1.6 m), weight 70.308 kg (155 lb).  Mental Status Exam: See Mental Status Examination and Suicide Risk Assessment completed by Attending Physician prior to discharge.  Discharge destination:  Home  Is patient on multiple antipsychotic therapies at discharge:  No   Has Patient had three or more failed trials of antipsychotic monotherapy by history:  No  Recommended Plan for Multiple Antipsychotic Therapies: Not applicable   Discharge Orders    Future Appointments: Provider: Department: Dept Phone: Center:   03/19/2012 4:00 PM [facility] [phone]     Future Orders Please Complete By Expires   Diet - low sodium heart healthy      Increase activity slowly      Discharge instructions      Comments:   Take all medications as prescribed.  Keep all follow up appointments with your providers to make sure you get your medications refilled.     Medication List  As of 03/08/2012 10:46 AM   STOP taking these medications         temazepam 15 MG capsule         TAKE these medications      Indication    amLODipine 5 MG tablet   Commonly known as: NORVASC   Take one tablet each day for hypertension.    Indication: High Blood Pressure      aspirin EC 81 MG  tablet   Take one tablet each day for prevention of heart attacks.    Indication: Heart Attack      benztropine 1 MG tablet   Commonly known as: COGENTIN   Take one tablet by mouth 2 times a day for prevention of side effects.    Indication: Extrapyramidal Reaction caused by Medications      latanoprost 0.005 % ophthalmic solution   Commonly known as: XALATAN   One drop in each eye at bedtime for ocular hypertension.    Indication: Persistently Increased Pressure in the Eye, Wide-Angle Glaucoma      nicotine 21 mg/24hr patch   Commonly known as: NICODERM CQ - dosed in mg/24 hours   One patch each day for nicotine withdrawal.    Indication:  Nicotine Addiction      olmesartan-hydrochlorothiazide 20-12.5 MG per tablet   Commonly known as: BENICAR HCT   Take one tablet daily for hypertension.    Indication: High Blood Pressure      omeprazole 20 MG capsule   Commonly known as: PRILOSEC   Take two capsules daily for acid reflux.    Indication: Esophagus Inflammation with Erosion      perphenazine 4 MG tablet   Commonly known as: TRILAFON   Take one tablet each day at bedtime for psychosis.    Indication: Psychosis      pioglitazone 15 MG tablet   Commonly known as: ACTOS   Take one tablet daily for type ll Diabetes.    Indication: Non-Insulin-Dependent Diabetes      risperiDONE 2 MG tablet   Commonly known as: RISPERDAL   Take one tablet each day at bedtime for psychosis.    Indication: Schizophrenia      simvastatin 20 MG tablet   Commonly known as: ZOCOR   Take one tablet every evening for elevated cholesterol.    Indication: Type II B Hyperlipidemia      traZODone 50 MG tablet   Commonly known as: DESYREL   Take one tablet at bedtime for sleep.    Indication: Trouble Sleeping           Follow-up Information    Follow up with Brock Bad, NP on 03/11/2012. (12:30PM appointment -- at this time, Bonita Quin will talk with you about a referral for group therapy as well)    Contact information:   [facility] [address] [place], [place] [address] Telephone:  [phone] Fax:  [phone]          Follow-up recommendations:  Activity as tolerated, heart healthy diet.  Comments:    Signed: Lloyd Huger T. Caretha Rumbaugh PAC For Dr. Harvie Heck D. Readling 03/08/2012, 10:46 AM

## 2012-03-08 NOTE — [facility] Suicide Risk Assessment (Signed)
Suicide Risk Assessment  Discharge Assessment     Demographic factors:  Unemployed    Current Mental Status Per Nursing Assessment::   On Admission:   denied At Discharge:   denies  Current Mental Status Per Physician:  General Appearance/Behavior: The patient remains friendly and cooperative today during the evaluation with this provider.  Eye Contact: Good.  Speech: Appropriate in rate and volume with no pressuring noted today.  Motor Behavior: wnl.  Level of Consciousness: Alert and Oriented x 3.  Mental Status: Alert and Oriented x 3.  Mood: Essentially Euthymic.  Affect: Bright and Full.  Anxiety Level: No anxiety reported today.  Thought Process: wnl.  Thought Content: The patient denies any auditory hallucinations today. She does report to seeing a "blue light and a clear light" but thinks this may be related to issues with her eyes rather than visual hallucination. . She denies any paranoid thinking today and no longer feels she is unsafe. No si, no hi Perception:. wnl.  Judgment: Fair to Good.  Insight: Fair to Good.  Cognition: Oriented to person, place and time.     Loss Factors: disabled  Historical Factors: no attempts in past  Risk Reduction Factors: Risk Reduction Factors: Living with another person, especially a relative;Positive social support;Positive therapeutic relationship  Continued Clinical Symptoms:  Schizophrenia:   Paranoid or undifferentiated type  Discharge Diagnoses:   AXIS I:  Schizophrenia - Paranoid Type.   AXIS II:  Deferred AXIS III:   Past Medical History  Diagnosis Date  . Hypertension   . Diabetes mellitus   . Hyperlipidemia   . Depression   . Anxiety    AXIS IV:  other psychosocial or environmental problems AXIS V:  51-60 moderate symptoms  Cognitive Features That Contribute To Risk:  Loss of executive function    Suicide Risk:  Minimal: No identifiable suicidal ideation.  Patients presenting with no risk factors but  with morbid ruminations; may be classified as minimal risk based on the severity of the depressive symptoms  Plan Of Care/Follow-up recommendations:   [facility] was contacted by Case Manager again, and appointment for follow-up with Brock Bad, N.P. was set for 03/11/12 at 12:30pm. At that time, the NP will complete an in-house referral for group therapy, which is being prepared for her in advance by staff.   Wonda Cerise 03/08/2012, 10:48 AM

## 2012-03-11 NOTE — Progress Notes (Signed)
Patient Discharge Instructions:  After Visit Summary (AVS):   Faxed to:  03/10/2012 Psychiatric Admission Assessment Note:   Faxed to:  03/10/2012 Suicide Risk Assessment - Discharge Assessment:   Faxed to:  03/10/2012 Faxed/Sent to the Next Level Care provider:  03/10/2012  Faxed to [facility] of Care - Brock Bad, NP @ [phone]  Wandra Scot, 03/11/2012, 10:55 AM

## 2012-03-13 NOTE — Discharge Planning (Signed)
Over course of two days, with many phone calls to patient, patient's pharmacy, patient's provider, and work with the [facility] pharmacy, a 9-day supply of all her medications was gathered, placed into a pillpack form, then couriered to patient on 03/12/12.  Her insurance will pick up on 03/21/12, and Case Manager made arrangements with pharmacy to deliver on that day a full month's supply of medications.  Ambrose Mantle, LCSW 03/13/2012, 2:19 PM

## 2012-03-19 ENCOUNTER — Ambulatory Visit (HOSPITAL_COMMUNITY)
Admission: RE | Admit: 2012-03-19 | Discharge: 2012-03-19 | Disposition: A | Discharge status: Home / Self Care | Payer: PRIVATE HEALTH INSURANCE | Source: Ambulatory Visit | Attending: Internal Medicine | Admitting: Internal Medicine

## 2012-03-19 DIAGNOSIS — Z1231 Encounter for screening mammogram for malignant neoplasm of breast: Secondary | ICD-10-CM | POA: Insufficient documentation

## 2012-05-23 ENCOUNTER — Emergency Department (HOSPITAL_COMMUNITY)
Admission: EM | Admit: 2012-05-23 | Discharge: 2012-05-24 | Disposition: A | Discharge status: Other Acute Inpatient Hospital | Payer: PRIVATE HEALTH INSURANCE | Attending: Emergency Medicine | Admitting: Emergency Medicine

## 2012-05-23 ENCOUNTER — Encounter (HOSPITAL_COMMUNITY): Payer: Self-pay | Admitting: Emergency Medicine

## 2012-05-23 DIAGNOSIS — R443 Hallucinations, unspecified: Secondary | ICD-10-CM

## 2012-05-23 DIAGNOSIS — F17200 Nicotine dependence, unspecified, uncomplicated: Secondary | ICD-10-CM | POA: Insufficient documentation

## 2012-05-23 DIAGNOSIS — F209 Schizophrenia, unspecified: Secondary | ICD-10-CM | POA: Insufficient documentation

## 2012-05-23 DIAGNOSIS — E119 Type 2 diabetes mellitus without complications: Secondary | ICD-10-CM | POA: Insufficient documentation

## 2012-05-23 DIAGNOSIS — Z79899 Other long term (current) drug therapy: Secondary | ICD-10-CM | POA: Insufficient documentation

## 2012-05-23 DIAGNOSIS — I10 Essential (primary) hypertension: Secondary | ICD-10-CM | POA: Insufficient documentation

## 2012-05-23 NOTE — ED Notes (Signed)
Patient reports that she is seeing / feeling a force in her house that will try to hurt her by killing her. The patient denies any Si/Hi but is fearful in her house due to the force that is there that will knock her out. The patient reports that she has been seeing / feeling this forced since October. The patient reports that her family is aware of this change. The patient is calm and cooperative at this time. The patient was who called [employer] because she was scared for her safety in her house due to the "spirit"

## 2012-05-23 NOTE — ED Notes (Signed)
Patient reports that she is scared of her house because of the Spirit that is after her.

## 2012-05-24 ENCOUNTER — Encounter (HOSPITAL_COMMUNITY): Payer: Self-pay | Admitting: *Deleted

## 2012-05-24 ENCOUNTER — Inpatient Hospital Stay (HOSPITAL_COMMUNITY)
Admission: RE | Admit: 2012-05-24 | Discharge: 2012-05-29 | DRG: 885 | Disposition: A | Discharge status: Home / Self Care | Payer: 59 | Source: Ambulatory Visit | Attending: Psychiatry | Admitting: Psychiatry

## 2012-05-24 DIAGNOSIS — E785 Hyperlipidemia, unspecified: Secondary | ICD-10-CM | POA: Diagnosis present

## 2012-05-24 DIAGNOSIS — F200 Paranoid schizophrenia: Principal | ICD-10-CM | POA: Diagnosis present

## 2012-05-24 DIAGNOSIS — E119 Type 2 diabetes mellitus without complications: Secondary | ICD-10-CM | POA: Diagnosis present

## 2012-05-24 DIAGNOSIS — I10 Essential (primary) hypertension: Secondary | ICD-10-CM | POA: Diagnosis present

## 2012-05-24 DIAGNOSIS — Z79899 Other long term (current) drug therapy: Secondary | ICD-10-CM

## 2012-05-24 LAB — COMPREHENSIVE METABOLIC PANEL
ALT: 12 U/L (ref 0–35)
AST: 17 U/L (ref 0–37)
Albumin: 3.6 g/dL (ref 3.5–5.2)
Alkaline Phosphatase: 131 U/L — ABNORMAL HIGH (ref 39–117)
BUN: 8 mg/dL (ref 6–23)
CO2: 26 mEq/L (ref 19–32)
Calcium: 9.4 mg/dL (ref 8.4–10.5)
Chloride: 101 mEq/L (ref 96–112)
Creatinine, Ser: 0.59 mg/dL (ref 0.50–1.10)
GFR calc Af Amer: >90 mL/min (ref >90)
GFR calc non Af Amer: >90 mL/min (ref >90)
Glucose, Bld: 105 mg/dL — ABNORMAL HIGH (ref 70–99)
Potassium: 3.5 mEq/L (ref 3.5–5.1)
Sodium: 136 mEq/L (ref 135–145)
Total Bilirubin: 0.2 mg/dL — ABNORMAL LOW (ref 0.3–1.2)
Total Protein: 7.4 g/dL (ref 6.0–8.3)

## 2012-05-24 LAB — URINALYSIS, ROUTINE W REFLEX MICROSCOPIC
Bilirubin Urine: NEGATIVE
Glucose, UA: NEGATIVE mg/dL
Hgb urine dipstick: NEGATIVE
Ketones, ur: NEGATIVE mg/dL
Nitrite: NEGATIVE
Protein, ur: NEGATIVE mg/dL
Specific Gravity, Urine: 1.009 (ref 1.005–1.030)
Urobilinogen, UA: 0.2 mg/dL (ref 0.0–1.0)
pH: 6.5 (ref 5.0–8.0)

## 2012-05-24 LAB — ETHANOL: Alcohol, Ethyl (B): <11 mg/dL (ref 0–11)

## 2012-05-24 LAB — URINE MICROSCOPIC-ADD ON

## 2012-05-24 LAB — CBC
HCT: 37.1 % (ref 36.0–46.0)
Hemoglobin: 12.2 g/dL (ref 12.0–15.0)
MCH: 24.2 pg — ABNORMAL LOW (ref 26.0–34.0)
MCHC: 32.9 g/dL (ref 30.0–36.0)
MCV: 73.6 fL — ABNORMAL LOW (ref 78.0–100.0)
Platelets: 222 10*3/uL (ref 150–400)
RBC: 5.04 MIL/uL (ref 3.87–5.11)
RDW: 14.7 % (ref 11.5–15.5)
WBC: 7.2 10*3/uL (ref 4.0–10.5)

## 2012-05-24 LAB — RAPID URINE DRUG SCREEN, HOSP PERFORMED
Amphetamines: NOT DETECTED
Barbiturates: NOT DETECTED
Benzodiazepines: NOT DETECTED
Cocaine: NOT DETECTED
Opiates: NOT DETECTED
Tetrahydrocannabinol: NOT DETECTED

## 2012-05-24 LAB — ACETAMINOPHEN LEVEL: Acetaminophen (Tylenol), Serum: <15 ug/mL (ref 10–30)

## 2012-05-24 LAB — GLUCOSE, CAPILLARY
Glucose-Capillary: 88 mg/dL (ref 70–99)
Glucose-Capillary: 98 mg/dL (ref 70–99)

## 2012-05-24 MED ORDER — PANTOPRAZOLE SODIUM 40 MG PO TBEC
40.0000 mg | DELAYED_RELEASE_TABLET | Freq: Every day | ORAL | Status: DC
  Administered 2012-05-24: 40 mg via ORAL
  Filled 2012-05-24: qty 1

## 2012-05-24 MED ORDER — TRAZODONE HCL 50 MG PO TABS
50.0000 mg | ORAL_TABLET | Freq: Every day | ORAL | Status: DC
  Filled 2012-05-24: qty 1

## 2012-05-24 MED ORDER — PIOGLITAZONE HCL 15 MG PO TABS
15.0000 mg | ORAL_TABLET | Freq: Every day | ORAL | Status: DC
  Administered 2012-05-24: 15 mg via ORAL
  Filled 2012-05-24: qty 1

## 2012-05-24 MED ORDER — ACETAMINOPHEN 325 MG PO TABS: 650.0000 mg | ORAL_TABLET | ORAL | Status: DC | PRN

## 2012-05-24 MED ORDER — ALUM & MAG HYDROXIDE-SIMETH 200-200-20 MG/5ML PO SUSP: 30.0000 mL | ORAL | Status: DC | PRN

## 2012-05-24 MED ORDER — SIMVASTATIN 20 MG PO TABS
20.0000 mg | ORAL_TABLET | Freq: Every day | ORAL | Status: DC
  Administered 2012-05-24 – 2012-05-28 (×5): 20 mg via ORAL
  Filled 2012-05-24 (×4): qty 1
  Filled 2012-05-24: qty 14
  Filled 2012-05-24 (×3): qty 1

## 2012-05-24 MED ORDER — MAGNESIUM HYDROXIDE 400 MG/5ML PO SUSP: 30.0000 mL | Freq: Every day | ORAL | Status: DC | PRN

## 2012-05-24 MED ORDER — ASPIRIN EC 81 MG PO TBEC
81.0000 mg | DELAYED_RELEASE_TABLET | Freq: Every day | ORAL | Status: DC
  Administered 2012-05-24: 81 mg via ORAL
  Filled 2012-05-24: qty 1

## 2012-05-24 MED ORDER — HYDROCHLOROTHIAZIDE 12.5 MG PO CAPS
12.5000 mg | ORAL_CAPSULE | Freq: Every day | ORAL | Status: DC
  Administered 2012-05-24: 12.5 mg via ORAL
  Filled 2012-05-24: qty 1

## 2012-05-24 MED ORDER — AMLODIPINE BESYLATE 5 MG PO TABS
5.0000 mg | ORAL_TABLET | Freq: Every day | ORAL | Status: DC
  Administered 2012-05-25 – 2012-05-29 (×5): 5 mg via ORAL
  Filled 2012-05-24 (×5): qty 1
  Filled 2012-05-24: qty 14
  Filled 2012-05-24 (×2): qty 1

## 2012-05-24 MED ORDER — SIMVASTATIN 20 MG PO TABS
20.0000 mg | ORAL_TABLET | Freq: Every day | ORAL | Status: DC
  Filled 2012-05-24: qty 1

## 2012-05-24 MED ORDER — ACETAMINOPHEN 325 MG PO TABS: 650.0000 mg | ORAL_TABLET | Freq: Four times a day (QID) | ORAL | Status: DC | PRN

## 2012-05-24 MED ORDER — PANTOPRAZOLE SODIUM 40 MG PO TBEC
40.0000 mg | DELAYED_RELEASE_TABLET | Freq: Two times a day (BID) | ORAL | Status: DC
  Administered 2012-05-25 – 2012-05-29 (×9): 40 mg via ORAL
  Filled 2012-05-24: qty 1
  Filled 2012-05-24: qty 28
  Filled 2012-05-24 (×6): qty 1
  Filled 2012-05-24: qty 28
  Filled 2012-05-24 (×6): qty 1

## 2012-05-24 MED ORDER — OLMESARTAN MEDOXOMIL-HCTZ 20-12.5 MG PO TABS: 1.0000 | ORAL_TABLET | Freq: Every day | ORAL | Status: DC

## 2012-05-24 MED ORDER — HYDROCHLOROTHIAZIDE 12.5 MG PO CAPS
12.5000 mg | ORAL_CAPSULE | Freq: Every day | ORAL | Status: DC
  Administered 2012-05-25 – 2012-05-29 (×5): 12.5 mg via ORAL
  Filled 2012-05-24 (×4): qty 1
  Filled 2012-05-24: qty 14
  Filled 2012-05-24 (×2): qty 1

## 2012-05-24 MED ORDER — ASPIRIN EC 81 MG PO TBEC
81.0000 mg | DELAYED_RELEASE_TABLET | Freq: Every day | ORAL | Status: DC
  Administered 2012-05-25 – 2012-05-29 (×5): 81 mg via ORAL
  Filled 2012-05-24 (×3): qty 1
  Filled 2012-05-24: qty 14
  Filled 2012-05-24 (×4): qty 1

## 2012-05-24 MED ORDER — AMLODIPINE BESYLATE 5 MG PO TABS
5.0000 mg | ORAL_TABLET | Freq: Every day | ORAL | Status: DC
  Administered 2012-05-24: 5 mg via ORAL
  Filled 2012-05-24: qty 1

## 2012-05-24 MED ORDER — ZOLPIDEM TARTRATE 5 MG PO TABS: 5.0000 mg | ORAL_TABLET | Freq: Every evening | ORAL | Status: DC | PRN

## 2012-05-24 MED ORDER — PIOGLITAZONE HCL 15 MG PO TABS
15.0000 mg | ORAL_TABLET | Freq: Every day | ORAL | Status: DC
  Administered 2012-05-25 – 2012-05-29 (×5): 15 mg via ORAL
  Filled 2012-05-24 (×4): qty 1
  Filled 2012-05-24: qty 14
  Filled 2012-05-24 (×2): qty 1

## 2012-05-24 MED ORDER — TRAZODONE HCL 50 MG PO TABS
50.0000 mg | ORAL_TABLET | Freq: Every day | ORAL | Status: DC
  Administered 2012-05-24 – 2012-05-25 (×2): 50 mg via ORAL
  Filled 2012-05-24 (×5): qty 1

## 2012-05-24 MED ORDER — PERPHENAZINE 4 MG PO TABS
6.0000 mg | ORAL_TABLET | Freq: Every day | ORAL | Status: DC
  Filled 2012-05-24 (×2): qty 1

## 2012-05-24 MED ORDER — IRBESARTAN 150 MG PO TABS
150.0000 mg | ORAL_TABLET | Freq: Every day | ORAL | Status: DC
  Administered 2012-05-25 – 2012-05-29 (×5): 150 mg via ORAL
  Filled 2012-05-24 (×3): qty 1
  Filled 2012-05-24: qty 14
  Filled 2012-05-24 (×3): qty 1

## 2012-05-24 MED ORDER — OLMESARTAN MEDOXOMIL 20 MG PO TABS
20.0000 mg | ORAL_TABLET | Freq: Every day | ORAL | Status: DC
  Administered 2012-05-24: 20 mg via ORAL
  Filled 2012-05-24: qty 1

## 2012-05-24 MED ORDER — PERPHENAZINE 4 MG PO TABS
4.0000 mg | ORAL_TABLET | Freq: Every day | ORAL | Status: DC
  Administered 2012-05-24 – 2012-05-25 (×2): 4 mg via ORAL
  Filled 2012-05-24 (×5): qty 1

## 2012-05-24 MED ORDER — BENZTROPINE MESYLATE 1 MG PO TABS
1.0000 mg | ORAL_TABLET | Freq: Two times a day (BID) | ORAL | Status: DC
  Administered 2012-05-24 – 2012-05-26 (×5): 1 mg via ORAL
  Filled 2012-05-24 (×10): qty 1

## 2012-05-24 MED ORDER — LORAZEPAM 1 MG PO TABS
1.0000 mg | ORAL_TABLET | Freq: Three times a day (TID) | ORAL | Status: DC | PRN
  Administered 2012-05-24 (×2): 1 mg via ORAL
  Filled 2012-05-24: qty 1

## 2012-05-24 MED ORDER — ONDANSETRON HCL 4 MG PO TABS: 4.0000 mg | ORAL_TABLET | Freq: Three times a day (TID) | ORAL | Status: DC | PRN

## 2012-05-24 MED ORDER — IBUPROFEN 600 MG PO TABS: 600.0000 mg | ORAL_TABLET | Freq: Three times a day (TID) | ORAL | Status: DC | PRN

## 2012-05-24 MED ORDER — BENZTROPINE MESYLATE 1 MG PO TABS
1.0000 mg | ORAL_TABLET | Freq: Two times a day (BID) | ORAL | Status: DC
  Administered 2012-05-24: 1 mg via ORAL
  Filled 2012-05-24 (×2): qty 1

## 2012-05-24 NOTE — Progress Notes (Signed)
Patient ID: Shannon [name], female   DOB: January 22, 1951, 61 y.o.   MRN: [id number] 05-24-12 @ 1756 nursing adm note: pt came to [facility] voluntarily, endorsing auditory hallucinations and visual hallucinations, she is seeing spirits.They are in her house she stated, so she called the police department. She denies any si/hi. She has been in this facility before. She has a medical hx of hypertension, d/m, hyperlipidemia, depression and anxiety. Her cbg on admission was 98. She denied pain, allergies, drug use, etoh use and is a non smoker. She was polite/cooperative on adm then went to the cafeteria to eat. Report was given to [employer]

## 2012-05-24 NOTE — Tx Team (Signed)
Initial Interdisciplinary Treatment Plan  PATIENT STRENGTHS: (choose at least two) Average or above average intelligence Religious Affiliation Supportive family/friends  PATIENT STRESSORS: Medication change or noncompliance   PROBLEM LIST: Problem List/Patient Goals Date to be addressed Date deferred Reason deferred Estimated date of resolution  Positive for a/v hallucinations                                                       DISCHARGE CRITERIA:  Improved stabilization in mood, thinking, and/or behavior Verbal commitment to aftercare and medication compliance  PRELIMINARY DISCHARGE PLAN: Attend aftercare/continuing care group  PATIENT/FAMIILY INVOLVEMENT: This treatment plan has been presented to and reviewed with the patient, Shannon [name], and/or family member, .  The patient and family have been given the opportunity to ask questions and make suggestions.  Valente David 05/24/2012, 5:58 PM

## 2012-05-24 NOTE — BH Assessment (Signed)
Assessment Note   Shannon [name] is a 61 y.o. female who presents to [facility] voluntarily, endorsing auditory hallucinations. Pt reports she has been "sensing spirits." pt states she hears voices in her home who she believes to be ghosts and spirits. She states there are also spirits in her bathroom here in the ED, stating "I known they're there because my phone (referring to TV remote) keeps falling off the bed. She currently denies being able to see spirits, but reported to telepcyhatrist that she called police last night because she was seeing spirits. She states she has forgotten to take psychotropic medications for the past 2 weeks. She reports a history of mental health concerns including inpatient treatment at [facility] and [facility]. Most recent inpatient stay was at [facility] in 02/2012, for auditory hallucinations. She states she receives outpatient care at [facility].    Pt denies SI, HI, and SA. Pt reports she currently lives alone and can return to living situation after treatment. She states she does not feel safe to return home at this time.    Tele-Psychiatrist was consulted and recommends pt receive inpatient treatment, as pt is not psychiatrically stable. Pt appears disheveled, is oriented times 4, and is calm and cooperative.    Axis I: Psychotic Disorder NOS Axis II: Deferred Axis III:  Past Medical History  Diagnosis Date  . Hypertension   . Diabetes mellitus   . Hyperlipidemia   . Depression   . Anxiety    Axis IV: other psychosocial or environmental problems and problems related to social environment Axis V: 21-30 behavior considerably influenced by delusions or hallucinations OR serious impairment in judgment, communication OR inability to function in almost all areas  Past Medical History:  Past Medical History  Diagnosis Date  . Hypertension   . Diabetes mellitus   . Hyperlipidemia   . Depression   . Anxiety     Past Surgical History  Procedure Date  . Breast  surgery     Family History: History reviewed. No pertinent family history.  Social History:  reports that she has been smoking Cigarettes.  She has a 40 pack-year smoking history. She has never used smokeless tobacco. She reports that she does not drink alcohol or use illicit drugs.  Additional Social History:  Alcohol / Drug Use History of alcohol / drug use?: No history of alcohol / drug abuse  CIWA: CIWA-Ar BP: 141/74 mmHg Pulse Rate: 65  COWS:    Allergies: No Known Allergies  Home Medications:  (Not in a hospital admission)  OB/GYN Status:  No LMP recorded. Patient is postmenopausal.  General Assessment Data Location of Assessment: [facility] ED Living Arrangements: Alone Can pt return to current living arrangement?: Yes Admission Status: Voluntary Is patient capable of signing voluntary admission?: Yes Transfer from: Acute Hospital Referral Source: MD  Education Status Is patient currently in school?: No  Risk to self Suicidal Ideation: No Suicidal Intent: No Is patient at risk for suicide?: No Suicidal Plan?: No Access to Means: No What has been your use of drugs/alcohol within the last 12 months?: denies Previous Attempts/Gestures: No How many times?: 0  Other Self Harm Risks: none Triggers for Past Attempts: None known Intentional Self Injurious Behavior: None Family Suicide History: No Recent stressful life event(s):  (none noted) Persecutory voices/beliefs?: Yes Depression: No Depression Symptoms:  (none noted) Substance abuse history and/or treatment for substance abuse?: No Suicide prevention information given to non-admitted patients: Not applicable  Risk to Others  Homicidal Ideation: No Thoughts of Harm to Others: No Current Homicidal Intent: No Current Homicidal Plan: No Access to Homicidal Means: No Identified Victim: none History of harm to others?: No Assessment of Violence: None Noted Violent Behavior Description: cooperative Does patient  have access to weapons?: No Criminal Charges Pending?: No Does patient have a court date: No  Psychosis Hallucinations: Auditory Delusions: None noted  Mental Status Report Appear/Hygiene: Disheveled Eye Contact: Good Motor Activity: Unremarkable Speech: Logical/coherent Level of Consciousness: Alert Mood: Preoccupied Affect: Appropriate to circumstance Anxiety Level: None Thought Processes: Coherent;Relevant Judgement: Impaired Orientation: Person;Place;Time;Situation Obsessive Compulsive Thoughts/Behaviors: None  Cognitive Functioning Concentration: Normal Memory: Recent Intact;Remote Intact IQ: Average Insight: Poor Impulse Control: Poor Appetite: Good Weight Loss: 0  Weight Gain: 0  Sleep: Decreased Total Hours of Sleep: 3  Vegetative Symptoms: None  ADLScreening [facility]) Patient's cognitive ability adequate to safely complete daily activities?: Yes Patient able to express need for assistance with ADLs?: Yes Independently performs ADLs?: Yes  Abuse/Neglect [facility]) Physical Abuse: Denies Verbal Abuse: Denies Sexual Abuse: Denies  Prior Inpatient Therapy Prior Inpatient Therapy: Yes Prior Therapy Dates: [facility] Prior Therapy Facilty/Provider(s): 2013 at [facility] Reason for Treatment: auditory hallucinations  Prior Outpatient Therapy Prior Outpatient Therapy: Yes Prior Therapy Dates: ongoing Prior Therapy Facilty/Provider(s): [facility] Reason for Treatment: medication management  ADL Screening (condition at time of admission) Patient's cognitive ability adequate to safely complete daily activities?: Yes Patient able to express need for assistance with ADLs?: Yes Independently performs ADLs?: Yes Weakness of Legs: None Weakness of Arms/Hands: None  Home Assistive Devices/Equipment Home Assistive Devices/Equipment: None    Abuse/Neglect Assessment (Assessment to be complete while patient is alone) Physical Abuse:  Denies Verbal Abuse: Denies Sexual Abuse: Denies Exploitation of patient/patient's resources: Denies Self-Neglect: Denies Values / Beliefs Cultural Requests During Hospitalization: None Spiritual Requests During Hospitalization: None   Advance Directives (For Healthcare) Advance Directive: Patient does not have advance directive;Patient would not like information Pre-existing out of facility DNR order (yellow form or pink MOST form): No Nutrition Screen Diet: Regular Unintentional weight loss greater than 10lbs within the last month: No Problems chewing or swallowing foods and/or liquids: No Home Tube Feeding or Total Parenteral Nutrition (TPN): No Patient appears severely malnourished: No Pregnant or Lactating: No  Additional Information 1:1 In Past 12 Months?: No CIRT Risk: No Elopement Risk: No Does patient have medical clearance?: Yes     Disposition:  Disposition Disposition of Patient: Referred to;Inpatient treatment program Type of inpatient treatment program: Adult  On Site Evaluation by:   Reviewed with Physician:     Marjean Donna 05/24/2012 6:41 AM

## 2012-05-24 NOTE — ED Notes (Signed)
Patient to TCU report to [facility]

## 2012-05-24 NOTE — ED Provider Notes (Signed)
History     CSN: [id number]  Arrival date & time 05/23/12  2231   First MD Initiated Contact with Patient 05/23/12 2356      Chief Complaint  Patient presents with  . Hallucinations  . Medical Clearance    (Consider location/radiation/quality/duration/timing/severity/associated sxs/prior treatment) The history is provided by the patient.   61 year old female with history of schizophrenia states that she had onset this evening of sensation that there were status the house. She states it. C4 level it and then she didn't have anything defend herself with. If she had no actual visual hallucinations but she could hear things being moved upstairs in her house. She thinks she may have forgotten to take her medication even if she was trying to take it as prescribed. Denies depression, suicidal ideation, homicidal ideation.  Past Medical History  Diagnosis Date  . Hypertension   . Diabetes mellitus   . Hyperlipidemia   . Depression   . Anxiety     Past Surgical History  Procedure Date  . Breast surgery     History reviewed. No pertinent family history.  History  Substance Use Topics  . Smoking status: Current Everyday Smoker -- 1.0 packs/day for 40 years    Types: Cigarettes  . Smokeless tobacco: Never Used  . Alcohol Use: No    OB History    Grav Para Term Preterm Abortions TAB SAB Ect Mult Living   0 0 0 0 0 0 0 0 0 0       Review of Systems  All other systems reviewed and are negative.    Allergies  Review of patient's allergies indicates no known allergies.  Home Medications   Current Outpatient Rx  Name Route Sig Dispense Refill  . AMLODIPINE BESYLATE 5 MG PO TABS  Take one tablet each day for hypertension. 30 tablet 0  . ASPIRIN EC 81 MG PO TBEC  Take one tablet each day for prevention of heart attacks.    Marland Kitchen BENZTROPINE MESYLATE 1 MG PO TABS  Take one tablet by mouth 2 times a day for prevention of side effects. 60 tablet 0  . OLMESARTAN MEDOXOMIL-HCTZ  20-12.5 MG PO TABS  Take one tablet daily for hypertension. 30 tablet 0  . OMEPRAZOLE 20 MG PO CPDR  Take two capsules daily for acid reflux. 60 capsule 0  . PERPHENAZINE 4 MG PO TABS  Take one tablet each day at bedtime for psychosis. 30 tablet 0  . PIOGLITAZONE HCL 15 MG PO TABS  Take one tablet daily for type ll Diabetes. 30 tablet 0  . SIMVASTATIN 20 MG PO TABS  Take one tablet every evening for elevated cholesterol. 30 tablet 0  . TRAZODONE HCL 50 MG PO TABS  Take one tablet at bedtime for sleep. 30 tablet 0    BP 135/79  Pulse 85  Temp 98.5 F (36.9 C) (Oral)  Resp 18  SpO2 99%  Physical Exam  Nursing note and vitals reviewed.  61 year old female who is resting comfortably and in no acute distress. Vital signs are normal. Head is normocephalic and atraumatic. PERRLA, EOMI. Neck is nontender and supple. Back is nontender. Lungs are clear without rales, wheezes, rhonchi. Heart has regular rate and rhythm without murmur. Abdomen is soft, flat, nontender without masses or hepatosplenomegaly. Extremities have full range of motion, no cyanosis or edema. Skin is warm and dry without rash. Neurologic: She is awake, alert, oriented. Cranial nerves are intact. There no motor or sensory deficits. Psychiatric: She is  somewhat anxious about her hallucinations but her interactions with me are appropriate. Affect appears normal.  ED Course  Procedures (including critical care time)  Results for orders placed during the hospital encounter of 05/23/12  CBC      Component Value Range   WBC 7.2  4.0 - 10.5 K/uL   RBC 5.04  3.87 - 5.11 MIL/uL   Hemoglobin 12.2  12.0 - 15.0 g/dL   HCT 16.1  09.6 - 04.5 %   MCV 73.6 (*) 78.0 - 100.0 fL   MCH 24.2 (*) 26.0 - 34.0 pg   MCHC 32.9  30.0 - 36.0 g/dL   RDW 40.9  81.1 - 91.4 %   Platelets 222  150 - 400 K/uL  COMPREHENSIVE METABOLIC PANEL      Component Value Range   Sodium 136  135 - 145 mEq/L   Potassium 3.5  3.5 - 5.1 mEq/L   Chloride 101  96 - 112  mEq/L   CO2 26  19 - 32 mEq/L   Glucose, Bld 105 (*) 70 - 99 mg/dL   BUN 8  6 - 23 mg/dL   Creatinine, Ser 7.82  0.50 - 1.10 mg/dL   Calcium 9.4  8.4 - 95.6 mg/dL   Total Protein 7.4  6.0 - 8.3 g/dL   Albumin 3.6  3.5 - 5.2 g/dL   AST 17  0 - 37 U/L   ALT 12  0 - 35 U/L   Alkaline Phosphatase 131 (*) 39 - 117 U/L   Total Bilirubin 0.2 (*) 0.3 - 1.2 mg/dL   GFR calc non Af Amer >90  >90 mL/min   GFR calc Af Amer >90  >90 mL/min  ETHANOL      Component Value Range   Alcohol, Ethyl (B) <11  0 - 11 mg/dL  ACETAMINOPHEN LEVEL      Component Value Range   Acetaminophen (Tylenol), Serum <15.0  10 - 30 ug/mL  URINE RAPID DRUG SCREEN (HOSP PERFORMED)      Component Value Range   Opiates NONE DETECTED  NONE DETECTED   Cocaine NONE DETECTED  NONE DETECTED   Benzodiazepines NONE DETECTED  NONE DETECTED   Amphetamines NONE DETECTED  NONE DETECTED   Tetrahydrocannabinol NONE DETECTED  NONE DETECTED   Barbiturates NONE DETECTED  NONE DETECTED  URINALYSIS, ROUTINE W REFLEX MICROSCOPIC      Component Value Range   Color, Urine YELLOW  YELLOW   APPearance CLEAR  CLEAR   Specific Gravity, Urine 1.009  1.005 - 1.030   pH 6.5  5.0 - 8.0   Glucose, UA NEGATIVE  NEGATIVE mg/dL   Hgb urine dipstick NEGATIVE  NEGATIVE   Bilirubin Urine NEGATIVE  NEGATIVE   Ketones, ur NEGATIVE  NEGATIVE mg/dL   Protein, ur NEGATIVE  NEGATIVE mg/dL   Urobilinogen, UA 0.2  0.0 - 1.0 mg/dL   Nitrite NEGATIVE  NEGATIVE   Leukocytes, UA SMALL (*) NEGATIVE  URINE MICROSCOPIC-ADD ON      Component Value Range   Squamous Epithelial / LPF RARE  RARE   WBC, UA 3-6  <3 WBC/hpf   RBC / HPF 0-2  <3 RBC/hpf   Bacteria, UA FEW (*) RARE    1. Schizophrenia   2. Hallucinations       MDM  Exacerbation of schizophrenia oh which may be related to medication noncompliance. Psychiatric consultation will be obtained.  Psychiatry consultation is appreciated. Petras recommends inpatient psychiatric care and does not offer  any suggestions for  changing medication regimen.  Dione Booze, MD 05/24/12 [phone]

## 2012-05-24 NOTE — ED Notes (Addendum)
Pt . Belonging are in locker 31 in [place]

## 2012-05-24 NOTE — ED Notes (Signed)
Per ACT CSW pt has been accepted to [facility] by Jennette Kettle to [address] [id number]. ACT csw is with pt now.

## 2012-05-24 NOTE — Progress Notes (Signed)
Writer entered patients room and observed her coming out of the bathroom. Writer spoke with patient briefly concerning her medications. Patient appeared anxious and was closing her curtains as we talked. She reported that she had taken all her medications except her ones at nighttime. Patient currently denies having pain and reported that she needed to get back to the meeting which she was referring to group. Patient was pleasant and cooperative, support and encouragement offered. Safety maintained on unit, will continue to monitor.

## 2012-05-25 DIAGNOSIS — F200 Paranoid schizophrenia: Principal | ICD-10-CM

## 2012-05-25 NOTE — [facility] Suicide Risk Assessment (Signed)
Suicide Risk Assessment  Admission Assessment     Demographic factors:  Assessment Details Time of Assessment: Admission Information Obtained From: Patient Current Mental Status:  Current Mental Status:  (denies si/hi), disoraganized Loss Factors:  Loss Factors: Decrease in vocational status Historical Factors:  Historical Factors: Family history of mental illness or substance abuse Risk Reduction Factors:  Risk Reduction Factors: Sense of responsibility to family;Religious beliefs about death;Positive social support  CLINICAL FACTORS:   Schizophrenia:   Paranoid or undifferentiated type  COGNITIVE FEATURES THAT CONTRIBUTE TO RISK:  Loss of executive function    SUICIDE RISK:   Mild:  Suicidal ideation of limited frequency, intensity, duration, and specificity.  There are no identifiable plans, no associated intent, mild dysphoria and related symptoms, good self-control (both objective and subjective assessment), few other risk factors, and identifiable protective factors, including available and accessible social support.  PLAN OF CARE:  Axis I: Psychotic Disorder NOS, non compliance with treatment Axis II: Deferred  Axis III:  Past Medical History   Diagnosis  Date   .  Hypertension    .  Diabetes mellitus    .  Hyperlipidemia    .  Depression    .  Anxiety     Axis IV: other psychosocial or environmental problems and problems related to social environment  Axis V: 21-30 behavior considerably influenced by delusions or hallucinations OR serious impairment in judgment, communication OR inability to function in almost all areas  Plan:  Continue current meds  Wonda Cerise 05/25/2012, 12:57 PM

## 2012-05-25 NOTE — Progress Notes (Signed)
[facility] Group Notes:  (Counselor/Nursing/MHT/Case Management/Adjunct)  05/25/2012 11 AM  Type of Therapy:  Aftercare Planning, Group Therapy, Dance/Movement Therapy   Participation Level:  Active  Participation Quality:  Attentive, Redirectable and Sharing  Affect:  Appropriate  Cognitive:  Alert  Insight:  Good  Engagement in Group:  Limited  Engagement in Therapy:  Limited  Modes of Intervention:  Clarification, Problem-solving, Role-play, Socialization and Support  Summary of Progress/Problems: After Care: Pt. attended and participated in aftercare planning group. Pt. accepted information on suicide prevention, warning signs to look for with suicide and crisis line numbers to use. The pt. agreed to call crisis line numbers if having warning signs or having thoughts of suicide. Pt. stated that last night she "did not sleep" for it was her first night at [facility] and that she would like help being able to sleep at home for she does not sleep well there either. She spoke about needing help with remembering to take her medications. Counseling:  Pt participated in a group discussion focused on "just for today" daily readings. Pt listened to several goals/resolutions and agreed to pick one to focus on today. She stated that she  Would try to only tackle one problem at a time today and that today she would focus on "not being afraid at home". Pt spoke about believing that there are "spirts" at her home.  [facility] 05/25/2012. 11:40 AM

## 2012-05-25 NOTE — Progress Notes (Addendum)
[facility] Adult Inpatient Family/Significant Other Suicide Prevention Education  Suicide Prevention Education:  Education Completed;  Janean Sark [phone], C [id number] [phone] has been identified by the patient as the family member/significant other with whom the patient will be residing, and identified as the person(s) who will aid the patient in the event of a mental health crisis (suicidal ideations/suicide attempt).  With written consent from the patient, the family member/significant other has been provided the following suicide prevention education, prior to the and/or following the discharge of the patient.  The suicide prevention education provided includes the following:  Suicide risk factors  Suicide prevention and interventions  National Suicide Hotline telephone number  [facility] assessment telephone number  [facility] Emergency Assistance 911  [facility] and/or Residential Mobile Crisis Unit telephone number  Request made of family/significant other to:  Remove weapons (e.g., guns, rifles, knives), all items previously/currently identified as safety concern.    Remove drugs/medications (over-the-counter, prescriptions, illicit drugs), all items previously/currently identified as a safety concern.  The family member/significant other verbalizes understanding of the suicide prevention education information provided.  The family member/significant other agrees to remove the items of safety concern listed above.  Pt. accepted information on suicide prevention, warning signs to look for with suicide and crisis line numbers to use. The pt. agreed to call crisis line numbers if having warning signs or having thoughts of suicide. Sister was wondering why the pt was "acting strange" and did not know the pt was not taking her medications. She would like to see the pt have an ACT team and perhaps have someone to live with her so that she is not alone. Sister agreed to  "think on it" as to who the pt could live with.     [facility] 05/25/2012, 5:07 PM

## 2012-05-25 NOTE — Progress Notes (Signed)
Patient has been up and active on the unit. Patient did c/o being sleepy because her roommate kept her up most of the night and wanted her medications so she could lie down. Patient received her hs medications and was offered to sleep in the quiet room if needed. Patient declined and was informed that if she changed her mind she could sleep there. Patient appears anxious but is pleasant and cooperative. Positive for auditory and visual hallucinations, sees spirits and shadows. Safety maintained on unit, will continue to monitor.

## 2012-05-25 NOTE — Progress Notes (Signed)
Adult Psychosocial Assessment Update Interdisciplinary Team  Previous [facility] admissions/discharges:  Admissions Discharges  Date:02-29-12 Date:03-08-12  Date: Date:  Date: Date:  Date: Date:  Date: Date:   Changes since the last Psychosocial Assessment (including adherence to outpatient mental health and/or substance abuse treatment, situational issues contributing to decompensation and/or relapse).   Pt is worried about "spirts" in her home and reported that she "forgot" to take her medications. Pt is asking for help in remembering to take her medications and to move to another apt esp one downstairs (the stairs are difficult for her to manage). Pt reports that she was able to stay away from smoking cigarettes since her D/C but stopped going to her therapy groups because "they smoke there". Pt stated she knew she needed help but did not go to groups or reach out for help and that she needs a "case worker" to "come to her home".             Discharge Plan 1. Will you be returning to the same living situation after discharge?   Yes:X No:      If no, what is your plan?    Pt would like to move to another unit if possible       2. Would you like a referral for services when you are discharged? Yes:  X   If yes, for what services?  No:       Would like to work with an ACT team or "case worker that can come to the home"       Summary and Recommendations (to be completed by the evaluator) Pt spoke about feeling afraid at home and feeling like there is a "spirt" at her home. She spoke about "forgettting" to take her medications and knowing she needs them but needing someone to remind her. Pt would like ACT team help at D/C.   Recommendations include crisis stabilization, case management, medication management, psycho-education groups to teach coping skills and group therapy.                      Signature:  Gevena Mart, 05/25/2012 11:23 AM

## 2012-05-25 NOTE — H&P (Signed)
Psychiatric Admission Assessment Adult  Patient Identification:  Shannon [name] Date of Evaluation:  05/25/2012 Chief Complaint:  PSYCHOTIC D/O,NOS History of Present Illness: This is a voluntary admission for [name] who is well known to [facility]. She presented to the ED at [facility] reporting that she is seeing spirits in her house again and even if she missed "1or2" doses of medication, she was too nervous to stay there alone.  Past Psychiatric History: Diagnosis:  Paranoid schizophrenia  Hospitalizations: multiple  Outpatient Care:  Substance Abuse Care:  Self-Mutilation:  Suicidal Attempts:  Violent Behaviors:   Past Medical History:   Past Medical History  Diagnosis Date  . Hypertension   . Diabetes mellitus   . Hyperlipidemia   . Depression   . Anxiety    Allergies:  No Known Allergies PTA Medications: Prescriptions prior to admission  Medication Sig Dispense Refill  . amLODipine (NORVASC) 5 MG tablet Take one tablet each day for hypertension.  30 tablet  0  . aspirin EC 81 MG tablet Take one tablet each day for prevention of heart attacks.      . benztropine (COGENTIN) 1 MG tablet Take one tablet by mouth 2 times a day for prevention of side effects.  60 tablet  0  . olmesartan-hydrochlorothiazide (BENICAR HCT) 20-12.5 MG per tablet Take one tablet daily for hypertension.  30 tablet  0  . omeprazole (PRILOSEC) 20 MG capsule Take two capsules daily for acid reflux.  60 capsule  0  . perphenazine (TRILAFON) 4 MG tablet Take one tablet each day at bedtime for psychosis.  30 tablet  0  . pioglitazone (ACTOS) 15 MG tablet Take one tablet daily for type ll Diabetes.  30 tablet  0  . simvastatin (ZOCOR) 20 MG tablet Take one tablet every evening for elevated cholesterol.  30 tablet  0  . traZODone (DESYREL) 50 MG tablet Take one tablet at bedtime for sleep.  30 tablet  0    Previous Psychotropic Medications: see abpve Substance Abuse History in the last 12 months: not  applicable Substance Age of 1st Use Last Use Amount Specific Type  Nicotine      Alcohol      Cannabis      Opiates      Cocaine      Methamphetamines      LSD      Ecstasy      Benzodiazepines      Caffeine      Inhalants      Others:                         Consequences of Substance Abuse:Not applicable   Social History: Current Place of Residence:   Place of Birth:   Family Members: Marital Status:   Children:  Sons:  Daughters: Relationships: Education:   Educational Problems/Performance: Religious Beliefs/Practices: History of Abuse (Emotional/Phsycial/Sexual) Occupational Experiences; Military History:  Legal History: Hobbies/Interests:  Family History:  History reviewed. No pertinent family history. ROS: Negative with the exception of the HPI. PE: Completed by the MD in the ED. Mental Status Examination/Evaluation: Objective:  Appearance: disheveled  Eye Contact::  good  Speech:  clear  Volume:  normal  Mood:  euthymic  Affect: congruent  Thought Process:  linear  Orientation:  x3  Thought Content:  Auditory and visual hallucinations  Suicidal Thoughts:  no  Homicidal Thoughts:  no  Memory:  fair  Judgement:  impaired  Insight:  lacking  Psychomotor Activity:  normal  Concentration:  fair  Recall:  poor  Akathisia:  no  Handed:    AIMS (if indicated):     Assets:  Housing,   Sleep:  Number of Hours: 3     Laboratory/X-Ray Psychological Evaluation(s)   UDS-negative  BAL-<11    Assessment:    AXIS I:  Schizophrenia paranoid type AXIS II:  deferred AXIS III:   Past Medical History  Diagnosis Date  . Hypertension   . Diabetes mellitus   . Hyperlipidemia   . Depression   . Anxiety   AXIS IV:   AXIS V:  GAF 50  Treatment Plan/Recommendations:  Treatment Plan Summary: 1. Admit for crisis management and stabilization. 2. Restart home medications. 3. Treat health problems as needed 4. Treat with medication management to a goal  of returning the patient to a baseline level of functioning upon discharge with improved stability. Current Medications:  Current Facility-Administered Medications  Medication Dose Route Frequency Provider Last Rate Last Dose  . acetaminophen (TYLENOL) tablet 650 mg  650 mg Oral Q6H PRN Verne Spurr, PA-C      . alum & mag hydroxide-simeth (MAALOX/MYLANTA) 200-200-20 MG/5ML suspension 30 mL  30 mL Oral Q4H PRN Verne Spurr, PA-C      . amLODipine (NORVASC) tablet 5 mg  5 mg Oral Daily Verne Spurr, PA-C   5 mg at 05/25/12 0819  . aspirin EC tablet 81 mg  81 mg Oral Daily Verne Spurr, PA-C   81 mg at 05/25/12 0818  . benztropine (COGENTIN) tablet 1 mg  1 mg Oral BID Verne Spurr, PA-C   1 mg at 05/25/12 0818  . hydrochlorothiazide (MICROZIDE) capsule 12.5 mg  12.5 mg Oral Daily Verne Spurr, PA-C   12.5 mg at 05/25/12 0818  . irbesartan (AVAPRO) tablet 150 mg  150 mg Oral Daily Verne Spurr, PA-C   150 mg at 05/25/12 0819  . magnesium hydroxide (MILK OF MAGNESIA) suspension 30 mL  30 mL Oral Daily PRN Verne Spurr, PA-C      . pantoprazole (PROTONIX) EC tablet 40 mg  40 mg Oral BID AC Verne Spurr, PA-C   40 mg at 05/25/12 1191  . perphenazine (TRILAFON) tablet 4 mg  4 mg Oral QHS Verne Spurr, PA-C   4 mg at 05/24/12 2133  . pioglitazone (ACTOS) tablet 15 mg  15 mg Oral Daily Verne Spurr, PA-C   15 mg at 05/25/12 0818  . simvastatin (ZOCOR) tablet 20 mg  20 mg Oral QHS Verne Spurr, PA-C   20 mg at 05/24/12 2133  . traZODone (DESYREL) tablet 50 mg  50 mg Oral QHS Verne Spurr, PA-C   50 mg at 05/24/12 2133   Facility-Administered Medications Ordered in Other Encounters  Medication Dose Route Frequency Provider Last Rate Last Dose  . DISCONTD: acetaminophen (TYLENOL) tablet 650 mg  650 mg Oral Q4H PRN Dione Booze, MD      . DISCONTD: alum & mag hydroxide-simeth (MAALOX/MYLANTA) 200-200-20 MG/5ML suspension 30 mL  30 mL Oral PRN Dione Booze, MD      . DISCONTD: amLODipine (NORVASC)  tablet 5 mg  5 mg Oral Daily Dione Booze, MD   5 mg at 05/24/12 1041  . DISCONTD: aspirin EC tablet 81 mg  81 mg Oral Daily Dione Booze, MD   81 mg at 05/24/12 1042  . DISCONTD: benztropine (COGENTIN) tablet 1 mg  1 mg Oral BID Dione Booze, MD   1 mg at 05/24/12 1043  . DISCONTD:  hydrochlorothiazide (MICROZIDE) capsule 12.5 mg  12.5 mg Oral Daily Dione Booze, MD   12.5 mg at 05/24/12 1043  . DISCONTD: ibuprofen (ADVIL,MOTRIN) tablet 600 mg  600 mg Oral Q8H PRN Dione Booze, MD      . DISCONTD: LORazepam (ATIVAN) tablet 1 mg  1 mg Oral Q8H PRN Dione Booze, MD   1 mg at 05/24/12 1046  . DISCONTD: olmesartan (BENICAR) tablet 20 mg  20 mg Oral Daily Dione Booze, MD   20 mg at 05/24/12 1045  . DISCONTD: ondansetron (ZOFRAN) tablet 4 mg  4 mg Oral Q8H PRN Dione Booze, MD      . DISCONTD: pantoprazole (PROTONIX) EC tablet 40 mg  40 mg Oral Q1200 Dione Booze, MD   40 mg at 05/24/12 1239  . DISCONTD: perphenazine (TRILAFON) tablet 6 mg  6 mg Oral QHS Dione Booze, MD      . DISCONTD: pioglitazone (ACTOS) tablet 15 mg  15 mg Oral Daily Dione Booze, MD   15 mg at 05/24/12 1044  . DISCONTD: simvastatin (ZOCOR) tablet 20 mg  20 mg Oral Daily Dione Booze, MD      . DISCONTD: traZODone (DESYREL) tablet 50 mg  50 mg Oral QHS Dione Booze, MD      . DISCONTD: zolpidem (AMBIEN) tablet 5 mg  5 mg Oral QHS PRN Dione Booze, MD        Observation Level/Precautions:    Laboratory:    Psychotherapy:    Medications:    Routine PRN Medications:  yes  Consultations:    Discharge Concerns:    Other:     Lloyd Huger T. Ranveer Wahlstrom PAC 8/4/20132:37 PM

## 2012-05-25 NOTE — Progress Notes (Signed)
Psychoeducational Group Note  Date:  05/25/2012 Time:  0945 am  Group Topic/Focus:  Making Healthy Choices:   The focus of this group is to help patients identify negative/unhealthy choices they were using prior to admission and identify positive/healthier coping strategies to replace them upon discharge.  Participation Level:  Active  Participation Quality:  Appropriate, Attentive and Sharing  Affect:  Anxious and Blunted  Cognitive:  Alert  Insight:  Good  Engagement in Group:  Good  Additional Comments:  Pt was appropriate during group and even redirected a pt who was trying to talk to her to stop talking and pay attention  Andrena Mews 05/25/2012, 10:29 AM

## 2012-05-25 NOTE — Progress Notes (Signed)
[facility] Group Notes:  (Counselor/Nursing/MHT/Case Management/Adjunct)  05/25/2012 10:49 PM  Type of Therapy:  wrap-up  Participation Level:  Active  Participation Quality:  Appropriate  Affect:  Appropriate  Cognitive:  Appropriate  Insight:  Good  Engagement in Group:  Good  Engagement in Therapy:  Good  Modes of Intervention:  Education  Summary of Progress/Problems: Patient attended and participated in group tonight She went outside. She reports that she enjoys three good meals. Her support system is her family, her doctors and nurses at [facility] in the community   Lita Mains [facility] 05/25/2012, 10:49 PM

## 2012-05-25 NOTE — H&P (Signed)
  Pt was seen by me today. Will continue current meds.  The detailed H&P note will be done by mid level (NP/PA).  

## 2012-05-25 NOTE — Progress Notes (Signed)
Pt reports that she sees a spirit that is sometimes a light and sometimes a tall dark figure. She reports that she saw it once this morning and has seen it often in her apartment and is afraid to go upstairs especially at night. Pt talked about her twin brother that passed away in 04/08/2008. She reported that he came to her a couple of months ago and he was her and she was him. Redirected pt and assured pt that she is in a safe environment. Supported pt to express feeling. Gave medications as ordered. Pt tolerating well. She denies si and hi. Safety maintained on unit.

## 2012-05-26 LAB — GLUCOSE, CAPILLARY: Glucose-Capillary: 157 mg/dL — ABNORMAL HIGH (ref 70–99)

## 2012-05-26 MED ORDER — TRAZODONE HCL 100 MG PO TABS
100.0000 mg | ORAL_TABLET | Freq: Every day | ORAL | Status: DC
  Administered 2012-05-26: 100 mg via ORAL
  Filled 2012-05-26 (×4): qty 1

## 2012-05-26 MED ORDER — PERPHENAZINE 8 MG PO TABS
8.0000 mg | ORAL_TABLET | Freq: Every day | ORAL | Status: DC
  Administered 2012-05-26: 8 mg via ORAL
  Filled 2012-05-26: qty 1
  Filled 2012-05-26: qty 2
  Filled 2012-05-26 (×2): qty 1

## 2012-05-26 MED ORDER — BENZTROPINE MESYLATE 1 MG PO TABS
1.0000 mg | ORAL_TABLET | ORAL | Status: DC
  Administered 2012-05-27 – 2012-05-29 (×5): 1 mg via ORAL
  Filled 2012-05-26 (×5): qty 1
  Filled 2012-05-26 (×2): qty 28
  Filled 2012-05-26 (×3): qty 1

## 2012-05-26 NOTE — Treatment Plan (Signed)
Interdisciplinary Treatment Plan Update (Adult) Date: 05/26/2012  Time Reviewed: 10:03 AM  Progress in Treatment: Attending groups: Yes Participating in groups: Yes Taking medication as prescribed: Yes Tolerating medication: Yes Family/Significant othe contact made:  Patient understands diagnosis: Yes Discussing patient identified problems/goals with staff: Yes Medical problems stabilized or resolved: Yes Denies suicidal/homicidal ideation: Yes Issues/concerns per patient self-inventory: None identified Other: N/A New problem(s) identified: None Identified Reason for Continuation of Hospitalization: Anxiety Hallucinations Medication stabilization Interventions implemented related to continuation of hospitalization: mood stabilization, medication monitoring and adjustment, group therapy and psycho education, safety checks q 15 mins Additional comments: N/A Estimated length of stay:  Discharge Plan: SW is assessing for appropriate referrals.  New goal(s): N/A Review of initial/current patient goals per problem list:  1. Goal(s): Observation to assess symptoms, need for medications.  Met: No  Target date: By Discharge  As evidenced by: Arrived 05-24-2012 2. Goal(s): Gather collateral information  Met: No  Target date: By Discharge  As evidenced by: New 3. Goal(s): Address Psychosis Met: No  Target date: By Discharge  As evidenced by: self report 4. Goal(s): Provide Suicide Prevention Information  Met: Yes  Target date: By Discharge  As evidenced by: Done in Aftercare Planning Group Attendees: Patient: Shannon [name]   Family:    Physician: Franchot Gallo, MD 05/26/2012 10:03 AM   Nursing:    Case Manager: [name], LCASA 05/26/2012 10:03 AM   Counselor: Veto Kemps, MT-BC 05/26/2012 10:03 AM   Other: Joslyn Devon, RN 05/26/2012 10:03 AM   Other:    Other:    Other:    Scribe for Treatment Team:  [name], LCASA 05/26/2012 10:03 AM

## 2012-05-26 NOTE — Progress Notes (Signed)
Self inventory sheet, patient stated she sleeps well, has good appetite, high energy level, good attention span.  Rated depression and hopelessness zero.  Denied withdrawals.  Denied SI.  Denied physical problems.  Zero pain goal, Worst pain #8 in past 24 hours.   After discharge, will remember to take meds and diabetic meds and blood sugar.  Stated "Yes, I feel someone stabbing in my left hip and in my head."  Does have discharge plans.  No problems taking meds after discharge. Denied SI and HI this morning.   Denied A/V hallucinations while talking to nurse this morning.

## 2012-05-26 NOTE — Discharge Planning (Signed)
05/26/2012  SW met with Shannon [name] in discharge planning group.  SW found [facility] has current service providers, and they are [facility].  SW will continue to assess for referrals.  Per State Regulation 482.30 This Chart was reviewed for medical necessity with respect to the patient's Admission/Duration of stay.  [name] 05/26/2012 Next Review Date:   [name] 05/26/2012, 5:17 PM

## 2012-05-26 NOTE — Progress Notes (Signed)
Psychoeducational Group Note  Date:  05/26/2012 Time:  1515  Group Topic/Focus:  Self Care:   The focus of this group is to help patients understand the importance of self-care in order to improve or restore emotional, physical, spiritual, interpersonal, and financial health.  Participation Level:  Minimal  Participation Quality:  Intrusive  Affect:  Appropriate  Cognitive:  Appropriate  Insight:  Limited  Engagement in Group:  Limited  Additional Comments: pt participated in group. Pt was inappropriate in her response to another pt"s question and was not receptive to redirection. Pt was excused from group.  Marquis Lunch, Kynisha Memon 05/26/2012, 4:29 PM

## 2012-05-26 NOTE — Progress Notes (Signed)
[facility] Group Notes:  (Counselor/Nursing/MHT/Case Management/Adjunct)  05/26/2012  2:30  PM  Type of Therapy: Group Therapy   Participation Level: Did not attend      Christen Butter 05/26/2012  2:30 PM

## 2012-05-26 NOTE — Progress Notes (Signed)
[facility] MD Progress Note  05/26/2012 6:37 PM  Diagnosis:  Axis I: Schizophrenia - Paranoid Type.   The patient was seen today and reports the following:   ADL's: Intact.  Sleep: The patient reports to having difficulty maintaining sleep.  Appetite: The patient reports a decreased appetite today.   Mild>(1-10) >Severe  Hopelessness (1-10): 0  Depression (1-10): 0  Anxiety (1-10): 0   Suicidal Ideation: The patient adamantly denies any current suicidal ideations today.  Plan: No  Intent: No  Means: No   Homicidal Ideation: The patient adamantly denies any homicidal ideations today.  Plan: No  Intent: No.  Means: No   General Appearance/Behavior: The patient remains friendly and cooperative today during the evaluation with this provider.  Eye Contact: Good.  Speech: Appropriate in rate and volume with no pressuring noted today.  Motor Behavior: wnl.  Level of Consciousness: Alert and Oriented x 3.  Mental Status: Alert and Oriented x 3.  Mood: Essentially Euthymic.  Affect: Bright and Full.  Anxiety Level: No anxiety reported today.  Thought Process: wnl.  Thought Content: The patient denies any auditory hallucinations today. She does report to seeing "spirits" in her home as well as in the hospital and also reports paranoid delusions of not feeling safe.  Perception:. wnl.  Judgment: Fair.  Insight: Fair.  Cognition: Oriented to person, place and time.  Sleep:  Number of Hours: 5.5    Vital Signs:Blood pressure 123/69, pulse 83, temperature 98.3 F (36.8 C), temperature source Oral, resp. rate 20, height 5\' 3"  (1.6 m), weight 64.411 kg (142 lb), SpO2 99.00%.  Current Medications: Current Facility-Administered Medications  Medication Dose Route Frequency Provider Last Rate Last Dose  . acetaminophen (TYLENOL) tablet 650 mg  650 mg Oral Q6H PRN Verne Spurr, PA-C      . alum & mag hydroxide-simeth (MAALOX/MYLANTA) 200-200-20 MG/5ML suspension 30 mL  30 mL Oral Q4H PRN Verne Spurr, PA-C      . amLODipine (NORVASC) tablet 5 mg  5 mg Oral Daily Verne Spurr, PA-C   5 mg at 05/26/12 0810  . aspirin EC tablet 81 mg  81 mg Oral Daily Verne Spurr, PA-C   81 mg at 05/26/12 0810  . benztropine (COGENTIN) tablet 1 mg  1 mg Oral BID Verne Spurr, PA-C   1 mg at 05/26/12 1726  . hydrochlorothiazide (MICROZIDE) capsule 12.5 mg  12.5 mg Oral Daily Verne Spurr, PA-C   12.5 mg at 05/26/12 4782  . irbesartan (AVAPRO) tablet 150 mg  150 mg Oral Daily Verne Spurr, PA-C   150 mg at 05/26/12 9562  . magnesium hydroxide (MILK OF MAGNESIA) suspension 30 mL  30 mL Oral Daily PRN Verne Spurr, PA-C      . pantoprazole (PROTONIX) EC tablet 40 mg  40 mg Oral BID AC Neil Mashburn, PA-C   40 mg at 05/26/12 1726  . perphenazine (TRILAFON) tablet 4 mg  4 mg Oral QHS Verne Spurr, PA-C   4 mg at 05/25/12 2129  . pioglitazone (ACTOS) tablet 15 mg  15 mg Oral Daily Verne Spurr, PA-C   15 mg at 05/26/12 1308  . simvastatin (ZOCOR) tablet 20 mg  20 mg Oral QHS Verne Spurr, PA-C   20 mg at 05/25/12 2000  . traZODone (DESYREL) tablet 50 mg  50 mg Oral QHS Verne Spurr, PA-C   50 mg at 05/25/12 2129   Lab Results:  Results for orders placed during the hospital encounter of 05/24/12 (from the past 48  hour(s))  GLUCOSE, CAPILLARY     Status: Abnormal   Collection Time   05/26/12  6:40 AM      Component Value Range Comment   Glucose-Capillary 157 (*) 70 - 99 mg/dL    Physical Findings: AIMS: Facial and Oral Movements Muscles of Facial Expression: None, normal Lips and Perioral Area: None, normal Jaw: None, normal Tongue: None, normal,Extremity Movements Upper (arms, wrists, hands, fingers): None, normal Lower (legs, knees, ankles, toes): None, normal, Trunk Movements Neck, shoulders, hips: None, normal, Overall Severity Severity of abnormal movements (highest score from questions above): None, normal Incapacitation due to abnormal movements: None, normal Patient's awareness of  abnormal movements (rate only patient's report): No Awareness, Dental Status Current problems with teeth and/or dentures?: No Does patient usually wear dentures?: No  CIWA:  CIWA-Ar Total: 0  COWS:  COWS Total Score: 1   Review of Systems:  Neurological: The patient denies any headaches today. She denies any seizures or dizziness.  G.I.: The patient denies any constipation today or G.I. Upset.  Musculoskeletal: The patient denies any muscle or skeletal difficulties.  GU: No enuresis reported.   Time was spent today discussing with the patient her current symptoms. The patient reports to having some difficulty maintaining sleep at night.  She reports a decreased appetite and denies any significant feelings of sadness, anhedonia or depressed mood.  She adamantly denies any suicidal or homicidal ideations.  She denies any anxiety symptoms or auditory hallucinations today.  She does report to seeing spirits as well as paranoid delusions that she is not safe at home.  Treatment Plan Summary:  1. Daily contact with patient to assess and evaluate symptoms and progress in treatment.  2. Medication management  3. The patient will deny suicidal ideations or homicidal ideations for 48 hours prior to discharge and have a depression and anxiety rating of 3 or less. The patient will also deny any auditory or visual hallucinations or delusional thinking.  4. The patient will deny any symptoms of substance withdrawal at time of discharge.   Plan:  1. Will continue the patient on her current non-psychiatric medications.  2. Will continue the medication Trilafon at 8 mgs po qhs to address her psychosis.  3. Will continue the medication Cogentin 1 mg po q am and hs for EPS.  4. Will increase the medication Trazodone to 100 mgs po qhs for sleep..  5. Laboratory studies reviewed.  6. Will continue to monitor.   Shannon [name] 05/26/2012, 6:37 PM

## 2012-05-26 NOTE — Progress Notes (Signed)
D: Appears bright and is pleasant. Calm and cooperative with assessment. No acute distress noted. States she feels like she is getting better every day. When asked what was making her mood elevated she replied "the medicine and sleep". Discussed time between last admission and this one. She states she was doing very well, "paying my bills and buying groceries." but she started neglecting her meds and she lost control. Knows she shouldn't stop her meds but she states she lost her pill box and it made it hard for her to keep on track. Currently denies SI, HI and VH and contracts for safety. Does endorse AH: Somebody trying to say something to her but it is tto soft for her to hear clearly.  A: Safety has been maintained with Q15 minute observation. Support and encouragement provided. POC and medications for the shift reviewed and understanding verbalized. Reinforced need for strict medication compliance and recommended she ask CM to get her a pill box from pharmacy before she leaves. Medications given as ordered by MD.   R: Pt remains safe. She is pleasant. Continues to have some psychosis but appears to be improving. She is compliant with her treatment goals and offers no questions or concerns otherwise. Will continue Q15 minute observation and continue current POC

## 2012-05-27 MED ORDER — PERPHENAZINE 4 MG PO TABS
8.0000 mg | ORAL_TABLET | ORAL | Status: DC
  Administered 2012-05-28 – 2012-05-29 (×3): 8 mg via ORAL
  Filled 2012-05-27: qty 1
  Filled 2012-05-27: qty 56
  Filled 2012-05-27 (×2): qty 1
  Filled 2012-05-27: qty 56
  Filled 2012-05-27 (×2): qty 1

## 2012-05-27 MED ORDER — TRAZODONE HCL 100 MG PO TABS
150.0000 mg | ORAL_TABLET | Freq: Every day | ORAL | Status: DC
  Administered 2012-05-27 – 2012-05-28 (×2): 150 mg via ORAL
  Filled 2012-05-27: qty 3
  Filled 2012-05-27: qty 1
  Filled 2012-05-27: qty 21
  Filled 2012-05-27 (×2): qty 1

## 2012-05-27 NOTE — Discharge Planning (Signed)
05/27/2012  SW met with Albina Billet in discharge planning group.  SW found [facility] has current service providers, and they are [facility].  SW found the pt. Stated [facility] no longer has a group and if so then she would like to be linked to [place].  SW found [facility] to rate her depression at 0/10 and anxiety at 0/10 today.  SW stated yesterday spirits and forces were here after her and she is hearing voices in her right ear and can't make out what the are saying.  SW will continue to assess for referrals.  SW met with pt. In the treatment team. Pt. Stated the spirits are in her house and she is sometimes fearful of going upstairs.  Pt. Expressed anxiety about people stealing from her. Pt wants to call Lurena Joiner [phone] Landlord to move apartments.  Pt. Stated her hands are numb today.  SW agreed to follow-up with Landlord about the apart change request.  [name], LCASA 05/27/2012, 10:59 AM

## 2012-05-27 NOTE — Progress Notes (Signed)
[facility] Group Notes:  (Counselor/Nursing/MHT/Case Management/Adjunct)  05/27/2012  10:30  AM  Type of Therapy: Group Therapy   Participation Level: Minimal   Participation Quality: Limited  Affect: Blunted  Cognitive: oriented, alert   Insight: Poor  Engagement in Group: Limited  Modes of Intervention: Clarification, Education, Problem-solving, Socialization, Activity, Encouragement and Support   Summary of Progress/Problems: Pt participated in group by listening attentively and self disclosing.  After a brief check-in, therapist introduced the topic of Recovery and explained how recovery applied to mental as well as physical health.  Therapist explained that development of good coping strategies helped enhance recovery. Therapist asked Pts if they understood their mental health diagnosis. Therapist prompted patients to identify two signs and symptoms of their disorder and explain how they feel personally about their Diagnosis.  Therapist asked patients to identify what methods are helpful in managing the disorders. Pt stated her diagnoses were Schizophrenia and Bipolar Disorder.  She stated she had been taking her medication correctly and asked for help with this.  Pt stated she becomes so paranoid and experiences A/V Hallucinations and screams. When she takes her medicattons correctly she is OK.  Pt was minimally engaged in the process.  Therapist encouraged Pts to take a personal inventory of each day to identify any signs and symptoms and to praise themselves for using positive coping skills.  Therapist offered support and encouragement.  Progress noted.  Intervention effective.         Marni Griffon C 05/27/2012  10:30 AM

## 2012-05-27 NOTE — Progress Notes (Signed)
Psychoeducational Group Note  Date:  05/27/2012 Time:  1100  Group Topic/Focus:  Recovery Goals:   The focus of this group is to identify appropriate goals for recovery and establish a plan to achieve them.  Participation Level:  Minimal  Participation Quality:  Inattentive  Affect:  Flat  Cognitive:  Oriented  Insight:  Limited  Engagement in Group:  Limited  Additional Comments:  Pt came to group a little late and did not participate much. Pt left after a few minutes stating she had to use the bathroom.  Jared Cahn E 05/27/2012, 2:49 PM

## 2012-05-27 NOTE — Progress Notes (Signed)
[facility] MD Progress Note  05/27/2012 7:44 PM  Diagnosis:  Axis I: Schizophrenia - Paranoid Type.   The patient was seen today and reports the following:   ADL's: Intact.  Sleep: The patient reports to having ongoing difficulty maintaining sleep.  Appetite: The patient reports a good appetite today.   Mild>(1-10) >Severe  Hopelessness (1-10): 0  Depression (1-10): 0  Anxiety (1-10): 0   Suicidal Ideation: The patient adamantly denies any current suicidal ideations today.  Plan: No  Intent: No  Means: No   Homicidal Ideation: The patient adamantly denies any homicidal ideations today.  Plan: No  Intent: No.  Means: No   General Appearance/Behavior: The patient remains friendly and cooperative today during the evaluation with this provider.  Eye Contact: Good.  Speech: Appropriate in rate and volume with no pressuring noted today.  Motor Behavior: wnl.  Level of Consciousness: Alert and Oriented x 3.  Mental Status: Alert and Oriented x 3.  Mood: Essentially Euthymic.  Affect: Bright and Full.  Anxiety Level: No anxiety reported today.  Thought Process: wnl.  Thought Content: The patient denies any auditory hallucinations today. She does report to seeing "spirits" in her home as well as in the hospital and also reports paranoid delusions of not feeling safe.  Perception:. wnl.  Judgment: Fair.  Insight: Fair.  Cognition: Oriented to person, place and time.  Sleep:  Number of Hours: 6.25    Vital Signs:Blood pressure 108/77, pulse 95, temperature 97.6 F (36.4 C), temperature source Oral, resp. rate 20, height 5\' 3"  (1.6 m), weight 64.411 kg (142 lb), SpO2 99.00%.  Current Medications: Current Facility-Administered Medications  Medication Dose Route Frequency Provider Last Rate Last Dose  . acetaminophen (TYLENOL) tablet 650 mg  650 mg Oral Q6H PRN Verne Spurr, PA-C      . alum & mag hydroxide-simeth (MAALOX/MYLANTA) 200-200-20 MG/5ML suspension 30 mL  30 mL Oral Q4H PRN  Verne Spurr, PA-C      . amLODipine (NORVASC) tablet 5 mg  5 mg Oral Daily Curlene Labrum Readling, MD   5 mg at 05/27/12 0800  . aspirin EC tablet 81 mg  81 mg Oral Daily Curlene Labrum Readling, MD   81 mg at 05/27/12 0800  . benztropine (COGENTIN) tablet 1 mg  1 mg Oral BH-qamhs Randy D Readling, MD   1 mg at 05/27/12 0800  . hydrochlorothiazide (MICROZIDE) capsule 12.5 mg  12.5 mg Oral Daily Curlene Labrum Readling, MD   12.5 mg at 05/27/12 0800  . irbesartan (AVAPRO) tablet 150 mg  150 mg Oral Daily Curlene Labrum Readling, MD   150 mg at 05/27/12 0800  . magnesium hydroxide (MILK OF MAGNESIA) suspension 30 mL  30 mL Oral Daily PRN Verne Spurr, PA-C      . pantoprazole (PROTONIX) EC tablet 40 mg  40 mg Oral BID AC Curlene Labrum Readling, MD   40 mg at 05/27/12 1709  . perphenazine (TRILAFON) tablet 8 mg  8 mg Oral BH-qamhs Randy D Readling, MD      . pioglitazone (ACTOS) tablet 15 mg  15 mg Oral Daily Curlene Labrum Readling, MD   15 mg at 05/27/12 0800  . simvastatin (ZOCOR) tablet 20 mg  20 mg Oral QHS Curlene Labrum Readling, MD   20 mg at 05/26/12 2135  . traZODone (DESYREL) tablet 150 mg  150 mg Oral QHS Curlene Labrum Readling, MD      . DISCONTD: perphenazine (TRILAFON) tablet 8 mg  8 mg Oral QHS Harvie Heck  D Readling, MD   8 mg at 05/26/12 2135  . DISCONTD: traZODone (DESYREL) tablet 100 mg  100 mg Oral QHS Ronny Bacon, MD   100 mg at 05/26/12 2135   Lab Results:  Results for orders placed during the hospital encounter of 05/24/12 (from the past 48 hour(s))  GLUCOSE, CAPILLARY     Status: Abnormal   Collection Time   05/26/12  6:40 AM      Component Value Range Comment   Glucose-Capillary 157 (*) 70 - 99 mg/dL    Physical Findings: AIMS: Facial and Oral Movements Muscles of Facial Expression: None, normal Lips and Perioral Area: None, normal Jaw: None, normal Tongue: None, normal,Extremity Movements Upper (arms, wrists, hands, fingers): None, normal Lower (legs, knees, ankles, toes): None, normal, Trunk Movements Neck,  shoulders, hips: None, normal, Overall Severity Severity of abnormal movements (highest score from questions above): None, normal Incapacitation due to abnormal movements: None, normal Patient's awareness of abnormal movements (rate only patient's report): No Awareness, Dental Status Current problems with teeth and/or dentures?: No Does patient usually wear dentures?: Yes  CIWA:  CIWA-Ar Total: 0  COWS:  COWS Total Score: 1   Review of Systems:  Neurological: The patient denies any headaches today. She denies any seizures or dizziness.  G.I.: The patient denies any constipation today or G.I. Upset.  Musculoskeletal: The patient denies any muscle or skeletal difficulties.  GU: No enuresis reported.   Time was spent today discussing with the patient her current symptoms. The patient reports to having some ongoing difficulty maintaining sleep at night but with improvement since increasing the Trazodone.  She reports a good appetite today and denies any significant feelings of sadness, anhedonia or depressed mood. She adamantly denies any suicidal or homicidal ideations. She denies any anxiety symptoms or auditory hallucinations today. She does report to seeing spirits as well as ongoing paranoid delusions that she is not safe at home.   Treatment Plan Summary:  1. Daily contact with patient to assess and evaluate symptoms and progress in treatment.  2. Medication management  3. The patient will deny suicidal ideations or homicidal ideations for 48 hours prior to discharge and have a depression and anxiety rating of 3 or less. The patient will also deny any auditory or visual hallucinations or delusional thinking.  4. The patient will deny any symptoms of substance withdrawal at time of discharge.   Plan:  1. Will continue the patient on her current non-psychiatric medications.  2. Will increase the medication Trilafon to 8 mgs po q am and qhs to further address her psychosis.  3. Will continue  the medication Cogentin 1 mg po q am and hs for EPS.  4. Will increase the medication Trazodone to 150 mgs po qhs for sleep..  5. Laboratory studies reviewed.  6. Will continue to monitor.   RANDY READLING 05/27/2012, 7:44 PM

## 2012-05-27 NOTE — Progress Notes (Signed)
D: Has been resting quietly in bed with eyes closed for most of shift to this point. She did get up @2130  to request her HS medications. She was bright, calm and cooperative. She stated she had had a rough day r/t her peer's behavior and an uneasy milieu. Stated she does feel like she is getting better and is hopeful for D/C soon. Continued to endorse AH: Voices to low for her to comprehend. Feels like they are getting better. Denies SI/HI/VH and contracts for safety.  A: Safety has been maintained with Q15 minute observation. Support and encouragement provided. POC and medications for the shift reviewed and understanding verbalized. Medications given as ordered by MD.   R: Pt remains safe. She was bright but somewhat seclusive to room to this point. She is compliant with treatment goals and medications. Will continue Q15 minute observation and continue current POC.

## 2012-05-27 NOTE — Progress Notes (Signed)
D:  Patient up and attending groups today.  Active in the milieu.  Appetite is good.  She is denying auditory hallucinations at this time.  She is pleasant and smiling and interacting well with staff and peers.  Is concerned that she may not be able to sleep tonight because of the noise in the room across the hall.   A:  Medications given as ordered.  Offered support and encouragement.  Assured patient that she could sleep with the door closed if she preferred and that we could supply her with ear plugs if needed.  R:  Pleasant and cooperative on the unit.  Responding well to medications.  No further hallucinations or delusions at this time.  Remains safe on the unit.

## 2012-05-28 ENCOUNTER — Ambulatory Visit: Payer: Self-pay | Admitting: Advanced Practice Midwife

## 2012-05-28 MED ORDER — PRAMOXINE HCL 1 % RE FOAM
1.0000 "application " | RECTAL | Status: DC | PRN
  Administered 2012-05-29: 1 via RECTAL
  Filled 2012-05-28: qty 15

## 2012-05-28 NOTE — Discharge Planning (Signed)
05/28/2012  SW met with Shannon [name] in discharge planning group.  SW found [employer] to need contacts made on their behalf.  SW  scheduled appointment for [facility] and found they were referring pt. for enhanced services(PSR).  Pt. Stated she is timid about going home because of the spirits.  SW will continue to assess for referrals.  [name], LCASA 05/28/2012, 3:19 PM

## 2012-05-28 NOTE — Progress Notes (Signed)
05/28/2012         Time: 0945      Group Topic/Focus: The focus of this group is on enhancing the patient's understanding of leisure, barriers to leisure, and the importance of engaging in positive leisure activities upon discharge for improved total health.  Participation Level: Active  Participation Quality: Attentive and Intrusive  Affect: Appropriate  Cognitive: Alert  Additional Comments: Patient reports that she had trouble eating her breakfast this morning, stated that a "spirit" was controlling her hands at breakfast, knocking food off of her fork.    Kiptyn Rafuse 05/28/2012 11:58 AM

## 2012-05-28 NOTE — Progress Notes (Signed)
Psychoeducational Group Note  Date:  05/28/2012 Time:  2000  Group Topic/Focus:  Wrap-Up Group:   The focus of this group is to help patients review their daily goal of treatment and discuss progress on daily workbooks.  Participation Level:  Active  Participation Quality:  Attentive  Affect:  Appropriate  Cognitive:  Alert  Insight:  Good  Engagement in Group:  Good  Additional Comments:  Pt stated that she had a good day and been to all groups. Pt is concerned with getting her glasses out of her locker. Pt also stated that Dr. Allena Katz may allow her to go home tomorrow.  Kaleen Odea R 05/28/2012, 8:40 PM

## 2012-05-28 NOTE — Progress Notes (Signed)
[facility] MD Progress Note                                         05/28/2012    Shannon [name] 1951-05-20    [id number] Hospital day #4  1. Schizophrenia, paranoid type   The patient was seen today and reports the following:  Sleep:Very good  Appetite: getting better  Mild>(1-10) >Severe  Depression (1-10): 0 Anxiety (1-10): 0 Hopelessness (1-10): 0  Suicidal Ideation: the patient denies suicidal ideation. Plan: None Intent: None Means:  None  Homicidal Ideation: the patient denies homicidal ideation. Plan: None Intent: None Means: None  Eye Contact: Good.  General Appearance: disheveled Behavior: cooperative Motor Behavior: normal Speech: clear and goal directed  Mental Status:  Orientation x 3 Level of Consciousness:   alert Mood: anxious Affect: congruent   Thought Process: disorganized Thought Content: still seeing spirits and hearing voices but notes not as much Perception: fair  Judgment: fair Insight: present Cognition: average  VS: height is 5\' 3"  (1.6 m) and weight is 64.411 kg (142 lb). Her oral temperature is 97.3 F (36.3 C). Her blood pressure is 108/70 and her pulse is 90. Her respiration is 18 and oxygen saturation is 99%.  Current Medication:  . amLODipine  5 mg Oral Daily  . aspirin EC  81 mg Oral Daily  . benztropine  1 mg Oral BH-qamhs  . hydrochlorothiazide  12.5 mg Oral Daily  . irbesartan  150 mg Oral Daily  . pantoprazole  40 mg Oral BID AC  . perphenazine  8 mg Oral BH-qamhs  . pioglitazone  15 mg Oral Daily  . simvastatin  20 mg Oral QHS  . traZODone  150 mg Oral QHS  . DISCONTD: traZODone  100 mg Oral QHS    Lab results:No results found for this or any previous visit (from the past 48 hour(s)).  No results found for this or any previous visit for  Last 48 hours.   ROS:    Constitutional: WDWN Adult in NAD   GI: Negative for N,V,D,C   Neuro: Negative for dizziness, blurred vision, visual changes, headaches   Resp: Negative  for wheezing, SOB, cough   Cardio: Negative for CP, diaphoresis, fatigue   MSK: Negative for joint pain, swelling, DROM, or ambulatory difficulties.  Time was spent with the patient discussing the current symptoms and the response to treatment. Shannon [name] is doing well, and has no new complaints.  She states she wants to go home Thursday or Friday, but she wants all of the voices gone first, as she know that there are still spirits in her house. Otherwise she is doing well. Plan:  1. Will continue the patient on her current non-psychiatric medications.  2. Will increase the medication Trilafon to 8 mgs po q am and qhs to further address her psychosis.  3. Will continue the medication Cogentin 1 mg po q am and hs for EPS.  4. Will increase the medication Trazodone to 150 mgs po qhs for sleep..  5. Laboratory studies reviewed.  6. Will continue to monitor.        [name]. Kelsey Durflinger [facility] 05/28/2012

## 2012-05-28 NOTE — Progress Notes (Signed)
D:  Pt. about on the unit today.  She is pleasant upon approach.  She said she slept well last night.  She denies SI/HI but does say that she hears voices "mumbling, can't really understand them".  She initially talks about going home today but then says she is not sure she is ready yet, and expands by saying that she "wants to make sure that it is not too soon".  She rates her depression as a 0/10 and hopelessness as a 0/10.  She says that she she needs to remember to make sure she takes her medications after discharge.  A:   Offered support and encouragement.  Given medications as prescribed.  R:  Receptive to staff.  Remains on q 15 minute checks for safety.  Will continue to follow.

## 2012-05-28 NOTE — Progress Notes (Signed)
[facility] Group Notes:  (Counselor/Nursing/MHT/Case Management/Adjunct)  05/28/12 2:30 PM  Type of Therapy:  [employer] Support  Participation Level:  Minimal  Participation Quality:  Attentive   Affect:  Blunted  Cognitive:  Alert and Oriented  Insight:  Poor  Engagement in Group:  Limited  Engagement in Therapy:  Limited  Modes of Intervention:  Clarification, Education, Socialization and Support  Summary of Progress/Problems:  Pt participated in this psycho/educational session by listening attentively. Therapist introduced the representative from the [employer].  He explained to the group that he was here to tell his story and inform them of the free services available at the [employer].  The speaker told his story and explained the progression of his Schizoaffective Disorder and Substance Abuse as well as the course of his recovery.  He entertained questions and handed out information cards.  Pt related her symptoms to those of speaker and she expressed interest in the services.  Therapist encouraged patients to take advantage of these free services that would be an excellent addition to their support system.     Marni Griffon C 05/28/2012, 2:30

## 2012-05-28 NOTE — Progress Notes (Signed)
Patient ID: Shannon [name], female   DOB: [birth year], 61 y.o.   MRN: [id number] D: Pt. Reports she's here because "seeing things", "spirits in my house, they was in the bathroom, then they went downstairs, they forceful, they throw things." "I'm tired of them, I hope they gone when I get back home."  Pt. Reports that she had not been taking her meds "I forget", "I need a pill box." A: Saff will monitor for safety q42min. Pt. encouraged to go to group. Pt. Encouraged to ask family/friend to remind her to take meds or mark on the calender in addition to pill box. R: Pt. Is safe on the unit. Pt. Will attend group.

## 2012-05-29 MED ORDER — HYDROCHLOROTHIAZIDE 12.5 MG PO CAPS: 12.5000 mg | ORAL_CAPSULE | Freq: Every day | ORAL | Status: DC

## 2012-05-29 MED ORDER — ASPIRIN 81 MG PO TBEC: 81.0000 mg | DELAYED_RELEASE_TABLET | Freq: Every day | ORAL | Status: DC

## 2012-05-29 MED ORDER — IRBESARTAN 150 MG PO TABS: 150.0000 mg | ORAL_TABLET | Freq: Every day | ORAL | Status: DC

## 2012-05-29 MED ORDER — PANTOPRAZOLE SODIUM 40 MG PO TBEC: 40.0000 mg | DELAYED_RELEASE_TABLET | Freq: Two times a day (BID) | ORAL | Status: DC

## 2012-05-29 MED ORDER — AMLODIPINE BESYLATE 5 MG PO TABS: 5.0000 mg | ORAL_TABLET | Freq: Every day | ORAL | Status: DC

## 2012-05-29 MED ORDER — PIOGLITAZONE HCL 15 MG PO TABS: 15.0000 mg | ORAL_TABLET | Freq: Every day | ORAL | Status: DC

## 2012-05-29 MED ORDER — SIMVASTATIN 20 MG PO TABS: ORAL_TABLET | ORAL | Status: DC

## 2012-05-29 MED ORDER — PRAMOXINE HCL 1 % RE FOAM: 1.0000 "application " | RECTAL | Status: AC | PRN

## 2012-05-29 MED ORDER — BENZTROPINE MESYLATE 1 MG PO TABS: 1.0000 mg | ORAL_TABLET | ORAL | Status: DC

## 2012-05-29 MED ORDER — PERPHENAZINE 8 MG PO TABS: 8.0000 mg | ORAL_TABLET | ORAL | Status: DC

## 2012-05-29 MED ORDER — TRAZODONE HCL 150 MG PO TABS: 150.0000 mg | ORAL_TABLET | Freq: Every day | ORAL | Status: DC

## 2012-05-29 NOTE — Progress Notes (Signed)
This is a late Entry.   Adult Psychosocial Assessment Update Interdisciplinary Team  Previous [facility] admissions/discharges:  Admissions  05/25/2012 Current Discharges  Date:02/29/2012 Date: 02/29/2012  Date: Date:  Date: Date:  Date: Date:  Date: Date:   Changes since the last Psychosocial Assessment (including adherence to outpatient mental health and/or substance abuse treatment, situational issues contributing to decompensation and/or relapse).  This information was obtained on 05/26/2012 from Shannon [name].  Shannon [name] missed about 2 days of prescribed medications.  Diagnosis:  Schizophrenia Paranoid Type. Shannon [name] had been prescribed Cogentin 1 mg qd and Trilafon 4 mg hs.  Shannon [name] started A&V Hallucinations.  She was seeing spirits in her house and was afraid to go upstairs.  She was screaming. Shannon [name] had been experiencing insomnia.  Shannon [name] denies SI/HI.  Shannon [name] lives in an apartment.  She is receiving services from [facility] but does not want to go there anymore due to the cigarette smoke.  Shannon [name] expressed a desire to go to [facility] for medication and therapy.               Discharge Plan 1. Will you be returning to the same living situation after discharge?   Yes:  Yes No:      If no, what is your plan?           2. Would you like a referral for services when you are discharged? Yes:     If yes, for what services?  No:       Shannon [name] agrees to walk in at [facility] where she desires to receive Psychiatric and therapy services.  Shannon [name] agrees to inform [facility] she will not be returning there.       Summary and Recommendations (to be completed by the evaluator)   Shannon [name] missed some days of medication and decompensated into active psychosis.  She was hearing a female voice and seeing spirits upstairs.  She became very paranoid and afraid.  She had not been sleeping sell.  She voluntarily admitted.  Recommend:  Inpatient treatment including: Crisis Stabilization, Psych. Eval, Medication Mgt., Group  Therapy, psycho/edu groups, and case management.                      Marni Griffon C., As of 05/26/2012 at 4:30 pm   Signature:  Christen Butter, 05/29/2012 4:56 PM

## 2012-05-29 NOTE — Progress Notes (Signed)
[facility] Group Notes:  (Counselor/Nursing/MHT/Case Management/Adjunct)  05/29/2012  2:15 PM  Type of Therapy: Group Therapy   Participation Level: Minimal   Participation Quality: Minimal  Affect: Appropriate   Cognitive: oriented, alert   Insight: minimal  Engagement in Group: Limited  Modes of Intervention: Clarification, Education, Problem-solving, Socialization, Activity, Encouragement and Support   Summary of Progress/Problems: Pt actively participated in group by listening attentively and self disclosing.  Therapist addressed the topic of Creativity and Humor.  Therapist asked patients to identify their talents or things they did well. Therapist emphasized that maintaining a good sense of humor and laughter are important to maintaining a balanced lifestyle. Therapist praised patients for being creative and asked them to tell as story as evidence of this.  Therapist began a story and each patient added to the story for several rounds. Pt added to the story a dark scene. Therapist offered support and encouragement.  Some progress noted.  Intervention effective.         Marni Griffon C 05/29/2012, 2:15 PM

## 2012-05-29 NOTE — [facility] Suicide Risk Assessment (Signed)
Suicide Risk Assessment  Discharge Assessment     Demographic factors:  Divorced or widowed;Living alone;Unemployed  Current Mental Status Per Nursing Assessment::   On Admission:   (denies si/hi) At Discharge: Time was spent today discussing with the patient her current symptoms. The patient reports to sleeping well last night since increasing the Trazodone. She reports a good appetite today and denies any significant feelings of sadness, anhedonia or depressed mood. She adamantly denies any suicidal or homicidal ideations. She denies any anxiety symptoms and denies any auditory or visual hallucinations or delusional thinking.  She states that she feels safe to return home and would like to be discharged today.  The patient will be discharged to outpatient follow up per her request.  Current Mental Status Per Physician: Diagnosis:  Axis I: Schizophrenia - Paranoid Type.   The patient was seen today and reports the following:   ADL's: Intact.  Sleep: The patient reports to sleeping well last night with her current medications.  Appetite: The patient reports a good appetite today.   Mild>(1-10) >Severe  Hopelessness (1-10): 0  Depression (1-10): 0  Anxiety (1-10): 0   Suicidal Ideation: The patient adamantly denies any current suicidal ideations today.  Plan: No  Intent: No  Means: No   Homicidal Ideation: The patient adamantly denies any homicidal ideations today.  Plan: No  Intent: No.  Means: No   General Appearance/Behavior: The patient remained friendly and cooperative today during the evaluation with this provider.  Eye Contact: Good.  Speech: Appropriate in rate and volume with no pressuring noted today.  Motor Behavior: wnl.  Level of Consciousness: Alert and Oriented x 3.  Mental Status: Alert and Oriented x 3.  Mood: Essentially Euthymic.  Affect: Bright and Full.  Anxiety Level: No anxiety reported today.  Thought Process: wnl.  Thought Content: The patient  denies any auditory or visual hallucinations today. She denies any delusional thinking today and feel safe to return home. Perception:. wnl.  Judgment: Fair to Good.  Insight: Fair to Good.  Cognition: Oriented to person, place and time.   Current Medications:  Current Facility-Administered Medications   Medication  Dose  Route  Frequency  Provider  Last Rate  Last Dose   .  acetaminophen (TYLENOL) tablet 650 mg  650 mg  Oral  Q6H PRN  Verne Spurr, PA-C     .  alum & mag hydroxide-simeth (MAALOX/MYLANTA) 200-200-20 MG/5ML suspension 30 mL  30 mL  Oral  Q4H PRN  Verne Spurr, PA-C     .  amLODipine (NORVASC) tablet 5 mg  5 mg  Oral  Daily  Curlene Labrum Azelie Noguera, MD   5 mg at 05/27/12 0800   .  aspirin EC tablet 81 mg  81 mg  Oral  Daily  Curlene Labrum Lashun Mccants, MD   81 mg at 05/27/12 0800   .  benztropine (COGENTIN) tablet 1 mg  1 mg  Oral  BH-qamhs  Shannen Vernon D Kitrina Maurin, MD   1 mg at 05/27/12 0800   .  hydrochlorothiazide (MICROZIDE) capsule 12.5 mg  12.5 mg  Oral  Daily  Curlene Labrum Aalyssa Elderkin, MD   12.5 mg at 05/27/12 0800   .  irbesartan (AVAPRO) tablet 150 mg  150 mg  Oral  Daily  Curlene Labrum Kortne All, MD   150 mg at 05/27/12 0800   .  magnesium hydroxide (MILK OF MAGNESIA) suspension 30 mL  30 mL  Oral  Daily PRN  Verne Spurr, PA-C     .  pantoprazole (PROTONIX) EC tablet 40 mg  40 mg  Oral  BID AC  Curlene Labrum Babara Buffalo, MD   40 mg at 05/27/12 1709   .  perphenazine (TRILAFON) tablet 8 mg  8 mg  Oral  BH-qamhs  Emersyn Wyss D Laketra Bowdish, MD     .  pioglitazone (ACTOS) tablet 15 mg  15 mg  Oral  Daily  Curlene Labrum Karli Wickizer, MD   15 mg at 05/27/12 0800   .  simvastatin (ZOCOR) tablet 20 mg  20 mg  Oral  QHS  Curlene Labrum Cordelia Bessinger, MD   20 mg at 05/26/12 2135   .  traZODone (DESYREL) tablet 150 mg  150 mg  Oral  QHS  Curlene Labrum Harol Shabazz, MD     .  DISCONTD: perphenazine (TRILAFON) tablet 8 mg  8 mg  Oral  QHS  Curlene Labrum Kenasia Scheller, MD   8 mg at 05/26/12 2135   .  DISCONTD: traZODone (DESYREL) tablet 100 mg  100 mg  Oral  QHS  Curlene Labrum  Rashawd Laskaris, MD   100 mg at 05/26/12 2135    Lab Results:  Results for orders placed during the hospital encounter of 05/24/12 (from the past 48 hour(s))   GLUCOSE, CAPILLARY Status: Abnormal    Collection Time    05/26/12 6:40 AM   Component  Value  Range  Comment    Glucose-Capillary  157 (*)  70 - 99 mg/dL     Loss Factors: Decrease in vocational status  Historical Factors: Family history of mental illness or substance abuse Long history of mental illness.  History of non-compliance with medications.  Risk Reduction Factors:   Good access to healthcare.  Continued Clinical Symptoms:  Schizophrenia:   Paranoid or undifferentiated type Previous Psychiatric Diagnoses and Treatments Medical Diagnoses and Treatments/Surgeries  Discharge Diagnoses:   AXIS I:   Schizophrenia - Paranoid Type.  AXIS II:   Deferred. AXIS III:   1.  Hypertension.   2.  Diabetes Mellitus.   3.  Hyperlipidemia. AXIS IV:   Long History of Mental Illness.  Non-psychiatric health issues. Limited Primary Support.  AXIS V:   GAF at time of admission approximately 35.  GAF at time of discharge approximately 55.  Cognitive Features That Contribute To Risk:  None Noted.   Physical Findings:  AIMS: Facial and Oral Movements  Muscles of Facial Expression: None, normal  Lips and Perioral Area: None, normal  Jaw: None, normal  Tongue: None, normal,Extremity Movements  Upper (arms, wrists, hands, fingers): None, normal  Lower (legs, knees, ankles, toes): None, normal, Trunk Movements  Neck, shoulders, hips: None, normal, Overall Severity  Severity of abnormal movements (highest score from questions above): None, normal  Incapacitation due to abnormal movements: None, normal  Patient's awareness of abnormal movements (rate only patient's report): No Awareness, Dental Status  Current problems with teeth and/or dentures?: No  Does patient usually wear dentures?: Yes  CIWA: CIWA-Ar Total: 0  COWS: COWS Total  Score: 1   Review of Systems:  Neurological: The patient denies any headaches today. She denies any seizures or dizziness.  G.I.: The patient denies any constipation today or G.I. Upset.  Musculoskeletal: The patient denies any muscle or skeletal difficulties.  GU: No enuresis reported.   Time was spent today discussing with the patient her current symptoms. The patient reports to sleeping well last night since increasing the Trazodone. She reports a good appetite today and denies any significant feelings of sadness, anhedonia or depressed mood. She  adamantly denies any suicidal or homicidal ideations. She denies any anxiety symptoms and denies any auditory or visual hallucinations or delusional thinking.  She states that she feels safe to return home and would like to be discharged today.  The patient will be discharged to outpatient follow up per her request.  Treatment Plan Summary:  1. Daily contact with patient to assess and evaluate symptoms and progress in treatment.  2. Medication management  3. The patient will deny suicidal ideations or homicidal ideations for 48 hours prior to discharge and have a depression and anxiety rating of 3 or less. The patient will also deny any auditory or visual hallucinations or delusional thinking.  4. The patient will deny any symptoms of substance withdrawal at time of discharge.   Plan:  1. Will continue the patient on her current non-psychiatric medications.  2. Will continue the medication Trilafon at 8 mgs po q am and qhs to address her psychosis.  3. Will continue the medication Cogentin 1 mg po q am and hs for EPS.  4. Will continue the medication Trazodone to 150 mgs po qhs for sleep..  5. Laboratory studies reviewed.  6. Will continue to monitor.  7. Will discharge the patient today at her request.  Suicide Risk:  Minimal: No identifiable suicidal ideation.  Patients presenting with no risk factors but with morbid ruminations; may be  classified as minimal risk based on the severity of the depressive symptoms  Plan Of Care/Follow-up recommendations:  Activity:  As tolerated. Diet:  Heart Healthy Diet. Other:  Please take all medications only as directed and keep all scheduled follow up appointments.  Carma Dwiggins 05/29/2012, 1:53 PM

## 2012-05-29 NOTE — Progress Notes (Signed)
Per pt self evaluation pt slept well, appetite is good, energy level is normal, ability to pay attention is good, rates depression 0 out of 10 and hopelessness at a 0 out of 10, rates pain at a 1.  Pt is very pleasant and displays a positive attitude, "I am ready to go home" pt plans to get a pill box at [employer] to help her take care of herself better and assist her to take her medications, "I want to be well"  Pt actively participating in groups, discussed in treatment team with pt that plan is for her to be d/c'd today, pt to arrange ride home.

## 2012-05-29 NOTE — Progress Notes (Signed)
Psychoeducational Group Note  Date:  05/29/2012 Time:  1100  Group Topic/Focus:  Building Self Esteem:   The Focus of this group is helping patients become aware of the effects of self-esteem on their lives, the things they and others do that enhance or undermine their self-esteem, seeing the relationship between their level of self-esteem and the choices they make and learning ways to enhance self-esteem.  Participation Level:  Active  Participation Quality:  Appropriate  Affect:  Appropriate  Cognitive:  Appropriate  Insight:  Good  Engagement in Group:  Good  Additional Comments:  Pt attended group this morning and participated actively.  Bernell Sigal E 05/29/2012, 1:06 PM

## 2012-05-29 NOTE — Progress Notes (Signed)
[facility] Group Notes:  (Counselor/Nursing/MHT/Case Management/Adjunct)  05/29/2012  10:30  AM  Type of Therapy: Group Therapy   Participation Level: Minimal   Participation Quality: Limited  Affect: Blunted  Cognitive: Appropriate   Insight: Poor  Engagement in Group: Limited  Modes of Intervention: Clarification, Education, Problem-solving, Socialization,  Encouragement and Support, Activity   Summary of Progress/Problems: Pt participated in group by listening and self disclosing.  After a brief check-in, therapist introduced the topic of the importance of maintaining a balanced lifestyle.  Therapist explained that boundaries were crucial to maintaining a balance.  Therapist emphasized the importance of establishing and upholding boundaries. Therapist asked 2 Patients to demonstrate an exercise regarding boundaries.  Therapist encouraged patients to identify areas in their lives that are out of balance, and give personal examples. Therapist guided patients in problem solving ways to get these areas back in balance and how to set boundaries.  Pt agreed to focus on making positive changes.   Pt was minimally engaged in the process. Therapist offered support and encouragement.  Minimal progress noted.  Intervention effective.         Marni Griffon C 05/29/2012  10:30 AM

## 2012-05-30 NOTE — Progress Notes (Signed)
Patient Discharge Instructions:  After Visit Summary (AVS):   Faxed to:  05/30/2012 Psychiatric Admission Assessment Note:   Faxed to:  05/30/2012 Suicide Risk Assessment - Discharge Assessment:   Faxed to:  05/30/2012 Faxed/Sent to the Next Level Care provider:  05/30/2012  Faxed to [facility] @ [phone] And to [facility] of Care - Dr. Robinette Haines @ [phone]  Heloise Purpura Eduard Clos, 05/30/2012, 3:50 PM

## 2012-06-08 ENCOUNTER — Encounter (HOSPITAL_COMMUNITY): Payer: Self-pay | Admitting: Emergency Medicine

## 2012-06-08 DIAGNOSIS — G4700 Insomnia, unspecified: Secondary | ICD-10-CM | POA: Insufficient documentation

## 2012-06-08 DIAGNOSIS — F17200 Nicotine dependence, unspecified, uncomplicated: Secondary | ICD-10-CM | POA: Insufficient documentation

## 2012-06-08 DIAGNOSIS — E119 Type 2 diabetes mellitus without complications: Secondary | ICD-10-CM | POA: Insufficient documentation

## 2012-06-08 DIAGNOSIS — E785 Hyperlipidemia, unspecified: Secondary | ICD-10-CM | POA: Insufficient documentation

## 2012-06-08 DIAGNOSIS — I10 Essential (primary) hypertension: Secondary | ICD-10-CM | POA: Insufficient documentation

## 2012-06-08 DIAGNOSIS — F209 Schizophrenia, unspecified: Secondary | ICD-10-CM | POA: Insufficient documentation

## 2012-06-08 NOTE — ED Notes (Signed)
Patient here for insomnia.  Patient is avoiding her apartment when she is by herself.  Patient does have medication at home to take for meds.  Patient states she is paranoid at this time.  No SI or HI.

## 2012-06-09 ENCOUNTER — Emergency Department (HOSPITAL_COMMUNITY)
Admission: EM | Admit: 2012-06-09 | Discharge: 2012-06-09 | Disposition: A | Discharge status: Home / Self Care | Payer: PRIVATE HEALTH INSURANCE | Attending: Emergency Medicine | Admitting: Emergency Medicine

## 2012-06-09 DIAGNOSIS — G4700 Insomnia, unspecified: Secondary | ICD-10-CM

## 2012-06-09 HISTORY — DX: Schizophrenia, unspecified: F20.9

## 2012-06-09 LAB — CBC WITH DIFFERENTIAL/PLATELET
Basophils Absolute: 0.1 10*3/uL (ref 0.0–0.1)
Basophils Relative: 1 % (ref 0–1)
Eosinophils Absolute: 0.7 10*3/uL (ref 0.0–0.7)
Eosinophils Relative: 9 % — ABNORMAL HIGH (ref 0–5)
HCT: 38 % (ref 36.0–46.0)
Hemoglobin: 12.3 g/dL (ref 12.0–15.0)
Lymphocytes Relative: 28 % (ref 12–46)
Lymphs Abs: 2.2 10*3/uL (ref 0.7–4.0)
MCH: 24.2 pg — ABNORMAL LOW (ref 26.0–34.0)
MCHC: 32.4 g/dL (ref 30.0–36.0)
MCV: 74.7 fL — ABNORMAL LOW (ref 78.0–100.0)
Monocytes Absolute: 0.5 10*3/uL (ref 0.1–1.0)
Monocytes Relative: 6 % (ref 3–12)
Neutro Abs: 4.2 10*3/uL (ref 1.7–7.7)
Neutrophils Relative %: 56 % (ref 43–77)
Platelets: 235 10*3/uL (ref 150–400)
RBC: 5.09 MIL/uL (ref 3.87–5.11)
RDW: 14.6 % (ref 11.5–15.5)
WBC: 7.7 10*3/uL (ref 4.0–10.5)

## 2012-06-09 LAB — ETHANOL: Alcohol, Ethyl (B): <11 mg/dL (ref 0–11)

## 2012-06-09 LAB — POCT I-STAT, CHEM 8
BUN: 5 mg/dL — ABNORMAL LOW (ref 6–23)
Calcium, Ion: 1.19 mmol/L (ref 1.12–1.23)
Chloride: 100 mEq/L (ref 96–112)
Creatinine, Ser: 0.8 mg/dL (ref 0.50–1.10)
Glucose, Bld: 104 mg/dL — ABNORMAL HIGH (ref 70–99)
HCT: 40 % (ref 36.0–46.0)
Hemoglobin: 13.6 g/dL (ref 12.0–15.0)
Potassium: 3.6 mEq/L (ref 3.5–5.1)
Sodium: 139 mEq/L (ref 135–145)
TCO2: 25 mmol/L (ref 0–100)

## 2012-06-09 NOTE — ED Notes (Signed)
Patient requesting [place] sandwich.  Informed patient that we need a urine sample and that we can have something to eat and drink after lab results are back.  Patient verbalized understanding.

## 2012-06-09 NOTE — ED Notes (Signed)
Patient complaining of insomnia that started several days ago; denies suicidal and homicidal ideations.  Patient states, "I just can't fall asleep at my apartment".  Patient states that she has been staying by herself and hates being alone -- has been avoiding her apartment because of this reason.  Patient alert and oriented x4; PERRL present.  Dr. Nicanor Alcon at bedside; will continue to monitor.

## 2012-06-09 NOTE — ED Notes (Signed)
Patient currently asleep in bed; no respiratory or acute distress noted.  Currently waiting for further orders/disposition from EDP.  Will continue to monitor.

## 2012-06-09 NOTE — ED Notes (Signed)
ACT team at bedside.  

## 2012-06-09 NOTE — ED Provider Notes (Signed)
History     CSN: [id number]  Arrival date & time 06/08/12  2234   First MD Initiated Contact with Patient 06/09/12 0044      Chief Complaint  Patient presents with  . Insomnia    (Consider location/radiation/quality/duration/timing/severity/associated sxs/prior treatment) Patient is a 61 y.o. female presenting with mental health disorder. The history is provided by the patient. No language interpreter was used.  Mental Health Problem The primary symptoms do not include dysphoric mood, delusions, hallucinations, bizarre behavior, disorganized speech, negative symptoms or somatic symptoms. The current episode started more than 2 weeks ago.  The degree of incapacity that she is experiencing as a consequence of her illness is mild. Additional symptoms of the illness include insomnia. Additional symptoms of the illness do not include no feelings of worthlessness, no attention impairment, no euphoric mood, no increased goal-directed activity, no flight of ideas, no headaches or no abdominal pain. She does not admit to suicidal ideas. She does not have a plan to commit suicide. She does not contemplate harming herself. She has not already injured self. She does not contemplate injuring another person. She has not already  injured another person. Risk factors that are present for mental illness include a history of mental illness.  States she is not taking her meds but has an appointment with her doctor who writes for her meds this morning but does not want to take her medications and thinks they are too strong.    Past Medical History  Diagnosis Date  . Hypertension   . Diabetes mellitus   . Hyperlipidemia   . Depression   . Anxiety   . Schizophrenia     Past Surgical History  Procedure Date  . Breast surgery     No family history on file.  History  Substance Use Topics  . Smoking status: Current Everyday Smoker -- 1.0 packs/day for 40 years    Types: Cigarettes  . Smokeless  tobacco: Never Used  . Alcohol Use: No    OB History    Grav Para Term Preterm Abortions TAB SAB Ect Mult Living   0 0 0 0 0 0 0 0 0 0       Review of Systems  Constitutional: Negative for fever.  Respiratory: Negative for chest tightness, shortness of breath and wheezing.   Cardiovascular: Negative for chest pain and leg swelling.  Gastrointestinal: Negative for abdominal pain.  Neurological: Negative for weakness, numbness and headaches.  Psychiatric/Behavioral: Negative for hallucinations and dysphoric mood. The patient has insomnia.   All other systems reviewed and are negative.    Allergies  Review of patient's allergies indicates no known allergies.  Home Medications   Current Outpatient Rx  Name Route Sig Dispense Refill  . AMLODIPINE BESYLATE 5 MG PO TABS Oral Take 1 tablet (5 mg total) by mouth daily. For blood pressure. 30 tablet 0  . ASPIRIN 81 MG PO TBEC Oral Take 1 tablet (81 mg total) by mouth daily. As a blood thinner. 30 tablet 0  . BENZTROPINE MESYLATE 1 MG PO TABS Oral Take 1 tablet (1 mg total) by mouth 2 (two) times daily in the am and at bedtime.. For EPS. 60 tablet 0  . HYDROCHLOROTHIAZIDE 12.5 MG PO CAPS Oral Take 1 capsule (12.5 mg total) by mouth daily. For blood pressure control. 30 capsule 0  . IRBESARTAN 150 MG PO TABS Oral Take 1 tablet (150 mg total) by mouth daily. For blood sugar control. 30 tablet 0  . PANTOPRAZOLE SODIUM  40 MG PO TBEC Oral Take 1 tablet (40 mg total) by mouth 2 (two) times daily before a meal. For reflux. 60 tablet 0  . PERPHENAZINE 8 MG PO TABS Oral Take 1 tablet (8 mg total) by mouth 2 (two) times daily in the am and at bedtime.. For psychosis. 60 tablet 0  . PIOGLITAZONE HCL 15 MG PO TABS Oral Take 1 tablet (15 mg total) by mouth daily. For blood sugar control. 30 tablet 0  . PRAMOXINE HCL 1 % RE FOAM Rectal Place 1 application rectally every 4 (four) hours as needed. For hemorrhoids. 15 g 0  . SIMVASTATIN 20 MG PO TABS  Take  one tablet every evening for elevated cholesterol. 30 tablet 0  . TRAZODONE HCL 150 MG PO TABS Oral Take 1 tablet (150 mg total) by mouth at bedtime. For insomnia. 30 tablet 0    BP 140/75  Temp 98.3 F (36.8 C) (Oral)  Resp 18  SpO2 95%  Physical Exam  Constitutional: She is oriented to person, place, and time. She appears well-developed and well-nourished. No distress.  HENT:  Head: Normocephalic and atraumatic.  Mouth/Throat: Oropharynx is clear and moist.  Eyes: Conjunctivae are normal. Pupils are equal, round, and reactive to light.  Neck: Normal range of motion. Neck supple.  Cardiovascular: Normal rate and regular rhythm.   Pulmonary/Chest: Effort normal and breath sounds normal. She has no wheezes. She has no rales.  Abdominal: Soft. Bowel sounds are normal. There is no tenderness. There is no rebound and no guarding.  Musculoskeletal: Normal range of motion. She exhibits no edema.  Neurological: She is alert and oriented to person, place, and time.  Skin: Skin is warm and dry.  Psychiatric: She has a normal mood and affect.    ED Course  Procedures (including critical care time)  Labs Reviewed  CBC WITH DIFFERENTIAL - Abnormal; Notable for the following:    MCV 74.7 (*)     MCH 24.2 (*)     Eosinophils Relative 9 (*)     All other components within normal limits  POCT I-STAT, CHEM 8 - Abnormal; Notable for the following:    BUN 5 (*)     Glucose, Bld 104 (*)     All other components within normal limits  ETHANOL  URINE RAPID DRUG SCREEN (HOSP PERFORMED)   No results found.   No diagnosis found.    MDM  Seen by act team.  No admission indication.  Follow up with your therapist and PMD in the am for medication adjust ment        Giani Betzold K Viki Carrera-Rasch, MD 06/09/12 [phone]

## 2012-06-09 NOTE — BH Assessment (Signed)
Assessment Note   Shannon [name] is an 61 y.o. female. Pt reports she was recently in behavioral health but was discharged.  States she has not been taking her meds, medical or psych, because she is confused about them and they also make her feel strange.  Discussed how pt is feeling and pt states she feels fine, not depressed, denies SI/HI/AV.  Denies any major stress in her life.  States she does not like to stay alone in her apartment anymore, feels someone will break in.  Talked with pt about talking with her Doctors, both medical and psych about her medications and pt agreed to do this.  Plans to see medical provider, Dr Tye Savoy, tomorrow, and will call psych provider, [facility] tomorrow for appt as well.  Axis I: deferred Axis II: Deferred Axis III:  Past Medical History  Diagnosis Date  . Hypertension   . Diabetes mellitus   . Hyperlipidemia   . Depression   . Anxiety   . Schizophrenia    Axis IV: none reported Axis V: 61-70 mild symptoms  Past Medical History:  Past Medical History  Diagnosis Date  . Hypertension   . Diabetes mellitus   . Hyperlipidemia   . Depression   . Anxiety   . Schizophrenia     Past Surgical History  Procedure Date  . Breast surgery     Family History: No family history on file.  Social History:  reports that she has been smoking Cigarettes.  She has a 40 pack-year smoking history. She has never used smokeless tobacco. She reports that she does not drink alcohol or use illicit drugs.  Additional Social History:  Alcohol / Drug Use Pain Medications: Pt denies any use of alcohol or drugs. History of alcohol / drug use?: No history of alcohol / drug abuse  CIWA: CIWA-Ar BP: 140/75 mmHg COWS:    Allergies: No Known Allergies  Home Medications:  (Not in a hospital admission)  OB/GYN Status:  No LMP recorded. Patient is postmenopausal.  General Assessment Data Location of Assessment: [facility] ED ACT Assessment: Yes Living  Arrangements: Alone Can pt return to current living arrangement?: Yes     Risk to self Suicidal Ideation: No Suicidal Intent: No Is patient at risk for suicide?: No Suicidal Plan?: No Access to Means: No What has been your use of drugs/alcohol within the last 12 months?: pt denies use Previous Attempts/Gestures: No Intentional Self Injurious Behavior: None Family Suicide History: No Recent stressful life event(s):  (pt reports no stressors) Persecutory voices/beliefs?: No Depression: No Substance abuse history and/or treatment for substance abuse?: No Suicide prevention information given to non-admitted patients: Yes  Risk to Others Homicidal Ideation: No Thoughts of Harm to Others: No Current Homicidal Intent: No Current Homicidal Plan: No Access to Homicidal Means: No History of harm to others?: No Assessment of Violence: None Noted Does patient have access to weapons?: No Criminal Charges Pending?: No Does patient have a court date: No  Psychosis Hallucinations: None noted Delusions: None noted  Mental Status Report Appear/Hygiene: Disheveled Eye Contact: Fair Motor Activity: Unremarkable Speech: Logical/coherent Level of Consciousness: Alert Mood: Other (Comment) (cooperative) Affect: Appropriate to circumstance Anxiety Level: Minimal Thought Processes: Relevant Judgement: Unimpaired Orientation: Person;Place;Time;Situation Obsessive Compulsive Thoughts/Behaviors: None  Cognitive Functioning Concentration: Normal IQ: Average Insight: Fair Impulse Control: Fair Appetite: Poor Weight Loss: 15  Weight Gain: 0  Sleep: No Change Total Hours of Sleep: 11  Vegetative Symptoms: None  ADLScreening [facility] Assessment Services)  Patient's cognitive ability adequate to safely complete daily activities?: Yes Patient able to express need for assistance with ADLs?: Yes Independently performs ADLs?: Yes (appropriate for developmental age)  Abuse/Neglect  [facility]) Physical Abuse: Denies Verbal Abuse: Denies Sexual Abuse: Denies  Prior Inpatient Therapy Prior Inpatient Therapy: Yes Prior Therapy Dates: [facility] Prior Therapy Facilty/Provider(s): 2013 at [facility] Reason for Treatment: psych  Prior Outpatient Therapy Prior Outpatient Therapy: Yes Prior Therapy Dates: ongoing Prior Therapy Facilty/Provider(s): [facility] Reason for Treatment: medication management  ADL Screening (condition at time of admission) Patient's cognitive ability adequate to safely complete daily activities?: Yes Patient able to express need for assistance with ADLs?: Yes Independently performs ADLs?: Yes (appropriate for developmental age) Weakness of Legs: None Weakness of Arms/Hands: None  Home Assistive Devices/Equipment Home Assistive Devices/Equipment: None    Abuse/Neglect Assessment (Assessment to be complete while patient is alone) Physical Abuse: Denies Verbal Abuse: Denies Sexual Abuse: Denies Exploitation of patient/patient's resources: Denies Self-Neglect: Denies Values / Beliefs Cultural Requests During Hospitalization: None Spiritual Requests During Hospitalization: None   Advance Directives (For Healthcare) Advance Directive: Patient does not have advance directive;Patient would not like information    Additional Information 1:1 In Past 12 Months?: No CIRT Risk: No Elopement Risk: No Does patient have medical clearance?: Yes     Disposition: Discussed this pt with Dr Saralyn Pilar of [facility] who agrees that pt does not meet criteria for inpt hospitalization.  Pt referred to current provider, [facility]. Disposition Disposition of Patient: Other dispositions Other disposition(s): To current provider Patient referred to: Other (Comment) ([facility])  On Site Evaluation by:   Reviewed with Physician:     Lorri Frederick 06/09/2012 1:52 AM

## 2012-06-09 NOTE — ED Notes (Signed)
Patient given discharge paperwork; went over discharge paperwork with patient.  Instructed patient to follow up with referral/primary care physician as soon as possible (instructed to call in the morning).  Patient verbalized understanding.  Instructed patient to return to the ED for new, worsening, or concerning symptoms.

## 2012-06-16 ENCOUNTER — Ambulatory Visit: Payer: Self-pay | Admitting: Advanced Practice Midwife

## 2012-06-19 ENCOUNTER — Ambulatory Visit (INDEPENDENT_AMBULATORY_CARE_PROVIDER_SITE_OTHER): Payer: Medicaid Other | Admitting: Obstetrics & Gynecology

## 2012-06-19 ENCOUNTER — Encounter: Payer: Self-pay | Admitting: Obstetrics & Gynecology

## 2012-06-19 VITALS — BP 159/82 | HR 79 | Temp 97.2°F | Ht 63.0 in | Wt 156.5 lb

## 2012-06-19 DIAGNOSIS — N952 Postmenopausal atrophic vaginitis: Secondary | ICD-10-CM

## 2012-06-19 MED ORDER — ESTROGENS, CONJUGATED 0.625 MG/GM VA CREA: TOPICAL_CREAM | Freq: Every day | VAGINAL | Status: DC

## 2012-06-19 NOTE — Progress Notes (Signed)
States since seen in December for vaginal burning that it still comes and goes and has not had intercourse.

## 2012-06-19 NOTE — Progress Notes (Signed)
Subjective:     Patient ID: Shannon [name], female   DOB: Apr 28, 1951, 61 y.o.   MRN: [id number]  HPI Pt c/o vaginal burning.  Not sexually active since 2007.  Denies vaginal discharge or pelvic pain.  Sx have been present for an unknown period of time >1year     Review of Systems  N/C        Objective:   Physical Exam BP 159/82  Pulse 79  Temp 97.2 F (36.2 C)  Ht 5\' 3"  (1.6 m)  Wt 156 lb 8 oz (70.988 kg)  BMI 27.72 kg/m2  GU: EGBUS: no lesions Vagina: no blood in vault, dry mucous membranes Cervix: no lesion; no mucopurulent d/c       Assessment:     Atrophic vaginitis    Plan:     Premarin cream- will use 1 month unopposed. If sx improve pt will consider po EES with Provera F/u 1 month or sooner prn  Jaison Petraglia L. Harraway-Smith, M.D., [facility]

## 2012-06-19 NOTE — Patient Instructions (Signed)
Atrophic Vaginitis  Atrophic vaginitis is a problem of low levels of estrogen in women. This problem can happen at any age. It is most common in women who have gone through menopause ("the change").    HOW WILL I KNOW IF I HAVE THIS PROBLEM?  You may have:   Trouble with peeing (urinating), such as:   Going to the bathroom often.   A hard time holding your pee until you reach a bathroom.   Leaking pee.   Having pain when you pee.   Itching or a burning feeling.   Vaginal bleeding and spotting.   Pain during sex.   Dryness of the vagina.   A yellow, bad-smelling fluid (discharge) coming from the vagina.  HOW WILL MY DOCTOR CHECK FOR THIS PROBLEM?   During your exam, your doctor will likely find the problem.   If there is a vaginal fluid, it may be checked for infection.  HOW WILL THIS PROBLEM BE TREATED?  Keep the vulvar skin as clean as possible. Moisturizers and lubricants can help with some of the symptoms.  Estrogen replacement can help. There are 2 ways to take estrogen:   Systemic estrogen gets estrogen to your whole body. It takes many weeks or months before the symptoms get better.   You take an estrogen pill.   You use a skin patch. This is a patch that you put on your skin.   If you still have your uterus, your doctor may ask you to take a hormone. Talk to your doctor about the right medicine for you.   Estrogen cream.  This puts estrogen only at the part of your body where you apply it. The cream is put into the vagina or put on the vulvar skin. For some women, estrogen cream works faster than pills or the patch.  CAN ALL WOMEN WITH THIS PROBLEM USE ESTROGEN?  No. Women with certain types of cancer, liver problems, or problems with blood clots should not take estrogen. Your doctor can help you decide the best treatment for your symptoms.  Document Released: 03/26/2008 Document Revised: 09/27/2011 Document Reviewed: 03/26/2008  Exit[employer] Patient Information 2012 [employer].

## 2012-06-25 ENCOUNTER — Ambulatory Visit: Payer: Self-pay | Admitting: Advanced Practice Midwife

## 2012-07-06 NOTE — Discharge Summary (Signed)
Physician Discharge Summary Note  Patient:  Shannon [name] is an 61 y.o., female MRN:  [id number] DOB:  04-25-1951 Patient phone:  [phone] (home)  Patient address:   [address] [place] [address]   Date of Admission:  05/24/2012 Date of Discharge: 05/29/2012  Discharge Diagnoses: Principal Problem:  *Schizophrenia, paranoid type  Axis Diagnosis:  AXIS I: Schizophrenia - Paranoid Type.  AXIS II: Deferred.  AXIS III: 1. Hypertension.  2. Diabetes Mellitus.  3. Hyperlipidemia.  AXIS IV: Long History of Mental Illness. Non-psychiatric health issues. Limited Primary Support.  AXIS V: GAF at time of admission approximately 35. GAF at time of discharge approximately 55.   Level of Care:  Inpatient hospitalization.  Reason for Admission:  This is a voluntary admission for Shannon [name] who is well known to [facility]. She presented to the ED at [facility] reporting that she is seeing spirits in her house again and even if she missed "1or2" doses of medication, she was too nervous to stay there alone.  Hospital Course:  The patient attended treatment team meeting this am and met with treatment team members. The patient's symptoms, treatment plan and response to treatment was discussed. The patient endorsed that Shannon [name] symptoms have improved. The patient also stated that they felt stable for discharge.  They reported that from this hospital stay they had learned many coping skills.  In other to maintain Shannon [name] psychiatric stability, they will continue psychiatric care on an outpatient basis. They will follow-up as outlined below.  In addition they were instructed  to take all your medications as prescribed by Shannon [name] mental healthcare provider and to report any adverse effects and or reactions from your medicines to Shannon [name] outpatient provider promptly.  The patient is also instructed and cautioned to not engage in alcohol and or illegal drug use while on prescription medicines.  In the event of worsening  symptoms the patient is instructed to call the crisis hotline, 911 and or go to the nearest ED for appropriate evaluation and treatment of symptoms.   Also while a patient in this hospital, the patient received medication management for his psychiatric symptoms. They were ordered and received as outlined below:    Medication List     As of 07/06/2012  4:23 PM    STOP taking these medications         olmesartan-hydrochlorothiazide 20-12.5 MG per tablet   Commonly known as: BENICAR HCT      omeprazole 20 MG capsule   Commonly known as: PRILOSEC      TAKE these medications      Indication    amLODipine 5 MG tablet   Commonly known as: NORVASC   Take 1 tablet (5 mg total) by mouth daily. For blood pressure.       aspirin 81 MG EC tablet   Take 1 tablet (81 mg total) by mouth daily. As a blood thinner.       benztropine 1 MG tablet   Commonly known as: COGENTIN   Take 1 tablet (1 mg total) by mouth 2 (two) times daily in the am and at bedtime.. For EPS.       hydrochlorothiazide 12.5 MG capsule   Commonly known as: MICROZIDE   Take 1 capsule (12.5 mg total) by mouth daily. For blood pressure control.       irbesartan 150 MG tablet   Commonly known as: AVAPRO   Take 1 tablet (150 mg total) by mouth daily. For blood  sugar control.       pantoprazole 40 MG tablet   Commonly known as: PROTONIX   Take 1 tablet (40 mg total) by mouth 2 (two) times daily before a meal. For reflux.       perphenazine 8 MG tablet   Commonly known as: TRILAFON   Take 1 tablet (8 mg total) by mouth 2 (two) times daily in the am and at bedtime.. For psychosis.       pioglitazone 15 MG tablet   Commonly known as: ACTOS   Take 1 tablet (15 mg total) by mouth daily. For blood sugar control.       simvastatin 20 MG tablet   Commonly known as: ZOCOR   Take one tablet every evening for elevated cholesterol.    Indication: Type II B Hyperlipidemia      traZODone 150 MG tablet   Commonly known as:  DESYREL   Take 1 tablet (150 mg total) by mouth at bedtime. For insomnia.    Indication: Trouble Sleeping       They were also enrolled in group counseling sessions and activities in which they participated actively.        Follow-up Information    [facility] with [facility] on 06/04/2012. (Appt with Dr. Robinette Haines at 2:45pm.  Please call to  terminate if you choose to cancel this appt with your current provider.)    Contact information:   [address] [place], [place] [address] phone [phone] fax [phone]      Follow up with [facility] on 05/30/2012. (Please Follow-up with the walk-in clinic M-F 8-3pm.)    Contact information:   [address] [place], [place] [address] [phone]        Upon discharge, patient adamantly denies suicidal, homicidal ideations, auditory, visual hallucinations and or delusional thinking. They [facility] with all personal belongings via personal transportation in no apparent distress.  Consults:  Please see the patient's electronic medical record for more details.  Significant Diagnostic Studies:  Please see the patient's electronic medical record for more details.  Discharge Vitals:   Blood pressure 111/66, pulse 88, temperature 98.6 F (37 C), temperature source Oral, resp. rate 17, height 5\' 3"  (1.6 m), weight 64.411 kg (142 lb), SpO2 99.00%..  Mental Status Exam: Demographic factors:  Divorced or widowed;Living alone;Unemployed  Current Mental Status Per Nursing Assessment::  On Admission: (denies si/hi)  At Discharge: Time was spent today discussing with the patient her current symptoms. The patient reports to sleeping well last night since increasing the Trazodone. She reports a good appetite today and denies any significant feelings of sadness, anhedonia or depressed mood. She adamantly denies any suicidal or homicidal ideations. She denies any anxiety symptoms and denies any auditory or visual hallucinations or  delusional thinking. She states that she feels safe to return home and would like to be discharged today. The patient will be discharged to outpatient follow up per her request.  Current Mental Status Per Physician:  Diagnosis:  Axis I: Schizophrenia - Paranoid Type.  The patient was seen today and reports the following:  ADL's: Intact.  Sleep: The patient reports to sleeping well last night with her current medications.  Appetite: The patient reports a good appetite today.  Mild>(1-10) >Severe  Hopelessness (1-10): 0  Depression (1-10): 0  Anxiety (1-10): 0  Suicidal Ideation: The patient adamantly denies any current suicidal ideations today.  Plan: No  Intent: No  Means: No  Homicidal Ideation:  The patient adamantly denies any homicidal ideations today.  Plan: No  Intent: No.  Means: No  General Appearance/Behavior: The patient remained friendly and cooperative today during the evaluation with this provider.  Eye Contact: Good.  Speech: Appropriate in rate and volume with no pressuring noted today.  Motor Behavior: wnl.  Level of Consciousness: Alert and Oriented x 3.  Mental Status: Alert and Oriented x 3.  Mood: Essentially Euthymic.  Affect: Bright and Full.  Anxiety Level: No anxiety reported today.  Thought Process: wnl.  Thought Content: The patient denies any auditory or visual hallucinations today. She denies any delusional thinking today and feel safe to return home.  Perception:. wnl.  Judgment: Fair to Good.  Insight: Fair to Good.  Cognition: Oriented to person, place and time.  Current Medications:  Current Facility-Administered Medications   Medication  Dose  Route  Frequency  Provider  Last Rate  Last Dose   .  acetaminophen (TYLENOL) tablet 650 mg  650 mg  Oral  Q6H PRN  Verne Spurr, PA-C     .  alum & mag hydroxide-simeth (MAALOX/MYLANTA) 200-200-20 MG/5ML suspension 30 mL  30 mL  Oral  Q4H PRN  Verne Spurr, PA-C     .  amLODipine (NORVASC) tablet 5 mg   5 mg  Oral  Daily  Curlene Labrum Shyne Lehrke, MD   5 mg at 05/27/12 0800   .  aspirin EC tablet 81 mg  81 mg  Oral  Daily  Curlene Labrum Que Meneely, MD   81 mg at 05/27/12 0800   .  benztropine (COGENTIN) tablet 1 mg  1 mg  Oral  BH-qamhs  Callaghan Laverdure D Zamir Staples, MD   1 mg at 05/27/12 0800   .  hydrochlorothiazide (MICROZIDE) capsule 12.5 mg  12.5 mg  Oral  Daily  Curlene Labrum Aldine Grainger, MD   12.5 mg at 05/27/12 0800   .  irbesartan (AVAPRO) tablet 150 mg  150 mg  Oral  Daily  Curlene Labrum Deepti Gunawan, MD   150 mg at 05/27/12 0800   .  magnesium hydroxide (MILK OF MAGNESIA) suspension 30 mL  30 mL  Oral  Daily PRN  Verne Spurr, PA-C     .  pantoprazole (PROTONIX) EC tablet 40 mg  40 mg  Oral  BID AC  Curlene Labrum Gilmore List, MD   40 mg at 05/27/12 1709   .  perphenazine (TRILAFON) tablet 8 mg  8 mg  Oral  BH-qamhs  Laylee Schooley D Dymond Gutt, MD     .  pioglitazone (ACTOS) tablet 15 mg  15 mg  Oral  Daily  Curlene Labrum Malayiah Mcbrayer, MD   15 mg at 05/27/12 0800   .  simvastatin (ZOCOR) tablet 20 mg  20 mg  Oral  QHS  Curlene Labrum Makenli Derstine, MD   20 mg at 05/26/12 2135   .  traZODone (DESYREL) tablet 150 mg  150 mg  Oral  QHS  Curlene Labrum Amdrew Oboyle, MD     .  DISCONTD: perphenazine (TRILAFON) tablet 8 mg  8 mg  Oral  QHS  Curlene Labrum Dale Strausser, MD   8 mg at 05/26/12 2135   .  DISCONTD: traZODone (DESYREL) tablet 100 mg  100 mg  Oral  QHS  Curlene Labrum Kadince Boxley, MD   100 mg at 05/26/12 2135   Lab Results:  Results for orders placed during the hospital encounter of 05/24/12 (from the past 48 hour(s))   GLUCOSE, CAPILLARY Status: Abnormal    Collection Time  05/26/12 6:40 AM   Component  Value  Range  Comment    Glucose-Capillary  157 (*)  70 - 99 mg/dL    Loss Factors:  Decrease in vocational status  Historical Factors:  Family history of mental illness or substance abuse Long history of mental illness. History of non-compliance with medications.  Risk Reduction Factors:  Good access to healthcare.  Continued Clinical Symptoms:  Schizophrenia: Paranoid or undifferentiated  type  Previous Psychiatric Diagnoses and Treatments  Medical Diagnoses and Treatments/Surgeries  Discharge Diagnoses:  AXIS I: Schizophrenia - Paranoid Type.  AXIS II: Deferred.  AXIS III: 1. Hypertension.  2. Diabetes Mellitus.  3. Hyperlipidemia.  AXIS IV: Long History of Mental Illness. Non-psychiatric health issues. Limited Primary Support.  AXIS V: GAF at time of admission approximately 35. GAF at time of discharge approximately 55.  Cognitive Features That Contribute To Risk:  None Noted.  Physical Findings:  AIMS: Facial and Oral Movements  Muscles of Facial Expression: None, normal  Lips and Perioral Area: None, normal  Jaw: None, normal  Tongue: None, normal,Extremity Movements  Upper (arms, wrists, hands, fingers): None, normal  Lower (legs, knees, ankles, toes): None, normal, Trunk Movements  Neck, shoulders, hips: None, normal, Overall Severity  Severity of abnormal movements (highest score from questions above): None, normal  Incapacitation due to abnormal movements: None, normal  Patient's awareness of abnormal movements (rate only patient's report): No Awareness, Dental Status  Current problems with teeth and/or dentures?: No  Does patient usually wear dentures?: Yes  CIWA: CIWA-Ar Total: 0  COWS: COWS Total Score: 1  Review of Systems:  Neurological: The patient denies any headaches today. She denies any seizures or dizziness.  G.I.: The patient denies any constipation today or G.I. Upset.  Musculoskeletal: The patient denies any muscle or skeletal difficulties.  GU: No enuresis reported.  Time was spent today discussing with the patient her current symptoms. The patient reports to sleeping well last night since increasing the Trazodone. She reports a good appetite today and denies any significant feelings of sadness, anhedonia or depressed mood. She adamantly denies any suicidal or homicidal ideations. She denies any anxiety symptoms and denies any auditory or  visual hallucinations or delusional thinking. She states that she feels safe to return home and would like to be discharged today. The patient will be discharged to outpatient follow up per her request.  Treatment Plan Summary:  1. Daily contact with patient to assess and evaluate symptoms and progress in treatment.  2. Medication management  3. The patient will deny suicidal ideations or homicidal ideations for 48 hours prior to discharge and have a depression and anxiety rating of 3 or less. The patient will also deny any auditory or visual hallucinations or delusional thinking.  4. The patient will deny any symptoms of substance withdrawal at time of discharge.  Plan:  1. Will continue the patient on her current non-psychiatric medications.  2. Will continue the medication Trilafon at 8 mgs po q am and qhs to address her psychosis.  3. Will continue the medication Cogentin 1 mg po q am and hs for EPS.  4. Will continue the medication Trazodone to 150 mgs po qhs for sleep..  5. Laboratory studies reviewed.  6. Will continue to monitor.  7. Will discharge the patient today at her request.  Suicide Risk:  Minimal: No identifiable suicidal ideation. Patients presenting with no risk factors but with morbid ruminations; may be classified as minimal risk based on the severity  of the depressive symptoms  Plan Of Care/Follow-up recommendations:  Activity: As tolerated.  Diet: Heart Healthy Diet.  Other: Please take all medications only as directed and keep all scheduled follow up appointments.  Discharge destination:  Home  Is patient on multiple antipsychotic therapies at discharge:  No  Has Patient had three or more failed trials of antipsychotic monotherapy by history: N/A Recommended Plan for Multiple Antipsychotic Therapies: N/A Discharge Orders    Future Orders Please Complete By Expires   Diet - low sodium heart healthy      Increase activity slowly      Discharge instructions       Comments:   Please take all medications only as directed and keep all scheduled follow up appointments.       Medication List     As of 07/06/2012  4:23 PM    STOP taking these medications         olmesartan-hydrochlorothiazide 20-12.5 MG per tablet   Commonly known as: BENICAR HCT      omeprazole 20 MG capsule   Commonly known as: PRILOSEC      TAKE these medications      Indication    amLODipine 5 MG tablet   Commonly known as: NORVASC   Take 1 tablet (5 mg total) by mouth daily. For blood pressure.       aspirin 81 MG EC tablet   Take 1 tablet (81 mg total) by mouth daily. As a blood thinner.       benztropine 1 MG tablet   Commonly known as: COGENTIN   Take 1 tablet (1 mg total) by mouth 2 (two) times daily in the am and at bedtime.. For EPS.       hydrochlorothiazide 12.5 MG capsule   Commonly known as: MICROZIDE   Take 1 capsule (12.5 mg total) by mouth daily. For blood pressure control.       irbesartan 150 MG tablet   Commonly known as: AVAPRO   Take 1 tablet (150 mg total) by mouth daily. For blood sugar control.       pantoprazole 40 MG tablet   Commonly known as: PROTONIX   Take 1 tablet (40 mg total) by mouth 2 (two) times daily before a meal. For reflux.       perphenazine 8 MG tablet   Commonly known as: TRILAFON   Take 1 tablet (8 mg total) by mouth 2 (two) times daily in the am and at bedtime.. For psychosis.       pioglitazone 15 MG tablet   Commonly known as: ACTOS   Take 1 tablet (15 mg total) by mouth daily. For blood sugar control.       simvastatin 20 MG tablet   Commonly known as: ZOCOR   Take one tablet every evening for elevated cholesterol.    Indication: Type II B Hyperlipidemia      traZODone 150 MG tablet   Commonly known as: DESYREL   Take 1 tablet (150 mg total) by mouth at bedtime. For insomnia.    Indication: Trouble Sleeping           Follow-up Information    Follow up with [facility] on 06/04/2012. (Appt  with Dr. Robinette Haines at 2:45pm.  Please call to  terminate if you choose to cancel this appt with your current provider.)    Contact information:   [address] [place], [place] [address] phone [phone] fax [phone]  Follow up with [facility] on 05/30/2012. (Please Follow-up with the walk-in clinic M-F 8-3pm.)    Contact information:   [address] [place], [place] [address] [phone]        Follow-up recommendations:   Activities: Resume typical activities Diet: Resume typical diet Other: Follow up with outpatient provider and report any side effects to out patient prescriber.  Comments:  Take all your medications as prescribed by your mental healthcare provider. Report any adverse effects and or reactions from your medicines to your outpatient provider promptly. Patient is instructed and cautioned to not engage in alcohol and or illegal drug use while on prescription medicines. In the event of worsening symptoms, patient is instructed to call the crisis hotline, 911 and or go to the nearest ED for appropriate evaluation and treatment of symptoms.  Signed: Franchot Gallo 07/06/2012 4:23 PM

## 2012-07-15 ENCOUNTER — Other Ambulatory Visit: Payer: Self-pay | Admitting: Obstetrics & Gynecology

## 2012-07-26 ENCOUNTER — Emergency Department (HOSPITAL_COMMUNITY)
Admission: EM | Admit: 2012-07-26 | Discharge: 2012-07-26 | Disposition: A | Discharge status: Home / Self Care | Payer: Medicaid Other | Attending: Emergency Medicine | Admitting: Emergency Medicine

## 2012-07-26 ENCOUNTER — Encounter (HOSPITAL_COMMUNITY): Payer: Self-pay | Admitting: Emergency Medicine

## 2012-07-26 DIAGNOSIS — E119 Type 2 diabetes mellitus without complications: Secondary | ICD-10-CM | POA: Insufficient documentation

## 2012-07-26 DIAGNOSIS — IMO0001 Reserved for inherently not codable concepts without codable children: Secondary | ICD-10-CM | POA: Insufficient documentation

## 2012-07-26 DIAGNOSIS — I10 Essential (primary) hypertension: Secondary | ICD-10-CM | POA: Insufficient documentation

## 2012-07-26 DIAGNOSIS — S40269A Insect bite (nonvenomous) of unspecified shoulder, initial encounter: Secondary | ICD-10-CM | POA: Insufficient documentation

## 2012-07-26 DIAGNOSIS — E785 Hyperlipidemia, unspecified: Secondary | ICD-10-CM | POA: Insufficient documentation

## 2012-07-26 DIAGNOSIS — F3289 Other specified depressive episodes: Secondary | ICD-10-CM | POA: Insufficient documentation

## 2012-07-26 DIAGNOSIS — F411 Generalized anxiety disorder: Secondary | ICD-10-CM | POA: Insufficient documentation

## 2012-07-26 DIAGNOSIS — F209 Schizophrenia, unspecified: Secondary | ICD-10-CM | POA: Insufficient documentation

## 2012-07-26 DIAGNOSIS — W57XXXA Bitten or stung by nonvenomous insect and other nonvenomous arthropods, initial encounter: Secondary | ICD-10-CM | POA: Insufficient documentation

## 2012-07-26 DIAGNOSIS — F329 Major depressive disorder, single episode, unspecified: Secondary | ICD-10-CM | POA: Insufficient documentation

## 2012-07-26 DIAGNOSIS — Z87891 Personal history of nicotine dependence: Secondary | ICD-10-CM | POA: Insufficient documentation

## 2012-07-26 MED ORDER — PERMETHRIN 5 % EX CREA: TOPICAL_CREAM | CUTANEOUS | Status: DC

## 2012-07-26 NOTE — ED Notes (Addendum)
Pt c/o itching to arms and legs. Pt denies seeing any bugs on her arms or legs. Pt c/o itching to area. No rash noted. Pt spraying Dermaplast pain reliever on her arms.

## 2012-07-26 NOTE — ED Provider Notes (Signed)
History     CSN: [id number]  Arrival date & time 07/26/12  2124   First MD Initiated Contact with Patient 07/26/12 2227      Chief Complaint  Patient presents with  . Insect Bite    (Consider location/radiation/quality/duration/timing/severity/associated sxs/prior treatment) HPI  Patient presents to the emergency department with complaints of bug bites to her arms and hands. She says that people who live in her house and complex also have them. She states that nobody knows what it is and nobody has been treating it. She denies seeing any bugs. She says that it is very itchy and she has not been up to keep herself from scratching. She denies any pus coming from the area, having nausea, diarrhea, fevers, weakness, chills.  Past Medical History  Diagnosis Date  . Hypertension   . Diabetes mellitus   . Hyperlipidemia   . Depression   . Anxiety   . Schizophrenia     Past Surgical History  Procedure Date  . Breast surgery     Family History  Problem Relation Age of Onset  . Kidney disease Mother     History  Substance Use Topics  . Smoking status: Former Smoker -- 1.0 packs/day for 40 years    Types: Cigarettes  . Smokeless tobacco: Never Used  . Alcohol Use: No    OB History    Grav Para Term Preterm Abortions TAB SAB Ect Mult Living   0 0 0 0 0 0 0 0 0 0       Review of Systems  Review of Systems  Gen: no weight loss, fevers, chills, night sweats  Eyes: no discharge or drainage, no occular pain or visual changes  Nose: no epistaxis or rhinorrhea  Mouth: no dental pain, no sore throat  Neck: no neck pain  Lungs:No wheezing, coughing or hemoptysis CV: no chest pain, palpitations, dependent edema or orthopnea  Abd: no abdominal pain, nausea, vomiting  GU: no dysuria or gross hematuria  MSK:  No abnormalities  Neuro: no headache, no focal neurologic deficits  Skin: insect bites Psyche: negative.    Allergies  Review of patient's allergies indicates no  known allergies.  Home Medications   Current Outpatient Rx  Name Route Sig Dispense Refill  . AMLODIPINE BESYLATE 5 MG PO TABS Oral Take 1 tablet (5 mg total) by mouth daily. For blood pressure. 30 tablet 0  . ASPIRIN 81 MG PO TBEC Oral Take 1 tablet (81 mg total) by mouth daily. As a blood thinner. 30 tablet 0  . BENZTROPINE MESYLATE 1 MG PO TABS Oral Take 1 tablet (1 mg total) by mouth 2 (two) times daily in the am and at bedtime.. For EPS. 60 tablet 0  . IRBESARTAN 150 MG PO TABS Oral Take 1 tablet (150 mg total) by mouth daily. For blood sugar control. 30 tablet 0  . PANTOPRAZOLE SODIUM 40 MG PO TBEC Oral Take 1 tablet (40 mg total) by mouth 2 (two) times daily before a meal. For reflux. 60 tablet 0  . PERPHENAZINE 8 MG PO TABS Oral Take 1 tablet (8 mg total) by mouth 2 (two) times daily in the am and at bedtime.. For psychosis. 60 tablet 0  . PIOGLITAZONE HCL 15 MG PO TABS Oral Take 1 tablet (15 mg total) by mouth daily. For blood sugar control. 30 tablet 0  . SIMVASTATIN 20 MG PO TABS  Take one tablet every evening for elevated cholesterol. 30 tablet 0    BP 153/88  Pulse 110  Temp 98.3 F (36.8 C) (Oral)  Resp 16  SpO2 99%  Physical Exam  Nursing note and vitals reviewed. Constitutional: She appears well-developed and well-nourished. No distress.  HENT:  Head: Normocephalic and atraumatic.  Eyes: Pupils are equal, round, and reactive to light.  Neck: Normal range of motion. Neck supple.  Cardiovascular: Normal rate and regular rhythm.   Pulmonary/Chest: Effort normal.  Abdominal: Soft.  Neurological: She is alert.  Skin: Skin is warm and dry. Rash noted.       ED Course  Procedures (including critical care time)  Labs Reviewed - No data to display No results found.   1. Bed bug bite       MDM  Most likely bed bugs, Permethrin cream Rx. Information on eradication given.  Pt has been advised of the symptoms that warrant their return to the ED. Patient has  voiced understanding and has agreed to follow-up with the PCP or specialist.         Dorthula Matas, PA 07/26/12 2251

## 2012-07-26 NOTE — ED Notes (Signed)
Per EMS pt transported from home c/o ?bed bug bites, pt c/o itching to arms and abdomen

## 2012-08-02 ENCOUNTER — Emergency Department (HOSPITAL_COMMUNITY)
Admission: EM | Admit: 2012-08-02 | Discharge: 2012-08-03 | Disposition: A | Discharge status: Home / Self Care | Payer: PRIVATE HEALTH INSURANCE | Attending: Emergency Medicine | Admitting: Emergency Medicine

## 2012-08-02 ENCOUNTER — Encounter (HOSPITAL_COMMUNITY): Payer: Self-pay | Admitting: *Deleted

## 2012-08-02 DIAGNOSIS — Z79899 Other long term (current) drug therapy: Secondary | ICD-10-CM | POA: Insufficient documentation

## 2012-08-02 DIAGNOSIS — I10 Essential (primary) hypertension: Secondary | ICD-10-CM | POA: Insufficient documentation

## 2012-08-02 DIAGNOSIS — Z7982 Long term (current) use of aspirin: Secondary | ICD-10-CM | POA: Insufficient documentation

## 2012-08-02 DIAGNOSIS — F209 Schizophrenia, unspecified: Secondary | ICD-10-CM | POA: Insufficient documentation

## 2012-08-02 DIAGNOSIS — F341 Dysthymic disorder: Secondary | ICD-10-CM | POA: Insufficient documentation

## 2012-08-02 DIAGNOSIS — E119 Type 2 diabetes mellitus without complications: Secondary | ICD-10-CM | POA: Insufficient documentation

## 2012-08-02 LAB — RAPID URINE DRUG SCREEN, HOSP PERFORMED
Amphetamines: NOT DETECTED
Barbiturates: NOT DETECTED
Benzodiazepines: NOT DETECTED
Cocaine: NOT DETECTED
Opiates: NOT DETECTED
Tetrahydrocannabinol: NOT DETECTED

## 2012-08-02 LAB — COMPREHENSIVE METABOLIC PANEL
ALT: 15 U/L (ref 0–35)
AST: 19 U/L (ref 0–37)
Albumin: 3.1 g/dL — ABNORMAL LOW (ref 3.5–5.2)
Alkaline Phosphatase: 118 U/L — ABNORMAL HIGH (ref 39–117)
BUN: 7 mg/dL (ref 6–23)
CO2: 25 mEq/L (ref 19–32)
Calcium: 9 mg/dL (ref 8.4–10.5)
Chloride: 102 mEq/L (ref 96–112)
Creatinine, Ser: 0.68 mg/dL (ref 0.50–1.10)
GFR calc Af Amer: >90 mL/min (ref >90)
GFR calc non Af Amer: >90 mL/min (ref >90)
Glucose, Bld: 107 mg/dL — ABNORMAL HIGH (ref 70–99)
Potassium: 3.5 mEq/L (ref 3.5–5.1)
Sodium: 137 mEq/L (ref 135–145)
Total Bilirubin: 0.2 mg/dL — ABNORMAL LOW (ref 0.3–1.2)
Total Protein: 6.7 g/dL (ref 6.0–8.3)

## 2012-08-02 LAB — URINE MICROSCOPIC-ADD ON

## 2012-08-02 LAB — URINALYSIS, ROUTINE W REFLEX MICROSCOPIC
Bilirubin Urine: NEGATIVE
Glucose, UA: NEGATIVE mg/dL
Hgb urine dipstick: NEGATIVE
Ketones, ur: NEGATIVE mg/dL
Nitrite: NEGATIVE
Protein, ur: NEGATIVE mg/dL
Specific Gravity, Urine: 1.009 (ref 1.005–1.030)
Urobilinogen, UA: 1 mg/dL (ref 0.0–1.0)
pH: 6.5 (ref 5.0–8.0)

## 2012-08-02 LAB — CBC WITH DIFFERENTIAL/PLATELET
Basophils Absolute: 0.1 10*3/uL (ref 0.0–0.1)
Basophils Relative: 1 % (ref 0–1)
Eosinophils Absolute: 0.4 10*3/uL (ref 0.0–0.7)
Eosinophils Relative: 7 % — ABNORMAL HIGH (ref 0–5)
HCT: 35.1 % — ABNORMAL LOW (ref 36.0–46.0)
Hemoglobin: 11.5 g/dL — ABNORMAL LOW (ref 12.0–15.0)
Lymphocytes Relative: 31 % (ref 12–46)
Lymphs Abs: 1.7 10*3/uL (ref 0.7–4.0)
MCH: 24.1 pg — ABNORMAL LOW (ref 26.0–34.0)
MCHC: 32.8 g/dL (ref 30.0–36.0)
MCV: 73.6 fL — ABNORMAL LOW (ref 78.0–100.0)
Monocytes Absolute: 0.4 10*3/uL (ref 0.1–1.0)
Monocytes Relative: 7 % (ref 3–12)
Neutro Abs: 3 10*3/uL (ref 1.7–7.7)
Neutrophils Relative %: 54 % (ref 43–77)
Platelets: 216 10*3/uL (ref 150–400)
RBC: 4.77 MIL/uL (ref 3.87–5.11)
RDW: 14.8 % (ref 11.5–15.5)
WBC: 5.6 10*3/uL (ref 4.0–10.5)

## 2012-08-02 LAB — ETHANOL: Alcohol, Ethyl (B): <11 mg/dL (ref 0–11)

## 2012-08-02 MED ORDER — IRBESARTAN 150 MG PO TABS
150.0000 mg | ORAL_TABLET | Freq: Every day | ORAL | Status: DC
  Administered 2012-08-02 – 2012-08-03 (×2): 150 mg via ORAL
  Filled 2012-08-02 (×2): qty 1

## 2012-08-02 MED ORDER — ASPIRIN EC 81 MG PO TBEC
81.0000 mg | DELAYED_RELEASE_TABLET | Freq: Every day | ORAL | Status: DC
  Administered 2012-08-02 – 2012-08-03 (×2): 81 mg via ORAL
  Filled 2012-08-02 (×2): qty 1

## 2012-08-02 MED ORDER — ZIPRASIDONE HCL 20 MG PO CAPS: 40.0000 mg | ORAL_CAPSULE | Freq: Two times a day (BID) | ORAL | Status: DC

## 2012-08-02 MED ORDER — PERMETHRIN 5 % EX CREA
TOPICAL_CREAM | Freq: Once | CUTANEOUS | Status: AC
  Administered 2012-08-02: 11:00:00 via TOPICAL
  Filled 2012-08-02: qty 60

## 2012-08-02 MED ORDER — BENZTROPINE MESYLATE 1 MG PO TABS
1.0000 mg | ORAL_TABLET | ORAL | Status: DC
Start: 2012-08-02 — End: 2012-08-03
  Administered 2012-08-03: 1 mg via ORAL
  Filled 2012-08-02: qty 1

## 2012-08-02 MED ORDER — AMLODIPINE BESYLATE 5 MG PO TABS
5.0000 mg | ORAL_TABLET | Freq: Every day | ORAL | Status: DC
  Administered 2012-08-02 – 2012-08-03 (×2): 5 mg via ORAL
  Filled 2012-08-02 (×2): qty 1

## 2012-08-02 MED ORDER — ZIPRASIDONE HCL 20 MG PO CAPS
40.0000 mg | ORAL_CAPSULE | Freq: Two times a day (BID) | ORAL | Status: DC
  Administered 2012-08-03 (×2): 40 mg via ORAL
  Filled 2012-08-02 (×4): qty 2

## 2012-08-02 MED ORDER — LORAZEPAM 1 MG PO TABS
2.0000 mg | ORAL_TABLET | Freq: Four times a day (QID) | ORAL | Status: DC | PRN
  Administered 2012-08-02: 2 mg via ORAL
  Filled 2012-08-02: qty 2

## 2012-08-02 MED ORDER — ZIPRASIDONE HCL 20 MG PO CAPS
40.0000 mg | ORAL_CAPSULE | Freq: Once | ORAL | Status: AC
  Administered 2012-08-02: 40 mg via ORAL
  Filled 2012-08-02: qty 1

## 2012-08-02 MED ORDER — SIMVASTATIN 20 MG PO TABS
20.0000 mg | ORAL_TABLET | Freq: Every day | ORAL | Status: DC
  Administered 2012-08-03: 20 mg via ORAL
  Filled 2012-08-02 (×2): qty 1

## 2012-08-02 MED ORDER — PANTOPRAZOLE SODIUM 40 MG PO TBEC
40.0000 mg | DELAYED_RELEASE_TABLET | Freq: Two times a day (BID) | ORAL | Status: DC
  Administered 2012-08-03 (×2): 40 mg via ORAL
  Filled 2012-08-02 (×2): qty 1

## 2012-08-02 NOTE — ED Notes (Signed)
Pt up to bathroom.

## 2012-08-02 NOTE — ED Notes (Signed)
Per Genevie Cheshire in main lab send UDS requisition down to the lab and they will try to add on to urine they have for pt.
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
## 2012-08-02 NOTE — ED Notes (Signed)
Lab here to draw blood.

## 2012-08-02 NOTE — ED Notes (Signed)
Pt walked down the hall from the bathroom with her blanket wrapped around the front of her only.

## 2012-08-02 NOTE — ED Notes (Signed)
Pt. In new blue scrubs.pt.and belongings searched and wanded by security.
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
## 2012-08-02 NOTE — ED Notes (Signed)
Pt requested toothpaste, toothbrush, wash cloth, and soap.  Provided pt with supplies and pt is currently in bathroom.

## 2012-08-02 NOTE — ED Notes (Signed)
Tech assisting pt putting the cream on her body as she was putting in on too thick and couldn't reach her back.

## 2012-08-02 NOTE — ED Notes (Signed)
Pt actively talking to things that aren't there; responding to internal stimuli or visual hallucinations.

## 2012-08-02 NOTE — ED Notes (Signed)
Pt is refusing to put on cream for bed bug treatment. Spoke to pt about applying the cream to prevent future bites to which is she responded, "that cream ain't no good for me".  She then proceeded to take a blanket into the bathroom and began showering against RN's earlier instructions.  Took pt shower supplies and new scrubs and socks.

## 2012-08-02 NOTE — ED Notes (Signed)
Pt transferred to psyche ED rm 40. Report given to [place], rn. Belongings sealed and stored in locker 40.

## 2012-08-02 NOTE — ED Notes (Addendum)
Patient is asleep, resting comfortably, breathing is regular, chest expansion is symmetrical. Bed is in locked in lowest position. Side rails are up.

## 2012-08-02 NOTE — ED Notes (Signed)
Received report and assumed care of patient. Pt sitting room asking for fixodent for dentures. Also requesting some breakfast. No other concerns at this time.

## 2012-08-02 NOTE — ED Notes (Signed)
SOC MD called to get info on pt

## 2012-08-02 NOTE — ED Notes (Signed)
Pt has gone into room 38 a couple of times, this last time she was hiding behind the door. When she saw writer she starting laughing and said when are they going to give me my medicine, that's what I need and how did you know I was in here? Explained to pt that there are cameras and to please stay in her room.

## 2012-08-02 NOTE — ED Notes (Signed)
RN,Shell made aware of elevated blood pressure.

## 2012-08-02 NOTE — ED Notes (Signed)
Pt currently putting the medicated cream on herself.

## 2012-08-02 NOTE — ED Provider Notes (Signed)
History     CSN: [id number]  Arrival date & time 08/02/12  0457   First MD Initiated Contact with Patient 08/02/12 0507      Chief Complaint  Patient presents with  . Hallucinations  . Medical Clearance    (Consider location/radiation/quality/duration/timing/severity/associated sxs/prior treatment) HPI Comments: [name] presents for evaluation.  She states she is hearing voices telling her to harm people.  She also states that she is not taking any of her medications because they make her sick.  She stats she is having worsening hallucinations when she takes the blood pressure, diabetes, and cholesterol drugs.  The history is provided by the patient. No language interpreter was used.    Past Medical History  Diagnosis Date  . Hypertension   . Diabetes mellitus   . Hyperlipidemia   . Depression   . Anxiety   . Schizophrenia     Past Surgical History  Procedure Date  . Breast surgery     Family History  Problem Relation Age of Onset  . Kidney disease Mother     History  Substance Use Topics  . Smoking status: Former Smoker -- 1.0 packs/day for 40 years    Types: Cigarettes  . Smokeless tobacco: Never Used  . Alcohol Use: No    OB History    Grav Para Term Preterm Abortions TAB SAB Ect Mult Living   0 0 0 0 0 0 0 0 0 0       Review of Systems  Unable to perform ROS: Psychiatric disorder    Allergies  Review of patient's allergies indicates no known allergies.  Home Medications   Current Outpatient Rx  Name Route Sig Dispense Refill  . AMLODIPINE BESYLATE 5 MG PO TABS Oral Take 1 tablet (5 mg total) by mouth daily. For blood pressure. 30 tablet 0  . ASPIRIN 81 MG PO TBEC Oral Take 1 tablet (81 mg total) by mouth daily. As a blood thinner. 30 tablet 0  . BENZTROPINE MESYLATE 1 MG PO TABS Oral Take 1 tablet (1 mg total) by mouth 2 (two) times daily in the am and at bedtime.. For EPS. 60 tablet 0  . IRBESARTAN 150 MG PO TABS Oral Take 1 tablet (150 mg  total) by mouth daily. For blood sugar control. 30 tablet 0  . PANTOPRAZOLE SODIUM 40 MG PO TBEC Oral Take 1 tablet (40 mg total) by mouth 2 (two) times daily before a meal. For reflux. 60 tablet 0  . PERMETHRIN 5 % EX CREA  Apply to affected area once 60 g 0  . SIMVASTATIN 20 MG PO TABS  Take one tablet every evening for elevated cholesterol. 30 tablet 0    BP 181/94  Pulse 87  Temp 98.5 F (36.9 C) (Oral)  Resp 16  Physical Exam  Nursing note and vitals reviewed. Constitutional: She appears well-developed and well-nourished. No distress.  HENT:  Head: Normocephalic and atraumatic.  Nose: Nose normal.  Mouth/Throat: Oropharynx is clear and moist. No oropharyngeal exudate.       Poor dentition  Eyes: Conjunctivae normal are normal. Pupils are equal, round, and reactive to light. Right eye exhibits no discharge. Left eye exhibits no discharge. No scleral icterus.  Neck: Normal range of motion. Neck supple. No JVD present. No tracheal deviation present.  Cardiovascular: Normal rate, regular rhythm, normal heart sounds, intact distal pulses and normal pulses.   Pulmonary/Chest: Effort normal and breath sounds normal. No stridor. No respiratory distress. She has no wheezes. She  has no rales.  Abdominal: Soft. Bowel sounds are normal. She exhibits no distension and no mass. There is no tenderness. There is no rebound and no guarding.  Musculoskeletal: Normal range of motion. She exhibits no edema and no tenderness.  Skin: Skin is warm and dry. No rash noted. She is not diaphoretic. No pallor.  Psychiatric: Her mood appears not anxious. Her affect is labile. Her affect is not angry, not blunt and not inappropriate. Her speech is rapid and/or pressured. Her speech is not delayed, not tangential and not slurred. She is is hyperactive. She is not agitated, not aggressive, not slowed, not withdrawn, not actively hallucinating and not combative. Thought content is paranoid and delusional. Cognition  and memory are impaired. She expresses inappropriate judgment. She does not exhibit a depressed mood. She expresses no suicidal ideation. She is communicative. She is attentive.    ED Course  Procedures (including critical care time)  Labs Reviewed  URINALYSIS, ROUTINE W REFLEX MICROSCOPIC - Abnormal; Notable for the following:    APPearance CLOUDY (*)     Leukocytes, UA TRACE (*)     All other components within normal limits  URINE MICROSCOPIC-ADD ON - Abnormal; Notable for the following:    Squamous Epithelial / LPF MANY (*)     Bacteria, UA FEW (*)     All other components within normal limits   No results found.   No diagnosis found.    MDM  Pt presents states she is hearing voices.  She states they are telling her to harm others.  She states she has not been taking any of her medications because they make her hallucinate.  This includes her DM,  Htn, and cholesterol meds.  Plan initiate a routine psych screening.  She is currently cooperative.  She is reported to have been in the ER very recently for similar issues.   Will review that evaluation also.  Will consult the ACT Team to assist in her work-up. 0700.  Pt signed over to the oncoming provider pending completion of her evaluation.      Tobin Chad, MD 08/02/12 [phone]

## 2012-08-02 NOTE — ED Notes (Signed)
telepsych in progress 

## 2012-08-02 NOTE — ED Notes (Signed)
Pt states she is hearing voices that tell her to harm people, per EMS  Pt has numerous amount of bed bugs

## 2012-08-02 NOTE — ED Provider Notes (Signed)
The patient is ambulating around the emergency department this morning in no acute distress. She is awaiting psychiatric placement  Celene Kras, MD 08/02/12 [phone]

## 2012-08-02 NOTE — ED Notes (Signed)
Pt has been brushing her teeth for approx. 20 minutes, moving from sink to sink.  Spit in the hall and was redirected to restroom.  Pt walking halls, talking and dancing.

## 2012-08-02 NOTE — ED Notes (Signed)
Patient is  Asleep, resting comfortably.Breathing WNL  

## 2012-08-02 NOTE — ED Notes (Signed)
Pt went in bathroom and got into the shower. She was asked to wait until after the cream was applied and the telepsych completed but she's refusing to do so.

## 2012-08-02 NOTE — ED Notes (Signed)
Patient is asleep, resting comfortably. Breathing WNL.    

## 2012-08-02 NOTE — ED Notes (Signed)
RN requested vitals.  Asked Shannon [name] if we had disposable cuffs due to pt's bedbug situation.  Shannon [name] stated that a cream needed to be applied to pt and that would resolve the matter.  Notified RN.

## 2012-08-02 NOTE — ED Notes (Signed)
Pt up to bathroom. Asked for remote to TV. Redirected back to room but didn't return.

## 2012-08-02 NOTE — BH Assessment (Signed)
Assessment Note   Shannon [name] is an 61 y.o. female who voluntarily presented to [facility] due to c/o psychosis evidenced by auditory and visual hallucinations. During assessment patient reported to assessor that she has been off of her medications for a month to date "It's just too many of them. I get confused and I ain't got nobody to help me." Patient was observed to be oriented x4 and exhibited tangential speech. Patient reported that she commonly sees people walk through walls and that she hears voices, "I see dead people. They call me the devil and they say i'm stupid. They're in the room behind you." Patient's symptoms of psychosis have exacerbated to the point of her having episodes of insomnia and lack of appetite. Patient also reports tactile disturbances, evidenced by pt reporting that she believes her skin is crawling and that sharp pins are sticking her. Patient denies SI and HI & has received prior inpatient treatment at [facility] this year. This clinician recommends further evaluation by [facility] and potential inpatient treatment to alleviate her symptoms of psychosis.   Axis I: Schizophrenia  Axis II: Deferred Axis III:  Past Medical History  Diagnosis Date  . Hypertension   . Diabetes mellitus   . Hyperlipidemia   . Depression   . Anxiety   . Schizophrenia    Axis IV: other psychosocial or environmental problems, problems related to social environment and problems with primary support group Axis V: 31-40 impairment in reality testing  Past Medical History:  Past Medical History  Diagnosis Date  . Hypertension   . Diabetes mellitus   . Hyperlipidemia   . Depression   . Anxiety   . Schizophrenia     Past Surgical History  Procedure Date  . Breast surgery     Family History:  Family History  Problem Relation Age of Onset  . Kidney disease Mother     Social History:  reports that she has quit smoking. Her smoking use included Cigarettes. She  has a 40 pack-year smoking history. She has never used smokeless tobacco. She reports that she does not drink alcohol or use illicit drugs.  Additional Social History:  Alcohol / Drug Use Pain Medications: See MAR Prescriptions: See MAR Over the Counter: See MAR History of alcohol / drug use?: No history of alcohol / drug abuse Longest period of sobriety (when/how long): Unknown  CIWA: CIWA-Ar BP: 181/94 mmHg Pulse Rate: 87  COWS:    Allergies: No Known Allergies  Home Medications:  (Not in a hospital admission)  OB/GYN Status:  No LMP recorded. Patient is postmenopausal.  General Assessment Data Location of Assessment: [facility] ED Living Arrangements: Alone Can pt return to current living arrangement?: Yes Admission Status: Voluntary Is patient capable of signing voluntary admission?: Yes Transfer from: Home Referral Source: Self/Family/Friend  Education Status Is patient currently in school?: No  Risk to self Suicidal Ideation: No Suicidal Intent: No Is patient at risk for suicide?: No Suicidal Plan?: No Access to Means: No What has been your use of drugs/alcohol within the last 12 months?: Pt denies Previous Attempts/Gestures: No How many times?: 0  Other Self Harm Risks: None Reported Triggers for Past Attempts: None known Intentional Self Injurious Behavior: None Family Suicide History: No Recent stressful life event(s): Other (Comment) (Onset of psychosis symptoms) Persecutory voices/beliefs?: No Depression: No Substance abuse history and/or treatment for substance abuse?: No Suicide prevention information given to non-admitted patients: Not applicable  Risk to Others Homicidal Ideation:  No Thoughts of Harm to Others: No Current Homicidal Intent: No Current Homicidal Plan: No Access to Homicidal Means: No Identified Victim: None Report History of harm to others?: No Assessment of Violence: None Noted Violent Behavior Description: Pt is calm and  cooperative Does patient have access to weapons?: No Criminal Charges Pending?: No Does patient have a court date: No  Psychosis Hallucinations: Visual;Auditory (Pt sees people walking through walls and hears voices) Delusions: None noted  Mental Status Report Appear/Hygiene: Disheveled Eye Contact: Fair Motor Activity: Freedom of movement Speech: Slurred;Pressured Level of Consciousness: Quiet/awake Mood: Empty Affect: Appropriate to circumstance Anxiety Level: Minimal Thought Processes: Coherent;Tangential Judgement: Impaired Orientation: Person;Place;Time;Situation Obsessive Compulsive Thoughts/Behaviors: Minimal  Cognitive Functioning Concentration: Decreased Memory: Recent Intact;Remote Intact IQ: Average Insight: Fair Impulse Control: Poor Appetite: Fair Weight Loss: 0  Weight Gain: 0  Sleep: Decreased Total Hours of Sleep: 4  Vegetative Symptoms: None  ADLScreening [facility] Assessment Services) Patient's cognitive ability adequate to safely complete daily activities?: Yes Patient able to express need for assistance with ADLs?: Yes Independently performs ADLs?: Yes (appropriate for developmental age)  Abuse/Neglect [facility]) Physical Abuse: Denies Verbal Abuse: Denies Sexual Abuse: Denies  Prior Inpatient Therapy Prior Inpatient Therapy: Yes Prior Therapy Dates: 2013 Prior Therapy Facilty/Provider(s): [facility] Reason for Treatment: Psychosis  Prior Outpatient Therapy Prior Outpatient Therapy: Yes Prior Therapy Dates: current Prior Therapy Facilty/Provider(s): Pt cannot recall the name. Previous charting states [facility] Reason for Treatment: Med Mgmt  ADL Screening (condition at time of admission) Patient's cognitive ability adequate to safely complete daily activities?: Yes Patient able to express need for assistance with ADLs?: Yes Independently performs ADLs?: Yes (appropriate for developmental age) Weakness of Legs: None Weakness of Arms/Hands:  None  Home Assistive Devices/Equipment Home Assistive Devices/Equipment: None  Therapy Consults (therapy consults require a physician order) PT Evaluation Needed: No OT Evalulation Needed: No SLP Evaluation Needed: No Abuse/Neglect Assessment (Assessment to be complete while patient is alone) Physical Abuse: Denies Verbal Abuse: Denies Sexual Abuse: Denies Exploitation of patient/patient's resources: Denies Self-Neglect: Denies Values / Beliefs Cultural Requests During Hospitalization: None Spiritual Requests During Hospitalization: None Consults Spiritual Care Consult Needed: No Social Work Consult Needed: No      Additional Information 1:1 In Past 12 Months?: No CIRT Risk: No Elopement Risk: No Does patient have medical clearance?: Yes     Disposition: Recommendation for inpatient treatment for c/o psychosis Disposition Disposition of Patient: Referred to;Inpatient treatment program ([facility]) Type of inpatient treatment program: Adult  On Site Evaluation by:   Reviewed with Physician:     Janann Colonel C 08/02/2012 9:58 AM
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
## 2012-08-03 MED ORDER — IBUPROFEN 600 MG PO TABS
600.0000 mg | ORAL_TABLET | Freq: Four times a day (QID) | ORAL | Status: DC | PRN
  Administered 2012-08-03: 600 mg via ORAL
  Filled 2012-08-03: qty 1

## 2012-08-03 NOTE — ED Notes (Signed)
Pt has been up to the nursing station, she requested coffee, request granted and then she came back to ask again what time breakfast would be served and if it was too early for phone calls. Pt redirected back to her room because we were changing shifts and giving report.

## 2012-08-03 NOTE — ED Provider Notes (Signed)
Cleared for discharge by tele-psychiatry and patient agrees.  Hurman Horn, MD 08/04/12 [phone]

## 2012-08-03 NOTE — ED Notes (Addendum)
Patient is asleep, resting comfortably. Side rail was raised to prevent pt falling off stretcher. Breathing WNL 

## 2012-08-03 NOTE — ED Notes (Signed)
Went over discharge instructions with pt. Advised to call her psychiatric provider tomorrow and tell them she needs an appointment ASAP as she was just discharged from the hospital. Read her medication bottles carefully and get a neighbor to help her read the bottles if needed. Also to take her medications and discharge papers to her appointment with her psychiatrist.

## 2012-08-03 NOTE — Progress Notes (Signed)
Patient accepted by Verne Spurr, PA, to [facility] pending an available bed. Rosey Bath, RN

## 2012-08-03 NOTE — ED Notes (Signed)
Pt up to shower

## 2012-08-03 NOTE — ED Notes (Signed)
Patient is asleep, resting comfortably

## 2012-08-03 NOTE — ED Notes (Signed)
Pt said she's fine as long as she takes her medications but she's got two doctors prescribing medications and it gets confusing at times. Suggested taking her medications to each appointment and showing them to both doctors in the future. Pt inquired about going home and was reminded of talking to the doctor on the monitor yesterday. She said oh yea he said he wanted me to be in the hospital, would it be here? Explained to pt that it would be at a different hospital such as [facility] but not sure exactly which hospital. Pt said she doesn't mind going to the hospital to get her medications straightened out she just doesn't want to stay too long.

## 2012-08-03 NOTE — ED Notes (Signed)
Pt was up at the nursing station, talked to Dr Freida Busman about going home and he said it will be awhile.

## 2012-08-03 NOTE — ED Notes (Signed)
Linen changed, pt wet bed in her sleep. New pair of pants were given. Pt did pericare in bathroom. She is eating her dinner at bedside.

## 2012-08-03 NOTE — ED Notes (Signed)
Pt given items to take a shower, pt now in the bathroom to shower.

## 2012-08-03 NOTE — ED Notes (Signed)
Pt up to bathroom.

## 2012-08-03 NOTE — ED Notes (Signed)
Pt is up to bathroom.

## 2012-08-03 NOTE — ED Provider Notes (Signed)
Patient with stable vital signs and labs are stable as well. Patient will have repeat psychiatry consultation today and disposition is pending  Toy Baker, MD 08/03/12 1002

## 2012-08-03 NOTE — ED Notes (Signed)
telepsych info faxed/called into SOC 

## 2012-08-03 NOTE — ED Notes (Signed)
Pt laying on her bed watching TV

## 2012-08-03 NOTE — ED Notes (Signed)
Patient is resting comfortably. Bed locked in lowest position. Breathing WNL. Pt is asleep.

## 2012-08-03 NOTE — ED Notes (Signed)
Pt is laying down in her bed

## 2012-08-06 ENCOUNTER — Other Ambulatory Visit: Payer: Self-pay | Admitting: Obstetrics & Gynecology

## 2012-08-07 ENCOUNTER — Emergency Department (HOSPITAL_COMMUNITY)
Admission: EM | Admit: 2012-08-07 | Discharge: 2012-08-09 | Disposition: A | Discharge status: Home / Self Care | Payer: PRIVATE HEALTH INSURANCE | Attending: Emergency Medicine | Admitting: Emergency Medicine

## 2012-08-07 ENCOUNTER — Other Ambulatory Visit: Payer: Self-pay | Admitting: Obstetrics & Gynecology

## 2012-08-07 ENCOUNTER — Encounter (HOSPITAL_COMMUNITY): Payer: Self-pay | Admitting: Family Medicine

## 2012-08-07 DIAGNOSIS — R443 Hallucinations, unspecified: Secondary | ICD-10-CM

## 2012-08-07 DIAGNOSIS — E119 Type 2 diabetes mellitus without complications: Secondary | ICD-10-CM | POA: Insufficient documentation

## 2012-08-07 DIAGNOSIS — F411 Generalized anxiety disorder: Secondary | ICD-10-CM | POA: Insufficient documentation

## 2012-08-07 DIAGNOSIS — I10 Essential (primary) hypertension: Secondary | ICD-10-CM | POA: Insufficient documentation

## 2012-08-07 DIAGNOSIS — B86 Scabies: Secondary | ICD-10-CM | POA: Insufficient documentation

## 2012-08-07 DIAGNOSIS — N390 Urinary tract infection, site not specified: Secondary | ICD-10-CM | POA: Insufficient documentation

## 2012-08-07 DIAGNOSIS — F209 Schizophrenia, unspecified: Secondary | ICD-10-CM | POA: Insufficient documentation

## 2012-08-07 LAB — URINALYSIS, ROUTINE W REFLEX MICROSCOPIC
Bilirubin Urine: NEGATIVE
Glucose, UA: NEGATIVE mg/dL
Ketones, ur: NEGATIVE mg/dL
Nitrite: POSITIVE — AB
Protein, ur: 30 mg/dL — AB
Specific Gravity, Urine: 1.021 (ref 1.005–1.030)
Urobilinogen, UA: 2 mg/dL — ABNORMAL HIGH (ref 0.0–1.0)
pH: 6 (ref 5.0–8.0)

## 2012-08-07 LAB — URINE MICROSCOPIC-ADD ON

## 2012-08-07 LAB — CBC WITH DIFFERENTIAL/PLATELET
Basophils Absolute: 0.1 10*3/uL (ref 0.0–0.1)
Basophils Relative: 1 % (ref 0–1)
Eosinophils Absolute: 0.2 10*3/uL (ref 0.0–0.7)
Eosinophils Relative: 4 % (ref 0–5)
HCT: 39.7 % (ref 36.0–46.0)
Hemoglobin: 12.8 g/dL (ref 12.0–15.0)
Lymphocytes Relative: 38 % (ref 12–46)
Lymphs Abs: 2.4 10*3/uL (ref 0.7–4.0)
MCH: 23.9 pg — ABNORMAL LOW (ref 26.0–34.0)
MCHC: 32.2 g/dL (ref 30.0–36.0)
MCV: 74.2 fL — ABNORMAL LOW (ref 78.0–100.0)
Monocytes Absolute: 0.4 10*3/uL (ref 0.1–1.0)
Monocytes Relative: 7 % (ref 3–12)
Neutro Abs: 3.1 10*3/uL (ref 1.7–7.7)
Neutrophils Relative %: 50 % (ref 43–77)
Platelets: 218 10*3/uL (ref 150–400)
RBC: 5.35 MIL/uL — ABNORMAL HIGH (ref 3.87–5.11)
RDW: 15 % (ref 11.5–15.5)
WBC: 6.2 10*3/uL (ref 4.0–10.5)

## 2012-08-07 LAB — COMPREHENSIVE METABOLIC PANEL
ALT: 12 U/L (ref 0–35)
AST: 17 U/L (ref 0–37)
Albumin: 3.5 g/dL (ref 3.5–5.2)
Alkaline Phosphatase: 133 U/L — ABNORMAL HIGH (ref 39–117)
BUN: 9 mg/dL (ref 6–23)
CO2: 25 mEq/L (ref 19–32)
Calcium: 9.3 mg/dL (ref 8.4–10.5)
Chloride: 103 mEq/L (ref 96–112)
Creatinine, Ser: 0.7 mg/dL (ref 0.50–1.10)
GFR calc Af Amer: >90 mL/min (ref >90)
GFR calc non Af Amer: >90 mL/min (ref >90)
Glucose, Bld: 91 mg/dL (ref 70–99)
Potassium: 3.8 mEq/L (ref 3.5–5.1)
Sodium: 137 mEq/L (ref 135–145)
Total Bilirubin: 0.3 mg/dL (ref 0.3–1.2)
Total Protein: 7.3 g/dL (ref 6.0–8.3)

## 2012-08-07 LAB — RAPID URINE DRUG SCREEN, HOSP PERFORMED
Amphetamines: NOT DETECTED
Barbiturates: NOT DETECTED
Benzodiazepines: NOT DETECTED
Cocaine: NOT DETECTED
Opiates: NOT DETECTED
Tetrahydrocannabinol: NOT DETECTED

## 2012-08-07 LAB — ETHANOL: Alcohol, Ethyl (B): <11 mg/dL (ref 0–11)

## 2012-08-07 LAB — GLUCOSE, CAPILLARY: Glucose-Capillary: 134 mg/dL — ABNORMAL HIGH (ref 70–99)

## 2012-08-07 MED ORDER — PERMETHRIN 5 % EX CREA
1.0000 "application " | TOPICAL_CREAM | Freq: Once | CUTANEOUS | Status: AC
  Administered 2012-08-07: 1 via TOPICAL
  Filled 2012-08-07: qty 60

## 2012-08-07 MED ORDER — ACETAMINOPHEN 325 MG PO TABS
650.0000 mg | ORAL_TABLET | ORAL | Status: DC | PRN
  Administered 2012-08-07: 650 mg via ORAL
  Filled 2012-08-07: qty 2

## 2012-08-07 MED ORDER — LIDOCAINE HCL (PF) 1 % IJ SOLN
INTRAMUSCULAR | Status: AC
  Administered 2012-08-07: 5 mL via INTRAMUSCULAR
  Filled 2012-08-07: qty 5

## 2012-08-07 MED ORDER — PANTOPRAZOLE SODIUM 40 MG PO TBEC
40.0000 mg | DELAYED_RELEASE_TABLET | Freq: Two times a day (BID) | ORAL | Status: DC
Start: 2012-08-08 — End: 2012-08-09
  Administered 2012-08-08 – 2012-08-09 (×4): 40 mg via ORAL
  Filled 2012-08-07 (×4): qty 1

## 2012-08-07 MED ORDER — CEFIXIME 400 MG PO TABS
400.0000 mg | ORAL_TABLET | Freq: Every day | ORAL | Status: DC
  Administered 2012-08-08 – 2012-08-09 (×2): 400 mg via ORAL
  Filled 2012-08-07 (×2): qty 1

## 2012-08-07 MED ORDER — BENZTROPINE MESYLATE 1 MG PO TABS
1.0000 mg | ORAL_TABLET | ORAL | Status: DC
  Administered 2012-08-07 – 2012-08-09 (×4): 1 mg via ORAL
  Filled 2012-08-07 (×4): qty 1

## 2012-08-07 MED ORDER — IRBESARTAN 75 MG PO TABS
75.0000 mg | ORAL_TABLET | Freq: Every day | ORAL | Status: DC
  Administered 2012-08-07 – 2012-08-09 (×3): 75 mg via ORAL
  Filled 2012-08-07 (×4): qty 1

## 2012-08-07 MED ORDER — DIPHENHYDRAMINE HCL 25 MG PO CAPS
25.0000 mg | ORAL_CAPSULE | Freq: Once | ORAL | Status: AC
  Administered 2012-08-07: 25 mg via ORAL
  Filled 2012-08-07: qty 1

## 2012-08-07 MED ORDER — INSULIN ASPART 100 UNIT/ML ~~LOC~~ SOLN: 0.0000 [IU] | Freq: Every day | SUBCUTANEOUS | Status: DC

## 2012-08-07 MED ORDER — AMLODIPINE BESYLATE 5 MG PO TABS
5.0000 mg | ORAL_TABLET | Freq: Every day | ORAL | Status: DC
  Administered 2012-08-07 – 2012-08-09 (×3): 5 mg via ORAL
  Filled 2012-08-07 (×4): qty 1

## 2012-08-07 MED ORDER — LORAZEPAM 1 MG PO TABS
1.0000 mg | ORAL_TABLET | Freq: Three times a day (TID) | ORAL | Status: DC | PRN
  Administered 2012-08-07 – 2012-08-08 (×2): 1 mg via ORAL
  Filled 2012-08-07: qty 1
  Filled 2012-08-07: qty 2

## 2012-08-07 MED ORDER — SIMVASTATIN 20 MG PO TABS
20.0000 mg | ORAL_TABLET | Freq: Every day | ORAL | Status: DC
  Administered 2012-08-07 – 2012-08-09 (×3): 20 mg via ORAL
  Filled 2012-08-07 (×4): qty 1

## 2012-08-07 MED ORDER — IBUPROFEN 200 MG PO TABS: 600.0000 mg | ORAL_TABLET | Freq: Three times a day (TID) | ORAL | Status: DC | PRN

## 2012-08-07 MED ORDER — CEFTRIAXONE SODIUM 1 G IJ SOLR
1.0000 g | Freq: Once | INTRAMUSCULAR | Status: AC
  Administered 2012-08-07: 1 g via INTRAMUSCULAR
  Filled 2012-08-07: qty 10

## 2012-08-07 MED ORDER — INSULIN ASPART 100 UNIT/ML ~~LOC~~ SOLN: 0.0000 [IU] | Freq: Three times a day (TID) | SUBCUTANEOUS | Status: DC

## 2012-08-07 NOTE — ED Notes (Signed)
Pt reports auditory hallucinations and feels unsafe at home due to them. She denies SI and HI. Vague about what voices are telling her, she states, " i have not been hearing them long" when asked what they say. Pt is drowsy.

## 2012-08-07 NOTE — ED Notes (Signed)
[facility] - CONFERENCE WITH PSYCHIATRIST IN PROGRESS AT PT.'S ROOM .

## 2012-08-07 NOTE — BH Assessment (Signed)
Assessment Note   Shannon [name] is an 61 y.o. female.  Patient was recently at [facility] for similar circumstances of being fearful that people are coming and going from her home.  Patient states that she is hearing voices that tell her "mean things."  She also says that something in her home is "after me."  Patient reports not sleeping well although she was very drowsy during most of the assessment.  She denies any SI or HI at this time.  Patient is a poor historian.  Patient was recently at [facility] on 10/12.  Patient is currenlty unable to make decisions regarding her care.  A telepsych was conducted.  Dr. Jacky Kindle (telepsychiatrist) recommended inpatient psychiatric care.  Referrals made to [facility] and [place]. Axis I: 295.30 Schizophrenia, peranoid type Axis II: Deferred Axis III:  Past Medical History  Diagnosis Date  . Hypertension   . Diabetes mellitus   . Hyperlipidemia   . Depression   . Anxiety   . Schizophrenia    Axis IV: problems related to social environment and problems with primary support group Axis V: 21-30 behavior considerably influenced by delusions or hallucinations OR serious impairment in judgment, communication OR inability to function in almost all areas  Past Medical History:  Past Medical History  Diagnosis Date  . Hypertension   . Diabetes mellitus   . Hyperlipidemia   . Depression   . Anxiety   . Schizophrenia     Past Surgical History  Procedure Date  . Breast surgery     Family History:  Family History  Problem Relation Age of Onset  . Kidney disease Mother     Social History:  reports that she has quit smoking. Her smoking use included Cigarettes. She has a 40 pack-year smoking history. She has never used smokeless tobacco. She reports that she does not drink alcohol or use illicit drugs.  Additional Social History:  Alcohol / Drug Use Pain Medications: See medication reconcilliation Prescriptions: See medication  reconcilliation Over the Counter: See medication reconcilliation History of alcohol / drug use?: No history of alcohol / drug abuse Longest period of sobriety (when/how long): N/A  CIWA: CIWA-Ar BP: 137/85 mmHg Pulse Rate: 92  COWS:    Allergies: No Known Allergies  Home Medications:  (Not in a hospital admission)  OB/GYN Status:  No LMP recorded. Patient is postmenopausal.  General Assessment Data Location of Assessment: [facility] ED Living Arrangements: Alone Can pt return to current living arrangement?: Yes Admission Status: Involuntary Is patient capable of signing voluntary admission?: No (Pt not currently psychiatrically stable) Transfer from: Acute Hospital Referral Source: Self/Family/Friend     Risk to self Suicidal Ideation: No Suicidal Intent: No Is patient at risk for suicide?: No Suicidal Plan?: No Access to Means: No What has been your use of drugs/alcohol within the last 12 months?: Pt denies Previous Attempts/Gestures: No How many times?: 0  Other Self Harm Risks: None reported Triggers for Past Attempts: None known Intentional Self Injurious Behavior: None Family Suicide History: No Recent stressful life event(s):  (Current psychosis) Persecutory voices/beliefs?: Yes Depression: No Depression Symptoms:  (None reported) Substance abuse history and/or treatment for substance abuse?: No Suicide prevention information given to non-admitted patients: Not applicable  Risk to Others Homicidal Ideation: No Thoughts of Harm to Others: No Current Homicidal Intent: No Current Homicidal Plan: No Access to Homicidal Means: No Identified Victim: No one History of harm to others?: No Assessment of Violence: None Noted Violent Behavior Description:  Pt calm and drowsy Does patient have access to weapons?: No Criminal Charges Pending?: No Does patient have a court date: No  Psychosis Hallucinations: Visual;Auditory;With command (Pt sees people in her residence.   Hears voices telling her b) Delusions: None noted  Mental Status Report Appear/Hygiene: Disheveled Eye Contact: Fair Motor Activity: Freedom of movement;Unremarkable Speech: Soft;Slurred;Incoherent Level of Consciousness: Drowsy Mood: Depressed;Helpless Affect: Appropriate to circumstance Anxiety Level: None Thought Processes: Circumstantial;Irrelevant Judgement: Impaired Orientation: Person;Place;Time;Situation Obsessive Compulsive Thoughts/Behaviors: Minimal  Cognitive Functioning Concentration: Decreased Memory: Recent Impaired;Remote Impaired IQ: Average Insight: Poor Impulse Control: Poor Appetite: Fair Weight Loss: 0  Weight Gain: 0  Sleep: Decreased Total Hours of Sleep: 4  Vegetative Symptoms: None  ADLScreening [facility]) Patient's cognitive ability adequate to safely complete daily activities?: Yes Patient able to express need for assistance with ADLs?: Yes Independently performs ADLs?: Yes (appropriate for developmental age)  Abuse/Neglect [facility]) Physical Abuse: Denies Verbal Abuse: Denies Sexual Abuse: Denies  Prior Inpatient Therapy Prior Inpatient Therapy: Yes Prior Therapy Dates: 2013 Prior Therapy Facilty/Provider(s): [facility] Reason for Treatment: Psychosis  Prior Outpatient Therapy Prior Outpatient Therapy: Yes Prior Therapy Dates: current Prior Therapy Facilty/Provider(s): [facility] Reason for Treatment: med  ADL Screening (condition at time of admission) Patient's cognitive ability adequate to safely complete daily activities?: Yes Patient able to express need for assistance with ADLs?: Yes Independently performs ADLs?: Yes (appropriate for developmental age) Weakness of Legs: None Weakness of Arms/Hands: None  Home Assistive Devices/Equipment Home Assistive Devices/Equipment: None    Abuse/Neglect Assessment (Assessment to be complete while patient is alone) Physical Abuse: Denies Verbal Abuse: Denies Sexual  Abuse: Denies Exploitation of patient/patient's resources: Denies Self-Neglect: Denies     Merchant navy officer (For Healthcare) Advance Directive: Patient does not have advance directive;Patient would not like information    Additional Information 1:1 In Past 12 Months?: No CIRT Risk: No Elopement Risk: No Does patient have medical clearance?: Yes     Disposition:  Disposition Disposition of Patient: Referred to;Inpatient treatment program Type of inpatient treatment program: Adult Other disposition(s): Referred to outside facility (Referred to outside faci) Patient referred to:  (Referred to [facility] [place])  On Site Evaluation by:   Reviewed with Physician:  Ivonne Andrew, PA   Beatriz Stallion Ray 08/07/2012 11:48 PM

## 2012-08-07 NOTE — ED Provider Notes (Signed)
History   This chart was scribed for Richardean Canal, MD by Charolett Bumpers . The patient was seen in room TR06C/TR06C. Patient's care was started at 1916.   CSN: [id number] Arrival date & time 08/07/12  Shannon [name]  First MD Initiated Contact with Patient 08/07/12 1916      Chief Complaint  Patient presents with  . Insect Bite    The history is provided by the patient. No language interpreter was used.   Shannon [name] is a 61 y.o. female who presents to the Emergency Department complaining of small lesions to her upper extremities, lower extremities and abdomen. She reports associated itching. She reports that the bug bites have been there for the past 2 weeks. She states that she has applied topical permethrin with no relief. She states she lives alone but has slept in the bed with other family members who are not using Elimite. She also reports a h/o Bipolar disorder, anxiety and schizophrenia. She states that she threw her medications in the garbage yesterday. She reports she has auditory hallucinations telling her to "get away" and "they are going to hurt her". She states that today is the first day without medications. She denies any SI or HI. She was seen at [facility] and d/c 2 days for hallucinations.   Past Medical History  Diagnosis Date  . Hypertension   . Diabetes mellitus   . Hyperlipidemia   . Depression   . Anxiety   . Schizophrenia     Past Surgical History  Procedure Date  . Breast surgery     Family History  Problem Relation Age of Onset  . Kidney disease Mother     History  Substance Use Topics  . Smoking status: Former Smoker -- 1.0 packs/day for 40 years    Types: Cigarettes  . Smokeless tobacco: Never Used  . Alcohol Use: No    OB History    Grav Para Term Preterm Abortions TAB SAB Ect Mult Living   0 0 0 0 0 0 0 0 0 0       Review of Systems  Constitutional: Negative for fever and chills.  Respiratory: Negative for shortness of breath.     Gastrointestinal: Negative for nausea and vomiting.  Skin: Positive for rash.  Neurological: Negative for weakness.  Psychiatric/Behavioral: Positive for hallucinations. The patient is nervous/anxious.   All other systems reviewed and are negative.    Allergies  Review of patient's allergies indicates no known allergies.  Home Medications   Current Outpatient Rx  Name Route Sig Dispense Refill  . AMLODIPINE BESYLATE 5 MG PO TABS Oral Take 5 mg by mouth daily.    . ASPIRIN 81 MG PO TBEC Oral Take 81 mg by mouth daily.    Marland Kitchen BENICAR 20 MG PO TABS Oral Take 20 mg by mouth daily.    Marland Kitchen BENZTROPINE MESYLATE 1 MG PO TABS Oral Take 1 mg by mouth 2 (two) times daily in the am and at bedtime..    . IRBESARTAN 150 MG PO TABS Oral Take 1 tablet (150 mg total) by mouth daily. For blood sugar control. 30 tablet 0  . OMEPRAZOLE 20 MG PO CPDR Oral Take 20 mg by mouth Twice daily.    Marland Kitchen PANTOPRAZOLE SODIUM 40 MG PO TBEC Oral Take 40 mg by mouth 2 (two) times daily before a meal.    . PERMETHRIN 5 % EX CREA Topical Apply 1 application topically once. Apply to affected area once    .  PERPHENAZINE 2 MG PO TABS Oral Take 2 mg by mouth Three times a day.    Marland Kitchen PIOGLITAZONE HCL 15 MG PO TABS Oral Take 15 mg by mouth daily.    Marland Kitchen SIMVASTATIN 20 MG PO TABS Oral Take 20 mg by mouth daily.    . TRAZODONE HCL 50 MG PO TABS Oral Take 50 mg by mouth daily.      BP 137/85  Pulse 92  Temp 98.2 F (36.8 C) (Oral)  Resp 18  SpO2 95%  Physical Exam  Nursing note and vitals reviewed. Constitutional: She is oriented to person, place, and time. She appears well-developed and well-nourished. No distress.       Pt is anxious. NAD.   HENT:  Head: Normocephalic and atraumatic.  Eyes: EOM are normal.  Neck: Neck supple. No tracheal deviation present.  Cardiovascular: Normal rate, regular rhythm and normal heart sounds.   Pulmonary/Chest: Effort normal and breath sounds normal. No respiratory distress. She has no  wheezes.  Musculoskeletal: Normal range of motion.  Neurological: She is alert and oriented to person, place, and time.  Skin: Skin is warm and dry. Rash noted.       Pruritic eruptions with lesions mainly involving upper extremities, web spaces between fingers, torso and ankles bilaterally.   Psychiatric: Her mood appears anxious. She is agitated and actively hallucinating. Thought content is paranoid. She expresses no homicidal and no suicidal ideation.    ED Course  Procedures (including critical care time)  DIAGNOSTIC STUDIES: Oxygen Saturation is 95% on room air, normal by my interpretation.    COORDINATION OF CARE:  19:21-Discussed planned course of treatment with the patient including treatment with benadryl, who is agreeable at this time. Pt is requesting psychriatic help. Will order an ACT consult and Telepsych consult, full psychiatric work up and move to [facility].  19:30-Medication Orders: Diphenhydramine (Benadryl) capsule 25 mg-once    Labs Reviewed  CBC WITH DIFFERENTIAL  COMPREHENSIVE METABOLIC PANEL  URINALYSIS, ROUTINE W REFLEX MICROSCOPIC  URINE RAPID DRUG SCREEN (HOSP PERFORMED)  ETHANOL   No results found.   1. Scabies   2. Hallucinations       MDM  Shannon [name] is a 61 y.o. female here with hallucinations and rash. Rash likely to be secondary to scabies. Will order permethrin cream again. She also has command hallucinations and was cleared by psych 2 days ago. Will re-consult psych as she may benefit from inpatient hospitalization since she has been having more symptoms and not compliant with meds. Will get labs and transfer to behavioral health.    This document was completed by the scribe at my direction and I have reviewed its accuracy. I have personally examined the patient and agrees with the above document.   Chaney Malling, MD       Richardean Canal, MD 08/07/12 [phone]

## 2012-08-07 NOTE — ED Provider Notes (Signed)
Shannon [name] 8:00 PM patient discussed in sign out with Dr. Silverio Lay.  Patient initially presenting with rash, diagnosed with scabies. Also having hallucinations and paranoia. Labs, psych holding orders and tele-psych consultation in place. Plan to review labs as they return and follow tele-psych recommendations.   Labs concerning for UTI. Antibiotics ordered. 7 is aimed 4 mg daily for 7 days.  Dr. Jacky Kindle with psychiatry has conducted an interview with patient and feels patient requires admission to psych unit for paranoid type schizophrenia. Holding orders have been written patient currently well and stable. Patient will be moved to Pod C awaiting placement.    Angus Seller, [place] 08/07/12 2150

## 2012-08-07 NOTE — ED Notes (Signed)
Per pt bug bites all over. sts she also needs a prescription for her nerves.

## 2012-08-07 NOTE — ED Notes (Signed)
Diabetic Diet ordered  

## 2012-08-07 NOTE — ED Notes (Signed)
Patient is resting comfortably. 

## 2012-08-07 NOTE — ED Provider Notes (Signed)
Medical screening examination/treatment/procedure(s) were performed by non-physician practitioner and as supervising physician I was immediately available for consultation/collaboration.  Juliet Rude. Rubin Payor, MD 08/07/12 2356

## 2012-08-08 LAB — GLUCOSE, CAPILLARY
Glucose-Capillary: 81 mg/dL (ref 70–99)
Glucose-Capillary: 103 mg/dL — ABNORMAL HIGH (ref 70–99)
Glucose-Capillary: 95 mg/dL (ref 70–99)

## 2012-08-08 NOTE — ED Notes (Signed)
Dinner tray arrived 

## 2012-08-08 NOTE — ED Notes (Signed)
Myself and Larita Fife, RN changed pt's stretcher linens while pt was showering

## 2012-08-08 NOTE — ED Notes (Signed)
Provided pt with snack and drink. 

## 2012-08-08 NOTE — ED Notes (Signed)
Received bedside report from Imma, RN.  Patient currently asleep in bed; no respiratory or acute distress noted.  Will continue to monitor.

## 2012-08-08 NOTE — ED Notes (Signed)
First meeting with patient. Patient up to restroom and watching tv in her room.

## 2012-08-08 NOTE — ED Notes (Addendum)
Pt given a cup of decaf coffee with splendar and pt also given another pair of footies

## 2012-08-08 NOTE — ED Notes (Signed)
  CBG 103  

## 2012-08-08 NOTE — ED Notes (Signed)
Patient up to the BR without difficulty.

## 2012-08-08 NOTE — ED Notes (Signed)
Pt keeps going back and forth to bathroom, now pt is back in room laying on stretcher watching tv

## 2012-08-08 NOTE — BHH Counselor (Signed)
Spoke with pt's nurse and was given [facility] # to call [phone] concerning the protocol for pt's scabies infection. Peggy from infectious prevention stated that the patient must stay isolated for 24hrs after effective tx, effective tx is indicated by nothing visibly crawling on pt. Counselor reported this information to Tori at [facility] at 16:56. Pt is unable to be transported to [facility] since she has not been isolated for 24hrs and there is no single 400 hall bed available for pt at this time.

## 2012-08-09 LAB — GLUCOSE, CAPILLARY
Glucose-Capillary: 109 mg/dL — ABNORMAL HIGH (ref 70–99)
Glucose-Capillary: 115 mg/dL — ABNORMAL HIGH (ref 70–99)
Glucose-Capillary: 79 mg/dL (ref 70–99)

## 2012-08-09 NOTE — ED Notes (Signed)
Patient discharged with instructions and the teach back method was used. She verbalized an understanding

## 2012-08-09 NOTE — ED Provider Notes (Signed)
Pt alert, content, vitals normal. Nad. Awaiting placement.   Suzi Roots, MD 08/09/12 [phone]

## 2012-08-09 NOTE — ED Notes (Signed)
Patient refused her 12:00 CBG. She states " I only stick my finger once per day in the morning before breakfast. Dr. Denton Lank notified. She would not allow the CBG. Therefore I could not evaluate weather the patient required sliding scale insulin.

## 2012-08-09 NOTE — ED Notes (Signed)
Patient attempting to walk around hallways; patient informed that she needs to stay in her room due to contact precautions. Patient verbalized understanding. Patient given coffee (decaf), upon request.  Patient has no other questions or concerns at this time; will continue to monitor.

## 2012-08-09 NOTE — ED Notes (Signed)
Pt requested and was provided coffee.

## 2012-08-09 NOTE — ED Notes (Signed)
Patient currently lying in bed quietly watching television; patient requesting more toothpaste; toothpaste given.  Patient has no other questions or concerns at this time; will continue to monitor.

## 2012-08-09 NOTE — ED Notes (Signed)
Patient very calm at this time NAD noted

## 2012-08-09 NOTE — ED Notes (Signed)
Patients CBL results were 79. Although two other readings showed up. 109 and 115 I am not sure where these results came from.

## 2012-08-09 NOTE — ED Notes (Signed)
Patient was in a green tank top and her under clothes with blue paper scrubs over top.

## 2012-08-09 NOTE — ED Notes (Addendum)
Patient very upset wanting to leave. Patient cursing and threating this nurse. Patient states "I will come across this desk and Kill you bitch." Patient ran over to the desk and picked up the phone and through it toward me and continued cursing. Security notified and they came to the unit to talk with the patient. Patient continues cursing them as well.

## 2012-08-11 ENCOUNTER — Other Ambulatory Visit: Payer: Self-pay | Admitting: Obstetrics & Gynecology

## 2012-08-13 ENCOUNTER — Encounter (HOSPITAL_COMMUNITY): Payer: Self-pay

## 2012-08-13 ENCOUNTER — Emergency Department (HOSPITAL_COMMUNITY): Payer: PRIVATE HEALTH INSURANCE

## 2012-08-13 ENCOUNTER — Emergency Department (HOSPITAL_COMMUNITY)
Admission: EM | Admit: 2012-08-13 | Discharge: 2012-08-13 | Disposition: A | Discharge status: Home / Self Care | Payer: PRIVATE HEALTH INSURANCE | Attending: Emergency Medicine | Admitting: Emergency Medicine

## 2012-08-13 DIAGNOSIS — I10 Essential (primary) hypertension: Secondary | ICD-10-CM | POA: Insufficient documentation

## 2012-08-13 DIAGNOSIS — E785 Hyperlipidemia, unspecified: Secondary | ICD-10-CM | POA: Insufficient documentation

## 2012-08-13 DIAGNOSIS — R002 Palpitations: Secondary | ICD-10-CM | POA: Insufficient documentation

## 2012-08-13 DIAGNOSIS — E119 Type 2 diabetes mellitus without complications: Secondary | ICD-10-CM | POA: Insufficient documentation

## 2012-08-13 DIAGNOSIS — Z79899 Other long term (current) drug therapy: Secondary | ICD-10-CM | POA: Insufficient documentation

## 2012-08-13 DIAGNOSIS — Z8659 Personal history of other mental and behavioral disorders: Secondary | ICD-10-CM | POA: Insufficient documentation

## 2012-08-13 DIAGNOSIS — Z760 Encounter for issue of repeat prescription: Secondary | ICD-10-CM | POA: Insufficient documentation

## 2012-08-13 DIAGNOSIS — Z87891 Personal history of nicotine dependence: Secondary | ICD-10-CM | POA: Insufficient documentation

## 2012-08-13 LAB — CBC
HCT: 36.8 % (ref 36.0–46.0)
Hemoglobin: 12.3 g/dL (ref 12.0–15.0)
MCH: 24.6 pg — ABNORMAL LOW (ref 26.0–34.0)
MCHC: 33.4 g/dL (ref 30.0–36.0)
MCV: 73.5 fL — ABNORMAL LOW (ref 78.0–100.0)
Platelets: 216 10*3/uL (ref 150–400)
RBC: 5.01 MIL/uL (ref 3.87–5.11)
RDW: 14.5 % (ref 11.5–15.5)
WBC: 6.6 10*3/uL (ref 4.0–10.5)

## 2012-08-13 LAB — BASIC METABOLIC PANEL
BUN: 12 mg/dL (ref 6–23)
CO2: 25 mEq/L (ref 19–32)
Calcium: 9.4 mg/dL (ref 8.4–10.5)
Chloride: 103 mEq/L (ref 96–112)
Creatinine, Ser: 0.69 mg/dL (ref 0.50–1.10)
GFR calc Af Amer: >90 mL/min (ref >90)
GFR calc non Af Amer: >90 mL/min (ref >90)
Glucose, Bld: 97 mg/dL (ref 70–99)
Potassium: 3.6 mEq/L (ref 3.5–5.1)
Sodium: 139 mEq/L (ref 135–145)

## 2012-08-13 LAB — POCT I-STAT TROPONIN I: Troponin i, poc: 0.02 ng/mL (ref 0.00–0.08)

## 2012-08-13 NOTE — ED Notes (Signed)
Per report from Xray, patient refused the Xray and was yelling at staff.  MD, Juleen China, discussed discharge instructions with patient.  Patient refused to sign discharge paperwork and walked out of ED patient care area with security.

## 2012-08-13 NOTE — ED Notes (Addendum)
Pt c/o strength of rx. Pt reports it is Benicar and Norvac.  States "it made my heart beat really fast." Pt reports seen here for the same on Sat. Pt c/o 10/10 pain "to my heart" around 1330, 15 minutes after taking medications. Denies radiation. Denies N/V.

## 2012-08-13 NOTE — ED Notes (Signed)
Patient reports sudden onset of palpitations today after taking her prescribed medications for hypertension and hyperlipidemia.  Patient states, "my heart started beating fast like someone was trying to kill me".  Patient reports 10/10 central chest pain at the time of pain episode. Patient denies shortness of breath.  Patient denies nausea or vomiting. Patient denies any neurological changes.  Patient has slurred speech but reports this to be baseline secondary to false teeth.  Patient is alert and oriented x3.  Patient expresses some paranoia.  Patient states, "I think somebody in my house is trying to do something; I just don't trust that house; I think somebody's been in my house messing with my stuff".  Patient also endorses insect bites on bilateral upper and lower extremities, which appear to be healing.

## 2012-08-13 NOTE — ED Notes (Signed)
Patient transported to X-ray 

## 2012-08-13 NOTE — ED Notes (Signed)
Patient denies chest pain or any other pain at this time.

## 2012-08-17 ENCOUNTER — Emergency Department (HOSPITAL_COMMUNITY)
Admission: EM | Admit: 2012-08-17 | Discharge: 2012-08-18 | Disposition: A | Discharge status: Home / Self Care | Payer: 59 | Attending: Emergency Medicine | Admitting: Emergency Medicine

## 2012-08-17 ENCOUNTER — Encounter (HOSPITAL_COMMUNITY): Payer: Self-pay | Admitting: *Deleted

## 2012-08-17 DIAGNOSIS — Z9119 Patient's noncompliance with other medical treatment and regimen: Secondary | ICD-10-CM

## 2012-08-17 DIAGNOSIS — Z87891 Personal history of nicotine dependence: Secondary | ICD-10-CM | POA: Insufficient documentation

## 2012-08-17 DIAGNOSIS — E785 Hyperlipidemia, unspecified: Secondary | ICD-10-CM | POA: Insufficient documentation

## 2012-08-17 DIAGNOSIS — E119 Type 2 diabetes mellitus without complications: Secondary | ICD-10-CM | POA: Insufficient documentation

## 2012-08-17 DIAGNOSIS — F411 Generalized anxiety disorder: Secondary | ICD-10-CM | POA: Insufficient documentation

## 2012-08-17 DIAGNOSIS — F329 Major depressive disorder, single episode, unspecified: Secondary | ICD-10-CM | POA: Insufficient documentation

## 2012-08-17 DIAGNOSIS — I10 Essential (primary) hypertension: Secondary | ICD-10-CM | POA: Insufficient documentation

## 2012-08-17 DIAGNOSIS — Z91199 Patient's noncompliance with other medical treatment and regimen due to unspecified reason: Secondary | ICD-10-CM | POA: Insufficient documentation

## 2012-08-17 DIAGNOSIS — Z79899 Other long term (current) drug therapy: Secondary | ICD-10-CM | POA: Insufficient documentation

## 2012-08-17 DIAGNOSIS — R443 Hallucinations, unspecified: Secondary | ICD-10-CM | POA: Insufficient documentation

## 2012-08-17 DIAGNOSIS — F200 Paranoid schizophrenia: Secondary | ICD-10-CM

## 2012-08-17 DIAGNOSIS — F3289 Other specified depressive episodes: Secondary | ICD-10-CM | POA: Insufficient documentation

## 2012-08-17 LAB — CBC WITH DIFFERENTIAL/PLATELET
Basophils Absolute: 0 10*3/uL (ref 0.0–0.1)
Basophils Relative: 0 % (ref 0–1)
Eosinophils Absolute: 0.1 10*3/uL (ref 0.0–0.7)
Eosinophils Relative: 2 % (ref 0–5)
HCT: 37.1 % (ref 36.0–46.0)
Hemoglobin: 12.1 g/dL (ref 12.0–15.0)
Lymphocytes Relative: 33 % (ref 12–46)
Lymphs Abs: 1.6 10*3/uL (ref 0.7–4.0)
MCH: 23.8 pg — ABNORMAL LOW (ref 26.0–34.0)
MCHC: 32.6 g/dL (ref 30.0–36.0)
MCV: 73 fL — ABNORMAL LOW (ref 78.0–100.0)
Monocytes Absolute: 0.3 10*3/uL (ref 0.1–1.0)
Monocytes Relative: 7 % (ref 3–12)
Neutro Abs: 2.8 10*3/uL (ref 1.7–7.7)
Neutrophils Relative %: 57 % (ref 43–77)
Platelets: 245 10*3/uL (ref 150–400)
RBC: 5.08 MIL/uL (ref 3.87–5.11)
RDW: 14.5 % (ref 11.5–15.5)
WBC: 4.9 10*3/uL (ref 4.0–10.5)

## 2012-08-17 LAB — RAPID URINE DRUG SCREEN, HOSP PERFORMED
Amphetamines: NOT DETECTED
Barbiturates: NOT DETECTED
Benzodiazepines: NOT DETECTED
Cocaine: NOT DETECTED
Opiates: NOT DETECTED
Tetrahydrocannabinol: NOT DETECTED

## 2012-08-17 LAB — BASIC METABOLIC PANEL
BUN: 7 mg/dL (ref 6–23)
CO2: 26 mEq/L (ref 19–32)
Calcium: 9.3 mg/dL (ref 8.4–10.5)
Chloride: 103 mEq/L (ref 96–112)
Creatinine, Ser: 0.6 mg/dL (ref 0.50–1.10)
GFR calc Af Amer: >90 mL/min (ref >90)
GFR calc non Af Amer: >90 mL/min (ref >90)
Glucose, Bld: 96 mg/dL (ref 70–99)
Potassium: 3.1 mEq/L — ABNORMAL LOW (ref 3.5–5.1)
Sodium: 138 mEq/L (ref 135–145)

## 2012-08-17 LAB — ETHANOL: Alcohol, Ethyl (B): <11 mg/dL (ref 0–11)

## 2012-08-17 MED ORDER — NICOTINE 21 MG/24HR TD PT24
21.0000 mg | MEDICATED_PATCH | Freq: Every day | TRANSDERMAL | Status: DC
  Administered 2012-08-18: 21 mg via TRANSDERMAL
  Filled 2012-08-17 (×2): qty 1

## 2012-08-17 MED ORDER — ZIPRASIDONE HCL 20 MG PO CAPS
20.0000 mg | ORAL_CAPSULE | Freq: Two times a day (BID) | ORAL | Status: DC
  Administered 2012-08-17: 20 mg via ORAL
  Filled 2012-08-17 (×2): qty 1

## 2012-08-17 MED ORDER — ACETAMINOPHEN 325 MG PO TABS: 650.0000 mg | ORAL_TABLET | ORAL | Status: DC | PRN

## 2012-08-17 MED ORDER — BENZTROPINE MESYLATE 1 MG PO TABS
1.0000 mg | ORAL_TABLET | Freq: Two times a day (BID) | ORAL | Status: DC
  Administered 2012-08-17 – 2012-08-18 (×2): 1 mg via ORAL
  Filled 2012-08-17 (×3): qty 1

## 2012-08-17 MED ORDER — TRAZODONE HCL 50 MG PO TABS
50.0000 mg | ORAL_TABLET | Freq: Every day | ORAL | Status: DC
  Administered 2012-08-17: 50 mg via ORAL
  Filled 2012-08-17: qty 1

## 2012-08-17 MED ORDER — PIOGLITAZONE HCL 15 MG PO TABS
15.0000 mg | ORAL_TABLET | Freq: Every day | ORAL | Status: DC
  Administered 2012-08-17 – 2012-08-18 (×2): 15 mg via ORAL
  Filled 2012-08-17 (×2): qty 1

## 2012-08-17 MED ORDER — IRBESARTAN 75 MG PO TABS
75.0000 mg | ORAL_TABLET | Freq: Every day | ORAL | Status: DC
  Administered 2012-08-18: 75 mg via ORAL
  Filled 2012-08-17 (×2): qty 1

## 2012-08-17 MED ORDER — ONDANSETRON HCL 8 MG PO TABS: 4.0000 mg | ORAL_TABLET | Freq: Three times a day (TID) | ORAL | Status: DC | PRN

## 2012-08-17 MED ORDER — ZIPRASIDONE HCL 40 MG PO CAPS
40.0000 mg | ORAL_CAPSULE | Freq: Two times a day (BID) | ORAL | Status: DC
  Administered 2012-08-18 (×2): 40 mg via ORAL
  Filled 2012-08-17 (×3): qty 1

## 2012-08-17 MED ORDER — PANTOPRAZOLE SODIUM 40 MG PO TBEC
40.0000 mg | DELAYED_RELEASE_TABLET | Freq: Every day | ORAL | Status: DC
  Administered 2012-08-18: 40 mg via ORAL
  Filled 2012-08-17 (×2): qty 1

## 2012-08-17 MED ORDER — ZIPRASIDONE MESYLATE 20 MG IM SOLR: 10.0000 mg | Freq: Four times a day (QID) | INTRAMUSCULAR | Status: DC | PRN

## 2012-08-17 MED ORDER — PERPHENAZINE 4 MG PO TABS
6.0000 mg | ORAL_TABLET | Freq: Every day | ORAL | Status: DC
  Administered 2012-08-17: 6 mg via ORAL
  Filled 2012-08-17: qty 1

## 2012-08-17 MED ORDER — IBUPROFEN 200 MG PO TABS
600.0000 mg | ORAL_TABLET | Freq: Three times a day (TID) | ORAL | Status: DC | PRN
  Administered 2012-08-18: 600 mg via ORAL
  Filled 2012-08-17: qty 3

## 2012-08-17 MED ORDER — BENZTROPINE MESYLATE 1 MG/ML IJ SOLN
1.0000 mg | Freq: Two times a day (BID) | INTRAMUSCULAR | Status: DC
  Filled 2012-08-17: qty 1

## 2012-08-17 MED ORDER — POTASSIUM CHLORIDE CRYS ER 20 MEQ PO TBCR
40.0000 meq | EXTENDED_RELEASE_TABLET | Freq: Two times a day (BID) | ORAL | Status: DC
  Administered 2012-08-17 – 2012-08-18 (×3): 40 meq via ORAL
  Filled 2012-08-17 (×3): qty 2

## 2012-08-17 MED ORDER — AMLODIPINE BESYLATE 5 MG PO TABS
5.0000 mg | ORAL_TABLET | Freq: Once | ORAL | Status: AC
  Administered 2012-08-17: 5 mg via ORAL
  Filled 2012-08-17: qty 1

## 2012-08-17 MED ORDER — LORAZEPAM 1 MG PO TABS: 1.0000 mg | ORAL_TABLET | Freq: Four times a day (QID) | ORAL | Status: DC | PRN

## 2012-08-17 MED ORDER — BENZTROPINE MESYLATE 1 MG/ML IJ SOLN
1.0000 mg | Freq: Once | INTRAMUSCULAR | Status: DC
  Filled 2012-08-17: qty 1

## 2012-08-17 NOTE — ED Notes (Signed)
Per Ptar: pt has been off medications for schizophernia. Pt does not know how long she has been off. Pt was trying to kill an imaginary person and was seeing blue spots. Pt admits to also wanting to hurt herself.

## 2012-08-17 NOTE — ED Notes (Signed)
Patient continues to hallucinate and talks to a person that is not ion the room. Patient does not appear to be upset or concerned by the hallucinations. Sitter is at the bedside.

## 2012-08-17 NOTE — ED Notes (Signed)
Patient refused all medications except Norvasc and Actos. Patient states she will wait until she gets to the hospital. Nurse informed patient that she is in the hospital and patient continues to refuse to take any other medications.

## 2012-08-17 NOTE — BHH Counselor (Signed)
IVC completed per Dr. Jacques Navy orders

## 2012-08-17 NOTE — BH Assessment (Signed)
Assessment Note   Shannon [name] is an 61 y.o. female presenting with psychosis and non-compliance with medication.  Pt denies SI/HI at this time.  Pt states she is experiencing AVH and was responding to internal stimuli during the assessment.  Pt was difficult to redirect and struggled to cooperate with the assessor.  Pt was calm.  Pt stated voices are present w/ command unspecified.  Pt denied the desire to hurt herself or anyone else.  Pt admits to not taking her medication appropriately at home.  ED staff stated that the pt has refused her psychotropic medications at present.  Assessor informed the pt she needed to take medication administered in order to make the voices stop.  Pt stated she understood.  Pt endorses "a lot of paranoia right now" but refused to elaborate.  Pt states that two of her brothers "are paranoid schizophrenic and bipolar like me".  Pt clearly stated she wants to go to [facility] and "get right on my medications".  Pt is pending telepsych.  Referrals made to [facility], [facility] and [place].      Axis I: Chronic Paranoid Schizophrenia Axis II: Deferred Axis III:  Past Medical History  Diagnosis Date  . Hypertension   . Diabetes mellitus   . Hyperlipidemia   . Depression   . Anxiety   . Schizophrenia    Axis IV: other psychosocial or environmental problems, problems with access to health care services and medication management Axis V: 21-30 behavior considerably influenced by delusions or hallucinations OR serious impairment in judgment, communication OR inability to function in almost all areas  Past Medical History:  Past Medical History  Diagnosis Date  . Hypertension   . Diabetes mellitus   . Hyperlipidemia   . Depression   . Anxiety   . Schizophrenia     Past Surgical History  Procedure Date  . Breast surgery     Family History:  Family History  Problem Relation Age of Onset  . Kidney disease Mother     Social History:  reports that she has quit smoking. Her  smoking use included Cigarettes. She has a 40 pack-year smoking history. She has never used smokeless tobacco. She reports that she does not drink alcohol or use illicit drugs.  Additional Social History:  Alcohol / Drug Use History of alcohol / drug use?: No history of alcohol / drug abuse  CIWA: CIWA-Ar BP: 114/57 mmHg Pulse Rate: 61  COWS:    Allergies: No Known Allergies  Home Medications:  (Not in a hospital admission)  OB/GYN Status:  No LMP recorded. Patient is postmenopausal.  General Assessment Data Location of Assessment: [facility] ED ACT Assessment: Yes Living Arrangements: Alone Can pt return to current living arrangement?: Yes Admission Status: Voluntary Is patient capable of signing voluntary admission?: Yes Transfer from: Acute Hospital Referral Source: Self/Family/Friend     Risk to self Suicidal Ideation: No Suicidal Intent: No Is patient at risk for suicide?: No Suicidal Plan?: No Access to Means: No What has been your use of drugs/alcohol within the last 12 months?: none Previous Attempts/Gestures: No How many times?: 0  Other Self Harm Risks: none Triggers for Past Attempts: None known Intentional Self Injurious Behavior: None Family Suicide History: No Recent stressful life event(s): Recent negative physical changes (not taking meds) Persecutory voices/beliefs?: Yes Depression: No Depression Symptoms:  (none) Substance abuse history and/or treatment for substance abuse?: No Suicide prevention information given to non-admitted patients: Not applicable  Risk to Others Homicidal Ideation: No  Thoughts of Harm to Others: No Current Homicidal Intent: No Current Homicidal Plan: No Access to Homicidal Means: No Identified Victim: none History of harm to others?: No Assessment of Violence: None Noted Violent Behavior Description: none Does patient have access to weapons?: No Criminal Charges Pending?: No Does patient have a court date:  No  Psychosis Hallucinations: Auditory;With command;Visual (unspecified) Delusions: None noted  Mental Status Report Appear/Hygiene: Disheveled Eye Contact: Fair Motor Activity: Gait exaggerated Speech: Soft;Slurred;Incoherent Level of Consciousness: Drowsy Mood: Preoccupied Affect: Appropriate to circumstance Anxiety Level: None Thought Processes: Circumstantial Judgement: Impaired Orientation: Person;Place;Time;Situation Obsessive Compulsive Thoughts/Behaviors: None  Cognitive Functioning Concentration: Decreased Memory: Recent Impaired;Remote Impaired IQ: Average Insight: Poor Impulse Control: Poor Appetite: Fair Weight Loss: 0  Weight Gain: 0  Sleep: Decreased Total Hours of Sleep: 4  Vegetative Symptoms: None  ADLScreening [facility] Assessment Services) Patient's cognitive ability adequate to safely complete daily activities?: Yes Patient able to express need for assistance with ADLs?: Yes Independently performs ADLs?: Yes (appropriate for developmental age)  Abuse/Neglect [facility]) Physical Abuse: Denies Verbal Abuse: Denies Sexual Abuse: Denies  Prior Inpatient Therapy Prior Inpatient Therapy: Yes Prior Therapy Dates: 2013 Prior Therapy Facilty/Provider(s): [facility] Reason for Treatment: Psychosis  Prior Outpatient Therapy Prior Outpatient Therapy: Yes Prior Therapy Dates: current Prior Therapy Facilty/Provider(s): [facility] Reason for Treatment: med  ADL Screening (condition at time of admission) Patient's cognitive ability adequate to safely complete daily activities?: Yes Patient able to express need for assistance with ADLs?: Yes Independently performs ADLs?: Yes (appropriate for developmental age)       Abuse/Neglect Assessment (Assessment to be complete while patient is alone) Physical Abuse: Denies Verbal Abuse: Denies Sexual Abuse: Denies Exploitation of patient/patient's resources: Denies Self-Neglect: Denies           Additional Information 1:1 In Past 12 Months?: No CIRT Risk: No Elopement Risk: No Does patient have medical clearance?: Yes     Disposition:  Disposition Disposition of Patient: Referred to;Inpatient treatment program Type of inpatient treatment program: Adult Other disposition(s): Referred to outside facility (Referred to outside faci) Patient referred to: Other (Comment) (Referred to [facility] [place])  On Site Evaluation by:   Reviewed with Physician:     Ena Dawley Pate 08/17/2012 5:02 PM

## 2012-08-17 NOTE — ED Notes (Signed)
Received report from Eston Esters  And gave report to [employer]. Patient just moved to room with sitter. Pat aware that pt needs psychiatric assessment.

## 2012-08-17 NOTE — ED Notes (Signed)
Pt showering.  Bed linens changed and room wiped down while patient not in room.  Given clean scrubs post-shower./

## 2012-08-17 NOTE — BHH Counselor (Signed)
Spoke with Ala Dach at [facility] regarding beds and 2 referrals to be run.  No beds are presently available and pt's will be run per usual.

## 2012-08-17 NOTE — ED Provider Notes (Signed)
Medical screening examination/treatment/procedure(s) were performed by non-physician practitioner and as supervising physician I was immediately available for consultation/collaboration.   Brandt Loosen, MD 08/17/12 [id number]

## 2012-08-17 NOTE — ED Provider Notes (Signed)
History     CSN: [id number]  Arrival date & time 08/17/12  6962   First MD Initiated Contact with Patient 08/17/12 [phone]      Chief Complaint  Patient presents with  . Suicidal    (Consider location/radiation/quality/duration/timing/severity/associated sxs/prior treatment) The history is provided by the patient and the EMS personnel. No language interpreter was used.  cc: 61 year-old female coming in by EMS whom she called because there was a man trying to kill her and she was hallucinating. Past medical history of schizophrenia and states she is off her meds. Patient lives in a rooming house and has 4 brothers and 2 sisters who "don't care anything about her". Patient states she's been hallucinating and there is a man his turned to kill her and she is trying to kill him. She insisted on being moved out of her room in the ER because the man was in the room and trying to kill her. Patient also states she has been a bug bites all over her arms. She does smoke her PCP is Dr. Lowell Guitar for [facility]. [facility]. She also has a past medical history of hypertension diabetes hyperlipidemia depression and anxiety. Patient was recently here on 1018 for basically the same paranoid behavior. Denies alcohol or drugs suicidal or homicidal ideations. Patient is a smoker.  Past Medical History  Diagnosis Date  . Hypertension   . Diabetes mellitus   . Hyperlipidemia   . Depression   . Anxiety   . Schizophrenia     Past Surgical History  Procedure Date  . Breast surgery     Family History  Problem Relation Age of Onset  . Kidney disease Mother     History  Substance Use Topics  . Smoking status: Former Smoker -- 1.0 packs/day for 40 years    Types: Cigarettes  . Smokeless tobacco: Never Used  . Alcohol Use: No    OB History    Grav Para Term Preterm Abortions TAB SAB Ect Mult Living   0 0 0 0 0 0 0 0 0 0       Review of Systems    Constitutional: Negative.   HENT: Negative.   Eyes: Negative.   Respiratory: Negative.   Cardiovascular: Negative.   Gastrointestinal: Negative.   Skin:       Lesions to UE  Neurological: Negative.   Psychiatric/Behavioral: Negative.   All other systems reviewed and are negative.    Allergies  Review of patient's allergies indicates no known allergies.  Home Medications   Current Outpatient Rx  Name Route Sig Dispense Refill  . AMLODIPINE BESYLATE 5 MG PO TABS Oral Take 5 mg by mouth daily.    . ASPIRIN 81 MG PO TBEC Oral Take 81 mg by mouth daily.    Marland Kitchen BENICAR 20 MG PO TABS Oral Take 20 mg by mouth daily.    Marland Kitchen BENZTROPINE MESYLATE 1 MG PO TABS Oral Take 1 mg by mouth 2 (two) times daily in the am and at bedtime..    . IRBESARTAN 150 MG PO TABS Oral Take 1 tablet (150 mg total) by mouth daily. For blood sugar control. 30 tablet 0  . OMEPRAZOLE 20 MG PO CPDR Oral Take 20 mg by mouth Twice daily.    Marland Kitchen PERPHENAZINE 2 MG PO TABS Oral Take 2 mg by mouth Three times a day.    Marland Kitchen PIOGLITAZONE HCL 15 MG PO TABS Oral  Take 15 mg by mouth daily.    Marland Kitchen SIMVASTATIN 20 MG PO TABS Oral Take 20 mg by mouth daily.    . TRAZODONE HCL 50 MG PO TABS Oral Take 50 mg by mouth daily.      BP 137/65  Pulse 87  Temp 98.5 F (36.9 C) (Oral)  Resp 22  SpO2 100%  Physical Exam  Nursing note and vitals reviewed. Constitutional: She is oriented to person, place, and time. She appears well-developed and well-nourished.  HENT:  Head: Normocephalic and atraumatic.  Eyes: Conjunctivae normal and EOM are normal. Pupils are equal, round, and reactive to light.  Neck: Normal range of motion. Neck supple.  Cardiovascular: Normal rate.   Pulmonary/Chest: Effort normal.  Abdominal: Soft.  Musculoskeletal: Normal range of motion. She exhibits no edema and no tenderness.  Neurological: She is alert and oriented to person, place, and time. She has normal reflexes.  Skin: Skin is warm and dry.  Psychiatric:  Her speech is normal. Her mood appears anxious. She is agitated and actively hallucinating. Thought content is paranoid. She expresses impulsivity. She expresses no homicidal and no suicidal ideation. She expresses no suicidal plans and no homicidal plans.    ED Course  Procedures (including critical care time)  Labs Reviewed - No data to display No results found.   No diagnosis found.    MDM   61 year old schizophrenic patient hearing voices and hallucinating. Refusing meds and off of her meds. Waiting for a tele-psych. Holding orders in. Meds her in. Patient was here a few days ago for the same. Denies suicidal or homicidal ideations       Remi Haggard, NP 08/17/12 1536

## 2012-08-17 NOTE — ED Notes (Signed)
Pt states she does not want to go back in her room as long as that man is in there, however upon looking in the room no one appears to be in there.

## 2012-08-17 NOTE — ED Notes (Signed)
Pt wanded by security. 

## 2012-08-17 NOTE — ED Notes (Signed)
AC notified of need for sitter.  

## 2012-08-17 NOTE — Progress Notes (Signed)
7:25 PM Telepsychiatry consult recommended inpatient psychiatric treatment. Therefore involuntary commitment papers were completed and submitted.  The telepsychiatry consultant recommended Geodon 40 mg bid, cogentin 1 mg bid, Ativan.2 mg q6h prn agitation and Geodon 10 mg IM q6h prn severe agitation).  Maximum dose of IM Geodon not to exceed 40 mg per 24 hours.

## 2012-08-18 ENCOUNTER — Emergency Department (HOSPITAL_COMMUNITY): Payer: 59

## 2012-08-18 LAB — URINALYSIS, ROUTINE W REFLEX MICROSCOPIC
Bilirubin Urine: NEGATIVE
Glucose, UA: NEGATIVE mg/dL
Hgb urine dipstick: NEGATIVE
Ketones, ur: NEGATIVE mg/dL
Leukocytes, UA: NEGATIVE
Nitrite: NEGATIVE
Protein, ur: NEGATIVE mg/dL
Specific Gravity, Urine: 1.008 (ref 1.005–1.030)
Urobilinogen, UA: 0.2 mg/dL (ref 0.0–1.0)
pH: 6 (ref 5.0–8.0)

## 2012-08-18 MED ORDER — HYDROXYZINE HCL 25 MG PO TABS
25.0000 mg | ORAL_TABLET | Freq: Four times a day (QID) | ORAL | Status: DC | PRN
  Administered 2012-08-18: 25 mg via ORAL
  Filled 2012-08-18: qty 1

## 2012-08-18 NOTE — ED Provider Notes (Signed)
Pt was accepted at [facility] by dr. Yetta Barre.  Cheri Guppy, MD 08/18/12 1859

## 2012-08-18 NOTE — ED Notes (Signed)
CBG of 93 completed.

## 2012-08-18 NOTE — ED Notes (Signed)
Patient understands discharge instructions.  Patient is alert and orientedx4.   No questions.

## 2012-08-18 NOTE — BH Assessment (Signed)
Assessment Note   Shannon [name] is an 61 y.o. female that was reassessed this day.  Pt currently awake, focused on her skin itching and had circumstantial and rapid speech.  Pt was pleasant and cooperative, watchng children's cartoons.  Pt made IVC and inpatient recommended per telepsych.  Pt appeared to be responding to internal stimuli and presented with AVH, although not specific.  Pt denies SI or HI.  Pt denies SA.  Pt stated she needed to go to [facility].  Pt admitted she has not been taking her medication as prescribed at home.  Pt's medications also adjusted per telepsych.  Pt is pending BHH.  This clinician also continuing bed finding elsewhere.  Completed reassessment, assessment notification and faxed to [facility].  Per [facility], pt being ran by doctor there.  Updated ED staff.  Previous Notes:  Shannon [name] is an 61 y.o. female presenting with psychosis and non-compliance with medication. Pt denies SI/HI at this time. Pt states she is experiencing AVH and was responding to internal stimuli during the assessment. Pt was difficult to redirect and struggled to cooperate with the assessor. Pt was calm. Pt stated voices are present w/ command unspecified. Pt denied the desire to hurt herself or anyone else. Pt admits to not taking her medication appropriately at home. ED staff stated that the pt has refused her psychotropic medications at present. Assessor informed the pt she needed to take medication administered in order to make the voices stop. Pt stated she understood. Pt endorses "a lot of paranoia right now" but refused to elaborate. Pt states that two of her brothers "are paranoid schizophrenic and bipolar like me". Pt clearly stated she wants to go to [facility] and "get right on my medications". Pt is pending telepsych. Referrals made to [facility], [facility] and [place].    Axis I: Schizophrenia, Paranoid Type (Chronic) Axis II: Deferred Axis III:  Past Medical History  Diagnosis Date  . Hypertension   . Diabetes  mellitus   . Hyperlipidemia   . Depression   . Anxiety   . Schizophrenia    Axis IV: other psychosocial or environmental problems, problems with access to health care services and noncompliance with medications Axis V: 21-30 behavior considerably influenced by delusions or hallucinations OR serious impairment in judgment, communication OR inability to function in almost all areas  Past Medical History:  Past Medical History  Diagnosis Date  . Hypertension   . Diabetes mellitus   . Hyperlipidemia   . Depression   . Anxiety   . Schizophrenia     Past Surgical History  Procedure Date  . Breast surgery     Family History:  Family History  Problem Relation Age of Onset  . Kidney disease Mother     Social History:  reports that she has quit smoking. Her smoking use included Cigarettes. She has a 40 pack-year smoking history. She has never used smokeless tobacco. She reports that she does not drink alcohol or use illicit drugs.  Additional Social History:  Alcohol / Drug Use Pain Medications: see MAR Prescriptions: see MAR Over the Counter: see MAR History of alcohol / drug use?: No history of alcohol / drug abuse Longest period of sobriety (when/how long): na Negative Consequences of Use:  (na) Withdrawal Symptoms:  (na)  CIWA: CIWA-Ar BP: 123/61 mmHg Pulse Rate: 66  COWS:    Allergies: No Known Allergies  Home Medications:  (Not in a hospital admission)  OB/GYN Status:  No LMP recorded. Patient is  postmenopausal.  General Assessment Data Location of Assessment: [facility] ED ACT Assessment: Yes Living Arrangements: Alone Can pt return to current living arrangement?: Yes Admission Status: Voluntary Is patient capable of signing voluntary admission?: Yes Transfer from: Acute Hospital Referral Source: Self/Family/Friend  Education Status Is patient currently in school?: No  Risk to self Suicidal Ideation: No Suicidal Intent: No Is patient at risk for suicide?:  No Suicidal Plan?: No Access to Means: No What has been your use of drugs/alcohol within the last 12 months?: na Previous Attempts/Gestures: No How many times?: 0  Other Self Harm Risks: pt denies Triggers for Past Attempts: None known Intentional Self Injurious Behavior: None Family Suicide History: No Recent stressful life event(s): Other (Comment) (not taking meds, psychotic) Persecutory voices/beliefs?: Yes Depression: No Depression Symptoms:  (none, pt denies) Substance abuse history and/or treatment for substance abuse?: No Suicide prevention information given to non-admitted patients: Not applicable  Risk to Others Homicidal Ideation: No Thoughts of Harm to Others: No Current Homicidal Intent: No Current Homicidal Plan: No Access to Homicidal Means: No Identified Victim: none per pt History of harm to others?: No Assessment of Violence: None Noted Violent Behavior Description: na - pt calm, cooperative Does patient have access to weapons?: No Criminal Charges Pending?: No Does patient have a court date: No  Psychosis Hallucinations: Auditory;With command;Visual Delusions: None noted  Mental Status Report Appear/Hygiene: Disheveled Eye Contact: Fair Motor Activity: Restlessness Speech: Soft;Slurred;Incoherent Level of Consciousness: Alert Mood: Preoccupied Affect: Appropriate to circumstance Anxiety Level: None Thought Processes: Circumstantial Judgement: Impaired Orientation: Person;Place;Time;Situation Obsessive Compulsive Thoughts/Behaviors: None  Cognitive Functioning Concentration: Decreased Memory: Recent Impaired;Remote Impaired IQ: Average Insight: Poor Impulse Control: Poor Appetite: Fair Weight Loss: 0  Weight Gain: 0  Sleep: Decreased Total Hours of Sleep: 4  Vegetative Symptoms: None  ADLScreening [facility] Assessment Services) Patient's cognitive ability adequate to safely complete daily activities?: Yes Patient able to express need for  assistance with ADLs?: Yes Independently performs ADLs?: Yes (appropriate for developmental age)  Abuse/Neglect [facility]) Physical Abuse: Denies Verbal Abuse: Denies Sexual Abuse: Denies  Prior Inpatient Therapy Prior Inpatient Therapy: Yes Prior Therapy Dates: 2013 Prior Therapy Facilty/Provider(s): [facility] Reason for Treatment: Psychosis  Prior Outpatient Therapy Prior Outpatient Therapy: Yes Prior Therapy Dates: current Prior Therapy Facilty/Provider(s): [facility] Reason for Treatment: med  ADL Screening (condition at time of admission) Patient's cognitive ability adequate to safely complete daily activities?: Yes Patient able to express need for assistance with ADLs?: Yes Independently performs ADLs?: Yes (appropriate for developmental age) Weakness of Legs: None Weakness of Arms/Hands: None  Home Assistive Devices/Equipment Home Assistive Devices/Equipment: None    Abuse/Neglect Assessment (Assessment to be complete while patient is alone) Physical Abuse: Denies Verbal Abuse: Denies Sexual Abuse: Denies Exploitation of patient/patient's resources: Denies Self-Neglect: Denies Values / Beliefs Cultural Requests During Hospitalization: None Spiritual Requests During Hospitalization: None Consults Spiritual Care Consult Needed: No Social Work Consult Needed: No Merchant navy officer (For Healthcare) Advance Directive: Patient does not have advance directive;Patient would not like information Nutrition Screen- [facility] Adult/WL/AP Patient's home diet: Other (Comment) (snack and coffee oferred, ADA)  Additional Information 1:1 In Past 12 Months?: No CIRT Risk: No Elopement Risk: No Does patient have medical clearance?: Yes     Disposition:  Disposition Disposition of Patient: Referred to;Inpatient treatment program Type of inpatient treatment program: Adult Other disposition(s): Referred to outside facility (Referred to outside faci) Patient referred to: Other  (Comment) (Pending [facility])  On Site Evaluation by:   Reviewed with  Physician:  Arlys John, Rennis Harding 08/18/2012 12:23 PM

## 2012-08-18 NOTE — BH Assessment (Signed)
Assessment Note   Update:  Pt accepted to Dr. Yetta Barre per [facility] @ [phone].  Updated EDP [place]i and ED staff.  Updated assessment disposition, completed assessment notification and faxed to [facility] to log.  ED staff to arrange transport via [facility] as pt is IVC.      Disposition:  Disposition Disposition of Patient: Inpatient treatment program Type of inpatient treatment program: Adult Other disposition(s): Referred to outside facility (Referred to outside faci) Patient referred to: Other (Comment) (Pt accepted [place])  On Site Evaluation by:   Reviewed with Physician:  Docia Barrier, Rennis Harding 08/18/2012 7:00 PM
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
## 2012-08-21 LAB — GLUCOSE, CAPILLARY: Glucose-Capillary: 93 mg/dL (ref 70–99)

## 2012-09-02 ENCOUNTER — Emergency Department (HOSPITAL_COMMUNITY)
Admission: EM | Admit: 2012-09-02 | Discharge: 2012-09-02 | Discharge status: Against Medical Advice | Payer: PRIVATE HEALTH INSURANCE | Attending: Emergency Medicine | Admitting: Emergency Medicine

## 2012-09-02 ENCOUNTER — Encounter (HOSPITAL_COMMUNITY): Payer: Self-pay

## 2012-09-02 DIAGNOSIS — H5710 Ocular pain, unspecified eye: Secondary | ICD-10-CM

## 2012-09-02 DIAGNOSIS — F3289 Other specified depressive episodes: Secondary | ICD-10-CM | POA: Insufficient documentation

## 2012-09-02 DIAGNOSIS — E119 Type 2 diabetes mellitus without complications: Secondary | ICD-10-CM | POA: Insufficient documentation

## 2012-09-02 DIAGNOSIS — Z8659 Personal history of other mental and behavioral disorders: Secondary | ICD-10-CM | POA: Insufficient documentation

## 2012-09-02 DIAGNOSIS — F329 Major depressive disorder, single episode, unspecified: Secondary | ICD-10-CM | POA: Insufficient documentation

## 2012-09-02 DIAGNOSIS — Z79899 Other long term (current) drug therapy: Secondary | ICD-10-CM | POA: Insufficient documentation

## 2012-09-02 DIAGNOSIS — S0510XA Contusion of eyeball and orbital tissues, unspecified eye, initial encounter: Secondary | ICD-10-CM | POA: Insufficient documentation

## 2012-09-02 DIAGNOSIS — E785 Hyperlipidemia, unspecified: Secondary | ICD-10-CM | POA: Insufficient documentation

## 2012-09-02 DIAGNOSIS — F17200 Nicotine dependence, unspecified, uncomplicated: Secondary | ICD-10-CM | POA: Insufficient documentation

## 2012-09-02 DIAGNOSIS — I10 Essential (primary) hypertension: Secondary | ICD-10-CM | POA: Insufficient documentation

## 2012-09-02 MED ORDER — TETRACAINE HCL 0.5 % OP SOLN
1.0000 [drp] | Freq: Once | OPHTHALMIC | Status: DC
  Filled 2012-09-02: qty 2
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
## 2012-09-03 NOTE — ED Provider Notes (Signed)
Medical screening examination/treatment/procedure(s) were performed by non-physician practitioner and as supervising physician I was immediately available for consultation/collaboration.  Flint Melter, MD 09/03/12 [phone]

## 2012-09-15 ENCOUNTER — Emergency Department (HOSPITAL_COMMUNITY)
Admission: EM | Admit: 2012-09-15 | Discharge: 2012-09-15 | Discharge status: Against Medical Advice | Payer: PRIVATE HEALTH INSURANCE | Attending: Emergency Medicine | Admitting: Emergency Medicine

## 2012-09-15 ENCOUNTER — Encounter (HOSPITAL_COMMUNITY): Payer: Self-pay | Admitting: *Deleted

## 2012-09-15 DIAGNOSIS — E119 Type 2 diabetes mellitus without complications: Secondary | ICD-10-CM | POA: Insufficient documentation

## 2012-09-15 DIAGNOSIS — F329 Major depressive disorder, single episode, unspecified: Secondary | ICD-10-CM | POA: Insufficient documentation

## 2012-09-15 DIAGNOSIS — F3289 Other specified depressive episodes: Secondary | ICD-10-CM | POA: Insufficient documentation

## 2012-09-15 DIAGNOSIS — F17200 Nicotine dependence, unspecified, uncomplicated: Secondary | ICD-10-CM | POA: Insufficient documentation

## 2012-09-15 DIAGNOSIS — I10 Essential (primary) hypertension: Secondary | ICD-10-CM | POA: Insufficient documentation

## 2012-09-15 DIAGNOSIS — F411 Generalized anxiety disorder: Secondary | ICD-10-CM | POA: Insufficient documentation

## 2012-09-15 DIAGNOSIS — E785 Hyperlipidemia, unspecified: Secondary | ICD-10-CM | POA: Insufficient documentation

## 2012-09-15 DIAGNOSIS — F200 Paranoid schizophrenia: Secondary | ICD-10-CM

## 2012-09-15 LAB — COMPREHENSIVE METABOLIC PANEL
ALT: 26 U/L (ref 0–35)
AST: 24 U/L (ref 0–37)
Albumin: 4 g/dL (ref 3.5–5.2)
Alkaline Phosphatase: 130 U/L — ABNORMAL HIGH (ref 39–117)
BUN: 6 mg/dL (ref 6–23)
CO2: 28 mEq/L (ref 19–32)
Calcium: 10.1 mg/dL (ref 8.4–10.5)
Chloride: 98 mEq/L (ref 96–112)
Creatinine, Ser: 0.5 mg/dL (ref 0.50–1.10)
GFR calc Af Amer: >90 mL/min (ref >90)
GFR calc non Af Amer: >90 mL/min (ref >90)
Glucose, Bld: 96 mg/dL (ref 70–99)
Potassium: 3.2 mEq/L — ABNORMAL LOW (ref 3.5–5.1)
Sodium: 135 mEq/L (ref 135–145)
Total Bilirubin: 0.3 mg/dL (ref 0.3–1.2)
Total Protein: 8.2 g/dL (ref 6.0–8.3)

## 2012-09-15 LAB — URINALYSIS, ROUTINE W REFLEX MICROSCOPIC
Glucose, UA: NEGATIVE mg/dL
Hgb urine dipstick: NEGATIVE
Ketones, ur: NEGATIVE mg/dL
Nitrite: NEGATIVE
Protein, ur: NEGATIVE mg/dL
Specific Gravity, Urine: 1.021 (ref 1.005–1.030)
Urobilinogen, UA: 4 mg/dL — ABNORMAL HIGH (ref 0.0–1.0)
pH: 7 (ref 5.0–8.0)

## 2012-09-15 LAB — CBC WITH DIFFERENTIAL/PLATELET
Basophils Absolute: 0.1 10*3/uL (ref 0.0–0.1)
Basophils Relative: 1 % (ref 0–1)
Eosinophils Absolute: 0.1 10*3/uL (ref 0.0–0.7)
Eosinophils Relative: 2 % (ref 0–5)
HCT: 39.3 % (ref 36.0–46.0)
Hemoglobin: 13.3 g/dL (ref 12.0–15.0)
Lymphocytes Relative: 38 % (ref 12–46)
Lymphs Abs: 2.4 10*3/uL (ref 0.7–4.0)
MCH: 24.6 pg — ABNORMAL LOW (ref 26.0–34.0)
MCHC: 33.8 g/dL (ref 30.0–36.0)
MCV: 72.8 fL — ABNORMAL LOW (ref 78.0–100.0)
Monocytes Absolute: 0.4 10*3/uL (ref 0.1–1.0)
Monocytes Relative: 7 % (ref 3–12)
Neutro Abs: 3.3 10*3/uL (ref 1.7–7.7)
Neutrophils Relative %: 52 % (ref 43–77)
Platelets: 277 10*3/uL (ref 150–400)
RBC: 5.4 MIL/uL — ABNORMAL HIGH (ref 3.87–5.11)
RDW: 14.8 % (ref 11.5–15.5)
WBC: 6.3 10*3/uL (ref 4.0–10.5)

## 2012-09-15 LAB — URINE MICROSCOPIC-ADD ON

## 2012-09-15 LAB — RAPID URINE DRUG SCREEN, HOSP PERFORMED
Amphetamines: NOT DETECTED
Barbiturates: NOT DETECTED
Benzodiazepines: NOT DETECTED
Cocaine: NOT DETECTED
Opiates: NOT DETECTED
Tetrahydrocannabinol: NOT DETECTED

## 2012-09-15 LAB — ETHANOL: Alcohol, Ethyl (B): <11 mg/dL (ref 0–11)

## 2012-09-15 NOTE — ED Notes (Signed)
The pt reports that she has spy problems and has not been taking her psy meds for one month.  Hearing voices and visions

## 2012-09-15 NOTE — ED Notes (Signed)
Pt refused to change into blue scrubs for behavioral heath. Pt left AMA

## 2012-09-15 NOTE — ED Provider Notes (Signed)
History   This chart was scribed for [employer]. Rubin Payor, MD by Sofie Rower, ED Scribe. The patient was seen in room TR05C/TR05C and the patient's care was started at 10:01PM.   Level 5 Caveat: Altered Mental Status/Hallucinations.   CSN: [id number]  Arrival date & time 09/15/12  2113   First MD Initiated Contact with Patient 09/15/12 2201      Chief Complaint  Patient presents with  . Psychiatric Evaluation    (Consider location/radiation/quality/duration/timing/severity/associated sxs/prior treatment) The history is provided by the patient. The history is limited by the condition of the patient. No language interpreter was used.    Shannon [name] is a 61 y.o. female , with a hx of anxiety and schizophrenia, who presents to the Emergency Department complaining of sudden, progressively worsening, hallucinations, onset today (09/15/12). The pt reports she has been off of her phychiatric medications for about a months time period. Furthermore, the pt informs she has been experiencing hallucinations; specifically, life events which have occurred in the past. The pt has an additional hx of hypertension, diabetes, hyperlipidemia, and depression.  The pt is a current everyday smoker (1.0 packs/day), however, she does not drink alcohol.   PCP is Dr. Concepcion Elk.    Past Medical History  Diagnosis Date  . Hypertension   . Diabetes mellitus   . Hyperlipidemia   . Depression   . Anxiety   . Schizophrenia     Past Surgical History  Procedure Date  . Breast surgery   . Exploratory laparotomy     Family History  Problem Relation Age of Onset  . Kidney disease Mother     History  Substance Use Topics  . Smoking status: Current Every Day Smoker -- 1.0 packs/day for 40 years    Types: Cigarettes  . Smokeless tobacco: Never Used  . Alcohol Use: No    OB History    Grav Para Term Preterm Abortions TAB SAB Ect Mult Living   0 0 0 0 0 0 0 0 0 0       Review of Systems  Unable to  perform ROS   Allergies  Review of patient's allergies indicates no known allergies.  Home Medications   No current outpatient prescriptions on file.  BP 158/76  Pulse 80  Temp 97.8 F (36.6 C) (Oral)  Resp 20  SpO2 99%  Physical Exam  Nursing note and vitals reviewed. Constitutional: She appears well-developed and well-nourished.  HENT:  Head: Atraumatic.  Nose: Nose normal.  Cardiovascular: Normal rate, regular rhythm and normal heart sounds.   Pulmonary/Chest: Effort normal and breath sounds normal.  Abdominal: Soft. Bowel sounds are normal. There is no tenderness.  Neurological: She is alert.  Skin: Skin is warm and dry.    ED Course  Procedures (including critical care time)  10:07 PM- Treatment plan concerning psychiatric evaluation discussed with patient. Pt agrees with treatment.    Results for orders placed during the hospital encounter of 09/15/12  CBC WITH DIFFERENTIAL      Component Value Range   WBC 6.3  4.0 - 10.5 K/uL   RBC 5.40 (*) 3.87 - 5.11 MIL/uL   Hemoglobin 13.3  12.0 - 15.0 g/dL   HCT 40.9  81.1 - 91.4 %   MCV 72.8 (*) 78.0 - 100.0 fL   MCH 24.6 (*) 26.0 - 34.0 pg   MCHC 33.8  30.0 - 36.0 g/dL   RDW 78.2  95.6 - 21.3 %   Platelets 277  150 - 400  K/uL   Neutrophils Relative PENDING  43 - 77 %   Neutro Abs PENDING  1.7 - 7.7 K/uL   Band Neutrophils PENDING  0 - 10 %   Lymphocytes Relative PENDING  12 - 46 %   Lymphs Abs PENDING  0.7 - 4.0 K/uL   Monocytes Relative PENDING  3 - 12 %   Monocytes Absolute PENDING  0.1 - 1.0 K/uL   Eosinophils Relative PENDING  0 - 5 %   Eosinophils Absolute PENDING  0.0 - 0.7 K/uL   Basophils Relative PENDING  0 - 1 %   Basophils Absolute PENDING  0.0 - 0.1 K/uL   WBC Morphology PENDING     RBC Morphology PENDING     Smear Review PENDING     nRBC PENDING  0 /100 WBC   Metamyelocytes Relative PENDING     Myelocytes PENDING     Promyelocytes Absolute PENDING     Blasts PENDING    COMPREHENSIVE  METABOLIC PANEL      Component Value Range   Sodium 135  135 - 145 mEq/L   Potassium 3.2 (*) 3.5 - 5.1 mEq/L   Chloride 98  96 - 112 mEq/L   CO2 28  19 - 32 mEq/L   Glucose, Bld 96  70 - 99 mg/dL   BUN 6  6 - 23 mg/dL   Creatinine, Ser 4.09  0.50 - 1.10 mg/dL   Calcium 81.1  8.4 - 91.4 mg/dL   Total Protein 8.2  6.0 - 8.3 g/dL   Albumin 4.0  3.5 - 5.2 g/dL   AST 24  0 - 37 U/L   ALT 26  0 - 35 U/L   Alkaline Phosphatase 130 (*) 39 - 117 U/L   Total Bilirubin 0.3  0.3 - 1.2 mg/dL   GFR calc non Af Amer >90  >90 mL/min   GFR calc Af Amer >90  >90 mL/min  ETHANOL      Component Value Range   Alcohol, Ethyl (B) <11  0 - 11 mg/dL      No results found.   1. Schizophrenia, paranoid type       MDM  Patient presents with hallucinations and medication noncompliance. She states that she wants treatment. No SI or HI. She decided later that she didn't want treatment. She is awake and appears to have capacity to decide not to get treatment at this time. She knows she is at the hospital and does not want further treatment. She is aware that this may worsen her psychiatric disorders.       I personally performed the services described in this documentation, which was scribed in my presence. The recorded information has been reviewed and is accurate.     Juliet Rude. Rubin Payor, MD 09/15/12 2241

## 2012-09-15 NOTE — ED Notes (Signed)
Pt has attempted twice to get urine for a sample.  First time pt stated she forgot to collect a sample, the second time she stated she was unable to go at this time.  WIll continue to get a specimen for testing

## 2012-09-22 ENCOUNTER — Other Ambulatory Visit: Payer: Self-pay | Admitting: Obstetrics & Gynecology

## 2012-09-22 ENCOUNTER — Other Ambulatory Visit: Payer: Self-pay | Admitting: Medical

## 2012-09-22 DIAGNOSIS — N952 Postmenopausal atrophic vaginitis: Secondary | ICD-10-CM

## 2012-09-22 MED ORDER — CONJ ESTROG-MEDROXYPROGEST ACE 0.625-2.5 MG PO TABS: 1.0000 | ORAL_TABLET | Freq: Every day | ORAL | Status: DC

## 2012-09-22 NOTE — Telephone Encounter (Signed)
Candy from pharmacy called requesting a refill for Shannon [name] for Premarin 0.6-5 mg cream. Call in refill or fax to [phone]

## 2012-09-23 ENCOUNTER — Other Ambulatory Visit: Payer: Self-pay | Admitting: Obstetrics & Gynecology

## 2012-09-23 DIAGNOSIS — N952 Postmenopausal atrophic vaginitis: Secondary | ICD-10-CM

## 2012-09-23 MED ORDER — MEDROXYPROGESTERONE ACETATE 2.5 MG PO TABS: 2.5000 mg | ORAL_TABLET | Freq: Every day | ORAL | Status: DC

## 2012-09-23 MED ORDER — ESTROGENS CONJUGATED 0.625 MG PO TABS: 0.6250 mg | ORAL_TABLET | Freq: Every day | ORAL | Status: DC

## 2012-09-23 NOTE — Telephone Encounter (Signed)
Discussed with Dr. Erin Fulling. She has e-Rx'd premarin and provera to the patient. Dr. Erin Fulling attempted to contact patient to discuss, but was unable to reach her.

## 2012-09-23 NOTE — Telephone Encounter (Signed)
Message received from pt's pharmacy that the Prempro prescribed by Dr. Erin Fulling is not covered by pt's insurance. In-basket message sent to Dr. Erin Fulling for response.

## 2012-09-24 NOTE — Telephone Encounter (Signed)
Called pt to notify her of change in medication per Dr. Erin Fulling.  I left a message on her Mobile phone voice mail to call back and indicate whether or not we may provide detailed information on her voice mail.  ** Dr. Erin Fulling has ordered Premarin and Provera. Pt needs to be advised that it is important for her to take BOTH meds.

## 2012-09-26 MED ORDER — ESTRADIOL 0.5 MG PO TABS: 0.5000 mg | ORAL_TABLET | Freq: Every day | ORAL | Status: DC

## 2012-09-26 NOTE — Telephone Encounter (Signed)
Pt's pharmacy had called and stated that the Premarin is also not covered by pt's insurance. I called pt's insurance pre-auth dept and was able to get estradiol authorized after approval from Dr. Erin Fulling. I called pt and left message on her mobile # that it is very important we speak to her about her medications. She may call back and leave a message on nurse voice mail- if it is after 1130 today, we may not be able to speak to her until Monday 09/29/12.  ** Pt needs to be informed that Dr. Erin Fulling has prescribed another medication for her and it is important that she take BOTH hormones (estradiol AND provera).

## 2012-10-08 NOTE — Telephone Encounter (Addendum)
Called pt and left message on cell phone that I have a message for her from the doctor regarding her medication. Please call back and indicate when she can be reached.  **If pt does not return the call within 24 hrs, a letter will be mailed regarding the medication information. 12/23- pt has not returned any of our messages, therefore certified letter has been sent.

## 2012-10-13 ENCOUNTER — Encounter: Payer: Self-pay | Admitting: *Deleted

## 2012-10-25 ENCOUNTER — Emergency Department (HOSPITAL_COMMUNITY)
Admission: EM | Admit: 2012-10-25 | Discharge: 2012-10-25 | Disposition: A | Discharge status: Home / Self Care | Payer: PRIVATE HEALTH INSURANCE | Attending: Emergency Medicine | Admitting: Emergency Medicine

## 2012-10-25 ENCOUNTER — Encounter (HOSPITAL_COMMUNITY): Payer: Self-pay | Admitting: *Deleted

## 2012-10-25 DIAGNOSIS — E119 Type 2 diabetes mellitus without complications: Secondary | ICD-10-CM | POA: Insufficient documentation

## 2012-10-25 DIAGNOSIS — M1990 Unspecified osteoarthritis, unspecified site: Secondary | ICD-10-CM

## 2012-10-25 DIAGNOSIS — Z79899 Other long term (current) drug therapy: Secondary | ICD-10-CM | POA: Insufficient documentation

## 2012-10-25 DIAGNOSIS — I10 Essential (primary) hypertension: Secondary | ICD-10-CM | POA: Insufficient documentation

## 2012-10-25 DIAGNOSIS — M129 Arthropathy, unspecified: Secondary | ICD-10-CM | POA: Insufficient documentation

## 2012-10-25 DIAGNOSIS — E785 Hyperlipidemia, unspecified: Secondary | ICD-10-CM | POA: Insufficient documentation

## 2012-10-25 DIAGNOSIS — R222 Localized swelling, mass and lump, trunk: Secondary | ICD-10-CM

## 2012-10-25 DIAGNOSIS — F17200 Nicotine dependence, unspecified, uncomplicated: Secondary | ICD-10-CM | POA: Insufficient documentation

## 2012-10-25 NOTE — ED Provider Notes (Signed)
Medical screening examination/treatment/procedure(s) were performed by non-physician practitioner and as supervising physician I was immediately available for consultation/collaboration.  Dejaun Vidrio M Aster Eckrich, MD 10/25/12 0650 

## 2012-10-25 NOTE — ED Notes (Signed)
Pt c/o boil on her buttocks x 2 weeks, states it "needs burst".

## 2012-10-25 NOTE — ED Notes (Signed)
The PA examined the patient's boil and stated that he was going to get the ultrasound machine to assess how deep it was.  Once the PA left the room, the patient started to get dressed.  When the PA arrived at the room, the patient advised that she did not need the ultrasound, but she was thankful for our help.  The PA advised he would prepare the patient's discharge instructions, but she was not in the room when I got back with them.

## 2012-10-25 NOTE — ED Provider Notes (Signed)
History     CSN: [id number]  Arrival date & time 10/25/12  0351   First MD Initiated Contact with Patient 10/25/12 0355      Chief Complaint  Patient presents with  . Abscess   HPI  History provided by the patient. Patient is a 62 year old female with history of hypertension, diabetes, hyperlipidemia and schizophrenia who presents with concerns for abscess the left buttocks for the past 2 weeks. She denies having any pain from the area reports feeling small nodule. Denies any bleeding or drainage from the area. Patient also has secondary complaints of some "arthritis pain" in her left shoulder and elbows areas. Patient states that she would like to have some creams to put over her joints. Patient denies any other aggravating or alleviating factors. Denies any other associated symptoms. Denies any fever, chills or sweats.    Past Medical History  Diagnosis Date  . Hypertension   . Diabetes mellitus   . Hyperlipidemia   . Depression   . Anxiety   . Schizophrenia     Past Surgical History  Procedure Date  . Breast surgery   . Exploratory laparotomy     Family History  Problem Relation Age of Onset  . Kidney disease Mother     History  Substance Use Topics  . Smoking status: Current Every Day Smoker -- 1.0 packs/day for 40 years    Types: Cigarettes  . Smokeless tobacco: Never Used  . Alcohol Use: No    OB History    Grav Para Term Preterm Abortions TAB SAB Ect Mult Living   0 0 0 0 0 0 0 0 0 0       Review of Systems  Constitutional: Negative for fever and chills.  All other systems reviewed and are negative.    Allergies  Review of patient's allergies indicates no known allergies.  Home Medications   Current Outpatient Rx  Name  Route  Sig  Dispense  Refill  . ESTRADIOL 0.5 MG PO TABS   Oral   Take 1 tablet (0.5 mg total) by mouth daily.   30 tablet   6     This medication has been pre-authorized until 12/6 ...   . MEDROXYPROGESTERONE ACETATE 2.5  MG PO TABS   Oral   Take 1 tablet (2.5 mg total) by mouth daily.   30 tablet   6     BP 150/82  Pulse 97  Temp 97.4 F (36.3 C) (Oral)  Resp 16  SpO2 100%  Physical Exam  Nursing note and vitals reviewed. Constitutional: She is oriented to person, place, and time. She appears well-developed and well-nourished. No distress.  HENT:  Head: Normocephalic.  Cardiovascular: Normal rate and regular rhythm.   Pulmonary/Chest: Effort normal and breath sounds normal.  Musculoskeletal: Normal range of motion. She exhibits no edema.       Normal strength in extremities  Neurological: She is alert and oriented to person, place, and time.  Skin: Skin is warm and dry.       Small 1 cm nodularity to left buttocks without tenderness or significant fluctuance. No pointing. No erythema or induration of skin.  Psychiatric: She has a normal mood and affect.    ED Course  Procedures     1. Arthritis   2. Buttocks nodule       MDM  4:00 AM patient seen and evaluated. Patient appears well in no acute distress.  Patient has no significant tenderness to the small nodular area  of left buttocks. This may be a small developing abscess. Patient now states that she does not wish to have any further evaluation or treatment. Does not wish to have bedside ultrasound performed. Patient prefers to return home and followup with PCP at this time. Patient advised use warm compresses and told she may return at anytime for further evaluation and treatment.        Angus Seller, [place] 10/25/12 [phone]

## 2012-10-25 NOTE — ED Notes (Signed)
The patient states that she has had a large boil on her left buttock for 2 weeks.

## 2012-10-26 ENCOUNTER — Emergency Department (HOSPITAL_COMMUNITY)
Admission: EM | Admit: 2012-10-26 | Discharge: 2012-10-28 | Disposition: A | Discharge status: Other Acute Inpatient Hospital | Payer: PRIVATE HEALTH INSURANCE | Attending: Emergency Medicine | Admitting: Emergency Medicine

## 2012-10-26 ENCOUNTER — Encounter (HOSPITAL_COMMUNITY): Payer: Self-pay | Admitting: *Deleted

## 2012-10-26 DIAGNOSIS — E785 Hyperlipidemia, unspecified: Secondary | ICD-10-CM | POA: Insufficient documentation

## 2012-10-26 DIAGNOSIS — F329 Major depressive disorder, single episode, unspecified: Secondary | ICD-10-CM | POA: Insufficient documentation

## 2012-10-26 DIAGNOSIS — I10 Essential (primary) hypertension: Secondary | ICD-10-CM | POA: Insufficient documentation

## 2012-10-26 DIAGNOSIS — E119 Type 2 diabetes mellitus without complications: Secondary | ICD-10-CM | POA: Insufficient documentation

## 2012-10-26 DIAGNOSIS — IMO0002 Reserved for concepts with insufficient information to code with codable children: Secondary | ICD-10-CM | POA: Insufficient documentation

## 2012-10-26 DIAGNOSIS — R4689 Other symptoms and signs involving appearance and behavior: Secondary | ICD-10-CM

## 2012-10-26 DIAGNOSIS — F200 Paranoid schizophrenia: Secondary | ICD-10-CM | POA: Insufficient documentation

## 2012-10-26 DIAGNOSIS — Z9114 Patient's other noncompliance with medication regimen: Secondary | ICD-10-CM

## 2012-10-26 DIAGNOSIS — Y939 Activity, unspecified: Secondary | ICD-10-CM | POA: Insufficient documentation

## 2012-10-26 DIAGNOSIS — F411 Generalized anxiety disorder: Secondary | ICD-10-CM | POA: Insufficient documentation

## 2012-10-26 DIAGNOSIS — F3289 Other specified depressive episodes: Secondary | ICD-10-CM | POA: Insufficient documentation

## 2012-10-26 DIAGNOSIS — Z91199 Patient's noncompliance with other medical treatment and regimen due to unspecified reason: Secondary | ICD-10-CM | POA: Insufficient documentation

## 2012-10-26 DIAGNOSIS — Y929 Unspecified place or not applicable: Secondary | ICD-10-CM | POA: Insufficient documentation

## 2012-10-26 DIAGNOSIS — Z9119 Patient's noncompliance with other medical treatment and regimen: Secondary | ICD-10-CM | POA: Insufficient documentation

## 2012-10-26 DIAGNOSIS — S0990XA Unspecified injury of head, initial encounter: Secondary | ICD-10-CM | POA: Insufficient documentation

## 2012-10-26 LAB — CBC WITH DIFFERENTIAL/PLATELET
Basophils Absolute: 0 10*3/uL (ref 0.0–0.1)
Basophils Relative: 1 % (ref 0–1)
Eosinophils Absolute: 0.1 10*3/uL (ref 0.0–0.7)
Eosinophils Relative: 1 % (ref 0–5)
HCT: 35 % — ABNORMAL LOW (ref 36.0–46.0)
Hemoglobin: 11.6 g/dL — ABNORMAL LOW (ref 12.0–15.0)
Lymphocytes Relative: 43 % (ref 12–46)
Lymphs Abs: 3.3 10*3/uL (ref 0.7–4.0)
MCH: 24 pg — ABNORMAL LOW (ref 26.0–34.0)
MCHC: 33.1 g/dL (ref 30.0–36.0)
MCV: 72.5 fL — ABNORMAL LOW (ref 78.0–100.0)
Monocytes Absolute: 0.7 10*3/uL (ref 0.1–1.0)
Monocytes Relative: 9 % (ref 3–12)
Neutro Abs: 3.6 10*3/uL (ref 1.7–7.7)
Neutrophils Relative %: 47 % (ref 43–77)
Platelets: 231 10*3/uL (ref 150–400)
RBC: 4.83 MIL/uL (ref 3.87–5.11)
RDW: 14.3 % (ref 11.5–15.5)
WBC: 7.7 10*3/uL (ref 4.0–10.5)

## 2012-10-26 LAB — COMPREHENSIVE METABOLIC PANEL
ALT: 12 U/L (ref 0–35)
AST: 18 U/L (ref 0–37)
Albumin: 3.4 g/dL — ABNORMAL LOW (ref 3.5–5.2)
Alkaline Phosphatase: 103 U/L (ref 39–117)
BUN: 9 mg/dL (ref 6–23)
CO2: 24 mEq/L (ref 19–32)
Calcium: 9.4 mg/dL (ref 8.4–10.5)
Chloride: 97 mEq/L (ref 96–112)
Creatinine, Ser: 0.85 mg/dL (ref 0.50–1.10)
GFR calc Af Amer: 85 mL/min — ABNORMAL LOW (ref >90)
GFR calc non Af Amer: 74 mL/min — ABNORMAL LOW (ref >90)
Glucose, Bld: 118 mg/dL — ABNORMAL HIGH (ref 70–99)
Potassium: 3.1 mEq/L — ABNORMAL LOW (ref 3.5–5.1)
Sodium: 133 mEq/L — ABNORMAL LOW (ref 135–145)
Total Bilirubin: 0.5 mg/dL (ref 0.3–1.2)
Total Protein: 6.8 g/dL (ref 6.0–8.3)

## 2012-10-26 LAB — ETHANOL: Alcohol, Ethyl (B): <11 mg/dL (ref 0–11)

## 2012-10-26 MED ORDER — NICOTINE 21 MG/24HR TD PT24
21.0000 mg | MEDICATED_PATCH | Freq: Every day | TRANSDERMAL | Status: DC
  Administered 2012-10-26: 21 mg via TRANSDERMAL
  Filled 2012-10-26: qty 1

## 2012-10-26 MED ORDER — ZIPRASIDONE MESYLATE 20 MG IM SOLR
20.0000 mg | Freq: Two times a day (BID) | INTRAMUSCULAR | Status: DC | PRN
  Administered 2012-10-26: 20 mg via INTRAMUSCULAR
  Filled 2012-10-26: qty 20

## 2012-10-26 NOTE — ED Notes (Signed)
Called no answer
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
## 2012-10-26 NOTE — ED Notes (Signed)
Pt highly agitated, manic in behavior with paranoid delusions. IVC papers initiated due to pt wanting to leave the ER. Pt noncompliant and attempted to leave ER. PRN Geodon 20mg  administered in right deltoid per EDP order. Pt changed into blue scrubs, personal items secured and removed from room, and pt wanded by security. Charge RN notified that pt was IVC status and needed a sitter at the bedside.

## 2012-10-26 NOTE — ED Notes (Signed)
Pt's sister Deatra Ina would like to be updated on progress/POC for pt. Her number is [phone]

## 2012-10-26 NOTE — ED Notes (Addendum)
Pt belongings inventoried and placed in Lockers 10 and 11.  Pt's black suitcase containing clothing and personal hygiene items on floor beside lockers because it would not fit in a locker.

## 2012-10-27 ENCOUNTER — Emergency Department (HOSPITAL_COMMUNITY): Payer: PRIVATE HEALTH INSURANCE

## 2012-10-27 MED ORDER — LORAZEPAM 2 MG/ML IJ SOLN: 2.0000 mg | Freq: Four times a day (QID) | INTRAMUSCULAR | Status: DC | PRN

## 2012-10-27 MED ORDER — ZIPRASIDONE MESYLATE 20 MG IM SOLR: 20.0000 mg | Freq: Two times a day (BID) | INTRAMUSCULAR | Status: DC | PRN

## 2012-10-27 MED ORDER — ZIPRASIDONE MESYLATE 20 MG IM SOLR
20.0000 mg | Freq: Once | INTRAMUSCULAR | Status: DC
Start: 2012-10-27 — End: 2012-10-28

## 2012-10-27 MED ORDER — LORAZEPAM 2 MG/ML IJ SOLN
2.0000 mg | Freq: Once | INTRAMUSCULAR | Status: AC
  Administered 2012-10-27: 2 mg via INTRAMUSCULAR
  Filled 2012-10-27: qty 1

## 2012-10-27 MED ORDER — HALOPERIDOL LACTATE 5 MG/ML IJ SOLN
5.0000 mg | Freq: Once | INTRAMUSCULAR | Status: AC
  Administered 2012-10-27: 5 mg via INTRAMUSCULAR
  Filled 2012-10-27: qty 1

## 2012-10-27 NOTE — ED Provider Notes (Signed)
Shannon [name] is a 62 y.o. female who was being evaluated for recurrent psychosis. She is required parenteral antipsychotics to control her behavior. This morning. She receive Geodon, followed by Haldol, and Ativan; between 5 and 6 AM. At 11:00, she remained sedated, and sleeping soundly.  She's been seen by Telepsych history, who recommends inpatient psychiatric hospitalization.  Head CT is negative.     Involuntary commitment signed by me     Assessment: Psychosis  Plan; placed in a psychiatric facility  Flint Melter, MD 10/27/12 1146

## 2012-10-27 NOTE — ED Notes (Addendum)
Pt wake up from sleep,started running toward the Side locked door, when asked. she stated going to the  Bathroom. took pt to bathroom voided without any problem. As pt comes out of the bathroom,, she refused to go back to room, stated " I am going home" taking scrubs and off and security called . Pt  running naked down the hall. Pt was stopped by staff and taking back to room. Pt oriented to room and TV.   Moved pt from C 21 to C 22 to be closer to desk.

## 2012-10-27 NOTE — ED Notes (Signed)
Patient stripped down naked, refuses vitals.  Patient did get back in bed and is watching television.

## 2012-10-27 NOTE — ED Provider Notes (Signed)
Patient is grossly psychotic. She awoke and ran through the halls naked. She is screaming and being belligerent. She pulled code blue alarms. She required chemical sedation  Psychiatry consult complete and recommends inpatient treatment geodon and ativan ordered prn per their recommendations.  BP 148/86  Pulse 99  Temp 97.6 F (36.4 C) (Oral)  Resp 16  SpO2 98%  CRITICAL CARE Performed by: Glynn Octave   Total critical care time: 30  Critical care time was exclusive of separately billable procedures and treating other patients.  Critical care was necessary to treat or prevent imminent or life-threatening deterioration.  Critical care was time spent personally by me on the following activities: development of treatment plan with patient and/or surrogate as well as nursing, discussions with consultants, evaluation of patient's response to treatment, examination of patient, obtaining history from patient or surrogate, ordering and performing treatments and interventions, ordering and review of laboratory studies, ordering and review of radiographic studies, pulse oximetry and re-evaluation of patient's condition.   Glynn Octave, MD 10/27/12 [phone]

## 2012-10-27 NOTE — ED Notes (Signed)
Patient received meal tray.   Care transferred and report given to Clydie Braun, [place]

## 2012-10-27 NOTE — ED Notes (Signed)
Pt agitated and speaking and singing loud, going in and out the bathroom frequently. Water engineer for pt.
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
## 2012-10-27 NOTE — ED Notes (Signed)
Patient refuses EKG 

## 2012-10-27 NOTE — ED Notes (Signed)
Patient had telepsych.

## 2012-10-27 NOTE — BH Assessment (Signed)
[facility] Assessment Progress Note      Attempted to assess pt.  She is asleep and has gone for a CT scan.  Will assess pt when she is awake.

## 2012-10-27 NOTE — ED Notes (Signed)
Patient given new set of scrubs and advised that she needs to get dressed.

## 2012-10-27 NOTE — ED Notes (Signed)
Per NS, waiting on notary to get telepsych set up for patient.

## 2012-10-27 NOTE — ED Notes (Signed)
Patient stripped off clothes again and attempting to get bath in sink.  Patient washed off, given a new pair of scrubs.  Patient was loud and cursing.  MD aware.  Will give medications as ordered.

## 2012-10-28 ENCOUNTER — Inpatient Hospital Stay (HOSPITAL_COMMUNITY)
Admission: EM | Admit: 2012-10-28 | Discharge: 2012-11-04 | DRG: 885 | Disposition: A | Discharge status: Home / Self Care | Payer: 59 | Source: Ambulatory Visit | Attending: Psychiatry | Admitting: Psychiatry

## 2012-10-28 ENCOUNTER — Encounter (HOSPITAL_COMMUNITY): Payer: Self-pay | Admitting: Behavioral Health

## 2012-10-28 DIAGNOSIS — Z780 Asymptomatic menopausal state: Secondary | ICD-10-CM

## 2012-10-28 DIAGNOSIS — Z5900 Homelessness unspecified: Secondary | ICD-10-CM

## 2012-10-28 DIAGNOSIS — N898 Other specified noninflammatory disorders of vagina: Secondary | ICD-10-CM

## 2012-10-28 DIAGNOSIS — Z79899 Other long term (current) drug therapy: Secondary | ICD-10-CM

## 2012-10-28 DIAGNOSIS — E119 Type 2 diabetes mellitus without complications: Secondary | ICD-10-CM | POA: Diagnosis present

## 2012-10-28 DIAGNOSIS — F200 Paranoid schizophrenia: Principal | ICD-10-CM | POA: Diagnosis present

## 2012-10-28 DIAGNOSIS — I10 Essential (primary) hypertension: Secondary | ICD-10-CM | POA: Diagnosis present

## 2012-10-28 LAB — RAPID URINE DRUG SCREEN, HOSP PERFORMED
Amphetamines: NOT DETECTED
Barbiturates: NOT DETECTED
Benzodiazepines: NOT DETECTED
Cocaine: NOT DETECTED
Opiates: NOT DETECTED
Tetrahydrocannabinol: NOT DETECTED

## 2012-10-28 LAB — GLUCOSE, CAPILLARY: Glucose-Capillary: 128 mg/dL — ABNORMAL HIGH (ref 70–99)

## 2012-10-28 MED ORDER — HALOPERIDOL 5 MG PO TABS
5.0000 mg | ORAL_TABLET | Freq: Two times a day (BID) | ORAL | Status: DC
  Administered 2012-10-29: 5 mg via ORAL
  Filled 2012-10-28 (×4): qty 1

## 2012-10-28 MED ORDER — HALOPERIDOL 5 MG PO TABS
5.0000 mg | ORAL_TABLET | Freq: Two times a day (BID) | ORAL | Status: DC
  Administered 2012-10-28: 5 mg via ORAL
  Filled 2012-10-28: qty 1

## 2012-10-28 MED ORDER — ALUM & MAG HYDROXIDE-SIMETH 200-200-20 MG/5ML PO SUSP
30.0000 mL | ORAL | Status: DC | PRN
  Administered 2012-11-01 – 2012-11-03 (×2): 30 mL via ORAL

## 2012-10-28 MED ORDER — ACETAMINOPHEN 325 MG PO TABS: 650.0000 mg | ORAL_TABLET | Freq: Four times a day (QID) | ORAL | Status: DC | PRN

## 2012-10-28 MED ORDER — MAGNESIUM HYDROXIDE 400 MG/5ML PO SUSP: 30.0000 mL | Freq: Every day | ORAL | Status: DC | PRN

## 2012-10-28 NOTE — Progress Notes (Signed)
The focus of this group is to help patients review their daily goal of treatment and discuss progress on daily workbooks. Pt attended the evening group session, but laid down on the couch and slept when not being directly spoken to. Pt reported that she had a good day, but that her head was hurting and requested a cold pack. Pt was given a cold pack after group.
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
## 2012-10-28 NOTE — BH Assessment (Signed)
Assessment Note  Update:  Received call from [facility] stating pt accepted by Donell Sievert, PA to Dr. Jannifer Franklin to bed 405-2 and that pt could be transported to [facility].  Pt unable to sign support paperwork due to being psychotic.  Updated EDP [place] and ED staff.  ED staff to arrange transport to [facility] via [employer], as pt is IVC.  Updated assessment disposition, completed assessment notification and support paperwork.  Faxed to [facility] to log.     Disposition:  Disposition Disposition of Patient: Inpatient treatment program Type of inpatient treatment program: Adult Patient referred to: Other (Comment) (Pt accepted [facility])  On Site Evaluation by:   Reviewed with Physician:  Birdie Hopes 10/28/2012 12:09 PM

## 2012-10-28 NOTE — ED Provider Notes (Signed)
Pt more calm, brushing her teeth She is ambulatory She is accepted to [facility]   Date: 10/28/2012  Rate: 71  Rhythm: normal sinus rhythm  QRS Axis: normal  Intervals: normal  ST/T Wave abnormalities: normal  Conduction Disutrbances:none     Joya Gaskins, MD 10/28/12 1220

## 2012-10-28 NOTE — Tx Team (Signed)
Initial Interdisciplinary Treatment Plan  PATIENT STRENGTHS: (choose at least two) Ability for insight Motivation for treatment/growth  PATIENT STRESSORS: Financial difficulties Medication change or noncompliance   PROBLEM LIST: Problem List/Patient Goals Date to be addressed Date deferred Reason deferred Estimated date of resolution  Psychosis  10/28/12                                                        DISCHARGE CRITERIA:  Ability to meet basic life and health needs Adequate post-discharge living arrangements Improved stabilization in mood, thinking, and/or behavior Verbal commitment to aftercare and medication compliance  PRELIMINARY DISCHARGE PLAN: Attend aftercare/continuing care group Attend PHP/IOP Placement in alternative living arrangements  PATIENT/FAMIILY INVOLVEMENT: This treatment plan has been presented to and reviewed with the patient, Shannon [name], and/or family member. The patient and family have been given the opportunity to ask questions and make suggestions.  Shannon [name] L 10/28/2012, 6:43 PM

## 2012-10-28 NOTE — Progress Notes (Signed)
Patient ID: Shannon [name], female   DOB: February 04, 1951, 62 y.o.   MRN: [id number] PER STATE REGULATIONS 482.30  THIS CHART WAS REVIEWED FOR MEDICAL NECESSITY WITH RESPECT TO THE PATIENT'S ADMISSION/ DURATION OF STAY.  NEXT REVIEW DATE: 10/31/2012  Willa Rough, RN, BSN CASE MANAGER

## 2012-10-29 MED ORDER — SIMVASTATIN 20 MG PO TABS
20.0000 mg | ORAL_TABLET | Freq: Every day | ORAL | Status: DC
  Administered 2012-10-29 – 2012-11-03 (×6): 20 mg via ORAL
  Filled 2012-10-29 (×5): qty 1
  Filled 2012-10-29 (×2): qty 7
  Filled 2012-10-29 (×2): qty 1

## 2012-10-29 MED ORDER — PANTOPRAZOLE SODIUM 40 MG PO TBEC
40.0000 mg | DELAYED_RELEASE_TABLET | Freq: Every day | ORAL | Status: DC
  Administered 2012-10-29 – 2012-11-04 (×7): 40 mg via ORAL
  Filled 2012-10-29 (×7): qty 1
  Filled 2012-10-29: qty 7
  Filled 2012-10-29: qty 1
  Filled 2012-10-29: qty 7

## 2012-10-29 MED ORDER — BENZTROPINE MESYLATE 1 MG PO TABS
1.0000 mg | ORAL_TABLET | Freq: Two times a day (BID) | ORAL | Status: DC
  Administered 2012-10-29 – 2012-11-04 (×13): 1 mg via ORAL
  Filled 2012-10-29: qty 1
  Filled 2012-10-29: qty 14
  Filled 2012-10-29 (×3): qty 1
  Filled 2012-10-29: qty 14
  Filled 2012-10-29 (×7): qty 1
  Filled 2012-10-29: qty 14
  Filled 2012-10-29 (×3): qty 1
  Filled 2012-10-29: qty 14
  Filled 2012-10-29: qty 1

## 2012-10-29 MED ORDER — HYDROCHLOROTHIAZIDE 12.5 MG PO CAPS
12.5000 mg | ORAL_CAPSULE | Freq: Every day | ORAL | Status: DC
  Administered 2012-10-29 – 2012-11-04 (×7): 12.5 mg via ORAL
  Filled 2012-10-29: qty 1
  Filled 2012-10-29: qty 7
  Filled 2012-10-29 (×5): qty 1
  Filled 2012-10-29: qty 7
  Filled 2012-10-29 (×2): qty 1

## 2012-10-29 MED ORDER — PERPHENAZINE 4 MG PO TABS
8.0000 mg | ORAL_TABLET | Freq: Two times a day (BID) | ORAL | Status: DC
  Administered 2012-10-29 – 2012-11-04 (×13): 8 mg via ORAL
  Filled 2012-10-29: qty 1
  Filled 2012-10-29 (×2): qty 28
  Filled 2012-10-29 (×6): qty 1
  Filled 2012-10-29: qty 28
  Filled 2012-10-29 (×5): qty 1
  Filled 2012-10-29: qty 28
  Filled 2012-10-29 (×3): qty 1

## 2012-10-29 MED ORDER — AMLODIPINE BESYLATE 5 MG PO TABS
5.0000 mg | ORAL_TABLET | Freq: Every day | ORAL | Status: DC
  Administered 2012-10-29 – 2012-11-04 (×7): 5 mg via ORAL
  Filled 2012-10-29 (×5): qty 1
  Filled 2012-10-29: qty 7
  Filled 2012-10-29: qty 1
  Filled 2012-10-29: qty 7
  Filled 2012-10-29 (×2): qty 1

## 2012-10-29 MED ORDER — TRAZODONE HCL 100 MG PO TABS
150.0000 mg | ORAL_TABLET | Freq: Every day | ORAL | Status: DC
  Administered 2012-10-29 – 2012-11-03 (×6): 150 mg via ORAL
  Filled 2012-10-29 (×5): qty 3
  Filled 2012-10-29 (×2): qty 11
  Filled 2012-10-29 (×2): qty 3

## 2012-10-29 MED ORDER — IRBESARTAN 75 MG PO TABS
150.0000 mg | ORAL_TABLET | Freq: Every day | ORAL | Status: DC
  Administered 2012-10-29 – 2012-11-04 (×7): 150 mg via ORAL
  Filled 2012-10-29 (×3): qty 1
  Filled 2012-10-29: qty 14
  Filled 2012-10-29 (×4): qty 1
  Filled 2012-10-29: qty 14
  Filled 2012-10-29 (×3): qty 1

## 2012-10-29 MED ORDER — ASPIRIN EC 81 MG PO TBEC
81.0000 mg | DELAYED_RELEASE_TABLET | Freq: Every day | ORAL | Status: DC
  Administered 2012-10-29 – 2012-11-04 (×7): 81 mg via ORAL
  Filled 2012-10-29 (×9): qty 1

## 2012-10-29 MED ORDER — PIOGLITAZONE HCL 15 MG PO TABS
15.0000 mg | ORAL_TABLET | Freq: Every day | ORAL | Status: DC
  Administered 2012-10-29 – 2012-11-04 (×7): 15 mg via ORAL
  Filled 2012-10-29: qty 7
  Filled 2012-10-29 (×6): qty 1
  Filled 2012-10-29: qty 7
  Filled 2012-10-29 (×2): qty 1

## 2012-10-29 NOTE — Progress Notes (Signed)
Patient ID: ROCQUEL ASKREN, female   DOB: [birth year], 62 y.o.   MRN: [id number] D: Pt is awake and active on the unit this AM. Pt denies SI/HI and A/V hallucinations. Pt is participating in the milieu and is cooperative with staff. Pt mood is sad and her affect is anxious. Pt writes that she plans to get her own one bedroom apt after d/c. Pt is somewhat isolative but pleasant with staff today. Pt is guarded but is able to express her needs.   A: Writer utilized therapeutic communication, encouraged pt to discuss feelings with staff and administered medication per MD orders. Writer also encouraged pt to attend groups. Writer also encouraged her to continue taking her medications after discharge. She expressed some confusion about why she is here, but accepted that being off her medications led to her admission at [facility]. Pt then acknowledged that she knows she has to take responsibility for her mental illness but sometimes she does not want to. Writer encouraged her to continue taking her medications even though it seems like so many. And the side effects are not as bad as the consequences if she doesn't.   R: Pt is attending groups and tolerating medications well. Writer will continue to monitor. 15 minute checks are ongoing for safety.

## 2012-10-29 NOTE — Treatment Plan (Signed)
Interdisciplinary Treatment Plan Update (Adult)  Date: 10/29/2012  Time Reviewed: 8:13 AM   Progress in Treatment: Attending groups: Yes Participating in groups: Yes Taking medication as prescribed: Yes Tolerating medication: Yes   Family/Significant other contact made:  No Patient understands diagnosis:  No Limited insight Discussing patient identified problems/goals with staff:  Yes  See below Medical problems stabilized or resolved:  Yes Denies suicidal/homicidal ideation: Yes In tx team Issues/concerns per patient self-inventory:  Yes C/O pain and wants an ice pack  Did not rate depression or hopelessness  Writes "Get my own one bedroom apt." Other:  New problem(s) identified: N/A  Reason for Continuation of Hospitalization: Hallucinations Medication stabilization  Interventions implemented related to continuation of hospitalization: Restart meds from last time she was here  Encourage group attendance and participation  15 min checks for safety  Additional comments:  Estimated length of stay: 4-5 days  Discharge Plan:unknown  New goal(s): N/A  Review of initial/current patient goals per problem list:   1.  Goal(s): Eliminate psychosis through the use of medication  Met:  No  Target date:1/13  As evidenced by: Jamesetta So will no longer present with delusional thoughts or paranoia  2.  Goal (s): Identify dispositional plan  Met:  No  Target date:1/13  As evidenced [id number] where she will go at d/c   3.  Goal(s): Identify outpt provider  Met:  No  Target date:1/13  As evidenced [id number] depend on where she goes to live  at d/c  4.  Goal(s):  Met:  No  Target date:  As evidenced by:  Attendees: Patient:     Family:     Physician:  Thedore Mins 10/29/2012 8:13 AM   Nursing:   Waynetta Sandy 10/29/2012 8:13 AM   Clinical Social Worker:  Richelle Ito 10/29/2012 8:13 AM   Extender:  Verne Spurr PA 10/29/2012 8:13 AM   Other:     Other:     Other:       Other:      Scribe for Treatment Team:   Ida Rogue, 10/29/2012 8:13 AM
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
## 2012-10-29 NOTE — Progress Notes (Signed)
D: Patient pleasant and cooperative with staff and peers. Patient's affect/mood is appropriate to circumstance. She reported on self inventory sheet that her sleep is fair, appetite is good, energy level is normal, and ability to pay attention is good. Patient attends group.  A: Support and encouragement provided to patient. Scheduled medications administered per MD orders. Monitor Q15 minute checks for safety.  R: Patient receptive. Denies SI/HI. Patient remains safe on the unit.

## 2012-10-29 NOTE — Progress Notes (Signed)
Pt woke up during a brief period of time to check the time. Writer extended self to pt and pt denied any concerns or requests. Pt went back to bed immediately and was asleep at the next q23min checks. Pt remains safe at this time with q23min checks.

## 2012-10-29 NOTE — Progress Notes (Signed)
Psychoeducational Group Note  Date:  10/29/2012 Time: 1100  Group Topic/Focus:  Personal Choices and Values:   The focus of this group is to help patients assess and explore the importance of values in their lives, how their values affect their decisions, how they express their values and what opposes their expression.  Participation Level:  Active  Participation Quality:  Appropriate, Attentive and Drowsy  Affect:  Appropriate  Cognitive:  Appropriate  Insight:  Engaged  Engagement in Group:  Engaged  Additional Comments:  Tyliah attended and participated during choices and values group. Patient stated what were some negative and positive values and choices that have been made within his life span. Patient was appropriate and shared when asked to speak.   Karleen Hampshire Brittini 10/29/2012, 12:00 PM

## 2012-10-29 NOTE — Clinical Social Work Note (Signed)
[facility] Group Therapy  10/29/2012 , 1:29 PM   Type of Therapy:  [facility] [employer] Presentation  Did not attend   [name] B 10/29/2012 , 1:29 PM   

## 2012-10-29 NOTE — [facility] Suicide Risk Assessment (Signed)
Suicide Risk Assessment  Admission Assessment     Nursing information obtained from:  Patient Demographic factors:  Low socioeconomic status Current Mental Status:  NA Loss Factors:  Financial problems / change in socioeconomic status Historical Factors:  NA Risk Reduction Factors:  Sense of responsibility to family;Positive social support  CLINICAL FACTORS:   Schizophrenia:   Paranoid or undifferentiated type Currently Psychotic  COGNITIVE FEATURES THAT CONTRIBUTE TO RISK:  Closed-mindedness Polarized thinking    SUICIDE RISK:   Minimal: No identifiable suicidal ideation.  Patients presenting with no risk factors but with morbid ruminations; may be classified as minimal risk based on the severity of the depressive symptoms  PLAN OF CARE:1. Admit for crisis management and stabilization. 2. Medication management to reduce current symptoms to base line and improve the patient's overall level of functioning 3. Treat health problems as indicated. 4. Develop treatment plan to decrease risk of relapse upon discharge and the need for readmission. 5. Psycho-social education regarding relapse prevention and self care. 6. Health care follow up as needed for medical problems. 7. Restart home medications where appropriate.     Thedore Mins, MD 10/29/2012, 11:03 AM

## 2012-10-30 DIAGNOSIS — F200 Paranoid schizophrenia: Principal | ICD-10-CM

## 2012-10-30 DIAGNOSIS — Z5900 Homelessness unspecified: Secondary | ICD-10-CM

## 2012-10-30 LAB — HEMOGLOBIN A1C
Hgb A1c MFr Bld: 6.1 % — ABNORMAL HIGH (ref <5.7)
Mean Plasma Glucose: 128 mg/dL — ABNORMAL HIGH (ref <117)

## 2012-10-30 NOTE — Progress Notes (Signed)
Psychoeducational Group Note  Date:  10/30/2012 Time: 0930  Group Topic/Focus:  Building Self Esteem:   The Focus of this group is helping patients become aware of the effects of self-esteem on their lives, the things they and others do that enhance or undermine their self-esteem, seeing the relationship between their level of self-esteem and the choices they make and learning ways to enhance self-esteem.  Participation Level:  Active  Participation Quality:  Appropriate, Attentive and Sharing  Affect:  Appropriate  Cognitive:  Appropriate  Insight:  Engaged  Engagement in Group:  Engaged  Additional Comments:  Shannon [name] attended and was appropriate during group. Patient stated one thing he like about himself and how he could improve his self esteem. During group it was discussed what low and self esteem was and how patient defined self-esteem in own terms.   Shannon [name] 10/30/2012, 11:04 AM

## 2012-10-30 NOTE — Progress Notes (Signed)
Patient ID: Shannon [name], female   DOB: 11/28/1950, 62 y.o.   MRN: [id number] D: No new data from previous assessment. Patient in bed sleeping. Respiration regular and unlabored. No sign of distress noted at this time A: 15 mins checks for  Safety. R: Patient remains asleep. Pt is safe.

## 2012-10-30 NOTE — Progress Notes (Signed)
[facility] MD Progress Note  10/30/2012 4:58 PM Shannon [name]  MRN:  [id number] Subjective:  Diagnosis:  Schizophrenia, paranoid type   ADL's:  Intact  Sleep: Fair  Appetite:  Fair  Suicidal Ideation:  denies Homicidal Ideation:  denies AEB (as evidenced by): patient's affect, response, and participation in unit programming.  Psychiatric Specialty Exam: Review of Systems  Constitutional: Negative.  Negative for fever, chills, weight loss, malaise/fatigue and diaphoresis.  HENT: Negative for congestion and sore throat.   Eyes: Negative for blurred vision, double vision and photophobia.  Respiratory: Negative for cough, shortness of breath and wheezing.   Cardiovascular: Negative for chest pain, palpitations and PND.  Gastrointestinal: Negative for heartburn, nausea, vomiting, abdominal pain, diarrhea and constipation.  Musculoskeletal: Negative for myalgias, joint pain and falls.  Neurological: Negative for dizziness, tingling, tremors, sensory change, speech change, focal weakness, seizures, loss of consciousness, weakness and headaches.  Endo/Heme/Allergies: Negative for polydipsia. Does not bruise/bleed easily.  Psychiatric/Behavioral: Negative for depression, suicidal ideas, hallucinations, memory loss and substance abuse. The patient is not nervous/anxious and does not have insomnia.     Blood pressure 97/59, pulse 68, temperature 97 F (36.1 C), temperature source Oral, resp. rate 20, height 5\' 4"  (1.626 m), weight 58.968 kg (130 lb).Body mass index is 22.31 kg/(m^2).  General Appearance: Casual  Eye Contact::  Fair  Speech:  Clear and Coherent  Volume:  Normal  Mood:  Euthymic  Affect:  Congruent  Thought Process:  Disorganized  Orientation:  Other:  2/3  Thought Content:  Hallucinations: Auditory  Suicidal Thoughts:  No  Homicidal Thoughts:  No  Memory:  Immediate;   Poor  Judgement:  Impaired  Insight:  Lacking  Psychomotor Activity:  Normal  Concentration:  Poor    Recall:  Fair  Akathisia:  No  Handed:    AIMS (if indicated):     Assets:  Communication Skills Resilience  Sleep:  Number of Hours: 4.5    Current Medications: Current Facility-Administered Medications  Medication Dose Route Frequency Provider Last Rate Last Dose  . acetaminophen (TYLENOL) tablet 650 mg  650 mg Oral Q6H PRN Shuvon Rankin, NP      . alum & mag hydroxide-simeth (MAALOX/MYLANTA) 200-200-20 MG/5ML suspension 30 mL  30 mL Oral Q4H PRN Shuvon Rankin, NP      . amLODipine (NORVASC) tablet 5 mg  5 mg Oral Daily Verne Spurr, PA-C   5 mg at 10/30/12 0751  . aspirin EC tablet 81 mg  81 mg Oral Daily Verne Spurr, PA-C   81 mg at 10/30/12 0747  . benztropine (COGENTIN) tablet 1 mg  1 mg Oral BID Verne Spurr, PA-C   1 mg at 10/30/12 0747  . hydrochlorothiazide (MICROZIDE) capsule 12.5 mg  12.5 mg Oral Daily Verne Spurr, PA-C   12.5 mg at 10/30/12 0751  . irbesartan (AVAPRO) tablet 150 mg  150 mg Oral Daily Verne Spurr, PA-C   150 mg at 10/30/12 0915  . magnesium hydroxide (MILK OF MAGNESIA) suspension 30 mL  30 mL Oral Daily PRN Shuvon Rankin, NP      . pantoprazole (PROTONIX) EC tablet 40 mg  40 mg Oral Daily Verne Spurr, PA-C   40 mg at 10/30/12 0745  . perphenazine (TRILAFON) tablet 8 mg  8 mg Oral BID Verne Spurr, PA-C   8 mg at 10/30/12 0748  . pioglitazone (ACTOS) tablet 15 mg  15 mg Oral Daily Verne Spurr, PA-C   15 mg at 10/30/12 0748  . simvastatin (  ZOCOR) tablet 20 mg  20 mg Oral q1800 Verne Spurr, PA-C   20 mg at 10/29/12 1723  . traZODone (DESYREL) tablet 150 mg  150 mg Oral QHS Verne Spurr, PA-C   150 mg at 10/29/12 2132    Lab Results:  Results for orders placed during the hospital encounter of 10/28/12 (from the past 48 hour(s))  GLUCOSE, CAPILLARY     Status: Abnormal   Collection Time   10/28/12  8:02 PM      Component Value Range Comment   Glucose-Capillary 128 (*) 70 - 99 mg/dL    Comment 1 Notify RN     HEMOGLOBIN A1C     Status: Abnormal    Collection Time   10/30/12  6:10 AM      Component Value Range Comment   Hemoglobin A1C 6.1 (*) <5.7 %    Mean Plasma Glucose 128 (*) <117 mg/dL     Physical Findings: AIMS: Facial and Oral Movements Muscles of Facial Expression: None, normal Lips and Perioral Area: None, normal Jaw: None, normal Tongue: None, normal,Extremity Movements Upper (arms, wrists, hands, fingers): None, normal Lower (legs, knees, ankles, toes): None, normal, Trunk Movements Neck, shoulders, hips: None, normal, Overall Severity Severity of abnormal movements (highest score from questions above): None, normal Incapacitation due to abnormal movements: None, normal Patient's awareness of abnormal movements (rate only patient's report): No Awareness, Dental Status Current problems with teeth and/or dentures?: No Does patient usually wear dentures?: Yes  CIWA:    COWS:     Treatment Plan Summary: Daily contact with patient to assess and evaluate symptoms and progress in treatment Medication management  Plan: 1. Continue current plan of care with no changes at this time.  Medical Decision Making Problem Points:  Established problem, stable/improving (1) Data Points:  Review or order clinical lab tests (1) Review of medication regiment & side effects (2)  I certify that inpatient services furnished can reasonably be expected to improve the patient's condition.  [name]. Mahari Strahm PAC 10/30/2012, 4:58 PM
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
## 2012-10-30 NOTE — Progress Notes (Signed)
D: Patient appropriate and cooperative with staff. Patient's affect/mood is anxious. She reported on self inventory sheet that she is sleeping well, appetite is good, energy level is normal and ability to pay attention is good. Patient rated depression and feelings of hopelessness "1". She reported that changes she plans to take to better care for self are rest, exercise, eat healthy and manage income better.  A: Support and encouragement provided to patient. Scheduled medications administered per ordering MD. Monitor Q15 minute checks for safety.  R: Patient receptive. Denies SI/HI. Patient remains safe.

## 2012-10-30 NOTE — Progress Notes (Signed)
Patient ID: Shannon [name], female   DOB: 12-26-1950, 62 y.o.   MRN: [id number] D:  Pt mood is anxious and affect is flat. Pt c/o not sleeping well with 150 mg of trazodone. Pt attended [employer] and interacted appropriately with peers. Pt denies SI/HI/AVH and pain. Cooperative with assessment. No acute distressed noted.   A:  Medications administered as prescribed. Writer encouraged pt to discuss feelings. Pt encouraged to come to staff with any question or concerns.  Q15 minutes observation for safety.   R: Patient remains safe. She is complaint with medications and group programming. Continue current POC.

## 2012-10-30 NOTE — Progress Notes (Signed)
[facility] LCSW Aftercare Discharge Planning Group Note  10/30/2012 11:49 AM  Participation Quality:  Attentive  Affect:  Flat  Cognitive:  Oriented  Insight:  Limited  Engagement in Group:  Engaged  Modes of Intervention:  Discussion, Exploration and Socialization  Summary of Progress/Problems:Shannon [name] got revved up a couple of times during group responding to other patients who were intrusive and loud, but was redirectable.  She asked for help getting into an apartment from here.  Told her I was unable to do so.  Offered her a bed at [facility].  She gladly accepted it.  Daryel Gerald B 10/30/2012, 11:49 AM

## 2012-10-31 NOTE — Progress Notes (Signed)
Patient ID: RALYNN SAN, female   DOB: 12/19/1950, 62 y.o.   MRN: [id number] [facility] MD Progress Note  10/31/2012 1:10 PM Shannon [name]  MRN:  [id number] Subjective: "I didn't sleep good."  Diagnosis:  Schizophrenia, paranoid type ADL's:  Intact  Sleep: poor  Appetite:  Fair  Suicidal Ideation:  denies Homicidal Ideation:  denies AEB (as evidenced by): patient's affect, response, and participation in unit programming.  Psychiatric Specialty Exam: Review of Systems  Constitutional: Negative.  Negative for fever, chills, weight loss, malaise/fatigue and diaphoresis.  HENT: Negative for congestion and sore throat.   Eyes: Negative for blurred vision, double vision and photophobia.  Respiratory: Negative for cough, shortness of breath and wheezing.   Cardiovascular: Negative for chest pain, palpitations and PND.  Gastrointestinal: Negative for heartburn, nausea, vomiting, abdominal pain, diarrhea and constipation.  Musculoskeletal: Negative for myalgias, joint pain and falls.  Skin: Rash: Patient reports a boil on her buttock.  Neurological: Negative for dizziness, tingling, tremors, sensory change, speech change, focal weakness, seizures, loss of consciousness, weakness and headaches.  Endo/Heme/Allergies: Negative for polydipsia. Does not bruise/bleed easily.  Psychiatric/Behavioral: Negative for depression, suicidal ideas, hallucinations, memory loss and substance abuse. The patient is not nervous/anxious and does not have insomnia.     Blood pressure 119/69, pulse 64, temperature 97.8 F (36.6 C), temperature source Oral, resp. rate 16, height 5\' 4"  (1.626 m), weight 58.968 kg (130 lb).Body mass index is 22.31 kg/(m^2).  General Appearance: Casual  Eye Contact::  Fair  Speech:  Clear and Coherent  Volume:  Normal  Mood:  Euthymic  Affect:  Congruent  Thought Process:  Disorganized  Orientation:  Other:  2/3  Thought Content:  Hallucinations: Auditory  Suicidal Thoughts:  No    Homicidal Thoughts:  No  Memory:  Immediate;   Poor  Judgement:  Impaired  Insight:  Lacking  Psychomotor Activity:  Normal  Concentration:  Poor  Recall:  Fair  Akathisia:  No  Handed:    AIMS (if indicated):     Assets:  Communication Skills Resilience  Sleep:  Number of Hours: 3.5    Current Medications: Current Facility-Administered Medications  Medication Dose Route Frequency Provider Last Rate Last Dose  . acetaminophen (TYLENOL) tablet 650 mg  650 mg Oral Q6H PRN Shuvon Rankin, NP      . alum & mag hydroxide-simeth (MAALOX/MYLANTA) 200-200-20 MG/5ML suspension 30 mL  30 mL Oral Q4H PRN Shuvon Rankin, NP      . amLODipine (NORVASC) tablet 5 mg  5 mg Oral Daily Verne Spurr, PA-C   5 mg at 10/31/12 0814  . aspirin EC tablet 81 mg  81 mg Oral Daily Verne Spurr, PA-C   81 mg at 10/31/12 0813  . benztropine (COGENTIN) tablet 1 mg  1 mg Oral BID Verne Spurr, PA-C   1 mg at 10/31/12 0815  . hydrochlorothiazide (MICROZIDE) capsule 12.5 mg  12.5 mg Oral Daily Verne Spurr, PA-C   12.5 mg at 10/31/12 0816  . irbesartan (AVAPRO) tablet 150 mg  150 mg Oral Daily Verne Spurr, PA-C   150 mg at 10/31/12 0815  . magnesium hydroxide (MILK OF MAGNESIA) suspension 30 mL  30 mL Oral Daily PRN Shuvon Rankin, NP      . pantoprazole (PROTONIX) EC tablet 40 mg  40 mg Oral Daily Verne Spurr, PA-C   40 mg at 10/31/12 0813  . perphenazine (TRILAFON) tablet 8 mg  8 mg Oral BID Verne Spurr, PA-C   8 mg  at 10/31/12 0814  . pioglitazone (ACTOS) tablet 15 mg  15 mg Oral Daily Verne Spurr, PA-C   15 mg at 10/31/12 0813  . simvastatin (ZOCOR) tablet 20 mg  20 mg Oral q1800 Verne Spurr, PA-C   20 mg at 10/30/12 1813  . traZODone (DESYREL) tablet 150 mg  150 mg Oral QHS Verne Spurr, PA-C   150 mg at 10/30/12 2146    Lab Results:  Results for orders placed during the hospital encounter of 10/28/12 (from the past 48 hour(s))  HEMOGLOBIN A1C     Status: Abnormal   Collection Time   10/30/12   6:10 AM      Component Value Range Comment   Hemoglobin A1C 6.1 (*) <5.7 %    Mean Plasma Glucose 128 (*) <117 mg/dL     Physical Findings: Visual exam of the patient's left buttock reveals a 3mm nevi vs well healed scar.There is no erythema, discharge, swelling or drainage.  AIMS: Facial and Oral Movements Muscles of Facial Expression: None, normal Lips and Perioral Area: None, normal Jaw: None, normal Tongue: None, normal,Extremity Movements Upper (arms, wrists, hands, fingers): None, normal Lower (legs, knees, ankles, toes): None, normal, Trunk Movements Neck, shoulders, hips: None, normal, Overall Severity Severity of abnormal movements (highest score from questions above): None, normal Incapacitation due to abnormal movements: None, normal Patient's awareness of abnormal movements (rate only patient's report): No Awareness, Dental Status Current problems with teeth and/or dentures?: No Does patient usually wear dentures?: Yes  CIWA:    COWS:     Treatment Plan Summary: Daily contact with patient to assess and evaluate symptoms and progress in treatment Medication management  Plan: 1. Continue current plan of care with no changes at this time. 2. Reassurance regarding lesion. 3. If patient continues to improve, ELOS is Monday. Medical Decision Making Problem Points:  Established problem, stable/improving (1) Data Points:  Review or order clinical lab tests (1) Review of medication regiment & side effects (2)  I certify that inpatient services furnished can reasonably be expected to improve the patient's condition.  [name]. Bogdan Vivona PAC 10/31/2012, 1:10 PM

## 2012-10-31 NOTE — Progress Notes (Signed)
Patient ID: Shannon [name], female   DOB: 1951-07-02, 62 y.o.   MRN: [id number] PER STATE REGULATIONS 482.30  THIS CHART WAS REVIEWED FOR MEDICAL NECESSITY WITH RESPECT TO THE PATIENT'S ADMISSION/ DURATION OF STAY.  NEXT REVIEW DATE: 11/03/2012  Willa Rough, RN, BSN CASE MANAGER

## 2012-10-31 NOTE — Progress Notes (Signed)
D: Patient pleasant and cooperative with staff and peers. Patient's mood is appropriate to circumstance. She reported on self inventory sheet that her sleep is fair, appetite is good, energy level is normal and ability to pay attention is good. Patient rated depression and feelings of hopelessness "1". Patient attended group.  A: Support and encouragement provided to patient. Administered scheduled medications per ordering MD. Monitor Q15 minute checks for safety.  R: Patient receptive. Denies SI/HI/AVH. Patient remains safe on the unit.

## 2012-10-31 NOTE — Progress Notes (Signed)
Psychoeducational Group Note  Date:  10/31/2012 Time:  0930  Group Topic/Focus:  Therapeutic Activity  Participation Level:  Active  Participation Quality:  Appropriate and Attentive  Affect:  Appropriate  Cognitive:  Appropriate  Insight:  Engaged  Engagement in Group:  Engaged  Additional Comments: Caylah attended and participated during group. The group consisted of using the question ball for a therapeutic activity, where the patient would answer a question on the ball and then pass to a fellow group member. Patient was cooperative and appropriate during the group.   Karleen Hampshire Brittini 10/31/2012, 11:33 AM

## 2012-10-31 NOTE — Progress Notes (Signed)
Psychoeducational Group Note  Date:  10/31/2012 Time:  2000  Group Topic/Focus:  Wrap-Up Group:   The focus of this group is to help patients review their daily goal of treatment and discuss progress on daily workbooks.  Participation Level:  Active  Participation Quality:  Appropriate  Affect:  Appropriate  Cognitive:  Appropriate  Insight:  Limited  Engagement in Group:  Limited  Additional Comments:    Kaytlen Lightsey A 10/31/2012, 10:37 PM

## 2012-10-31 NOTE — Progress Notes (Signed)
Patient ID: Shannon [name], female   DOB: Nov 16, 1950, 62 y.o.   MRN: [id number] D: Patient pleasant with staff and peers. Pt presented with anxious mood and flat affect. Pt attended evening wrap up group. Pt slept during group no interaction with peers.  Pt denies SI/HI/AVH and pain. Cooperative with assessment. No acute distressed noted.   A: Medications administered as prescribed. Writer encouraged pt to discuss feelings. Pt encouraged to come to staff with any question or concerns. Q15 minutes observation for safety.   R: Patient remains safe. She is complaint with medications and group programming. Continue current POC.

## 2012-10-31 NOTE — Progress Notes (Signed)
[facility] LCSW Aftercare Discharge Planning Group Note  10/31/2012 2:44 PM  Participation Quality:  Attentive  Affect:  Appropriate  Cognitive:  Oriented  Insight:  Improving  Engagement in Group:  Engaged  Modes of Intervention:  Discussion, Exploration and Socialization  Summary of Progress/Problems: Ananda reiterated her desire to go to the shelter.  States she has income, and has access to her money now, but does not want to spend it on rent, but other things.  "I just got a big bottle of perfume.  What would I do alone in a big old apartment?"  She had already called this AM but was told that they have no beds in [facility].  No complaints this AM.  Appears to be asymptomatic.  Daryel Gerald B 10/31/2012, 2:44 PM

## 2012-11-01 NOTE — Progress Notes (Signed)
[facility] Group Notes:  (Counselor/Nursing/MHT/Case Management/Adjunct)  11/01/2012 10:42 AM  Type of Therapy:  self inventory  Participation Level:  Active  Participation Quality:  Appropriate  Affect:  Appropriate  Cognitive:  Alert  Insight:  Engaged  Engagement in Group:  Engaged  Engagement in Therapy:  Engaged  Modes of Intervention:  Discussion  Summary of Progress/Problems:   Andrena Mews 11/01/2012, 10:42 AM

## 2012-11-01 NOTE — Progress Notes (Signed)
D: Pt denies SI/HI/AV. Pt is pleasant and cooperative. Pt rates depression at a 0,  and Helplessness/hopelessness at a 0. Pt states she is feeling good and is ready to go home.   A: Pt was offered support and encouragement. Pt was given scheduled medications. Pt was encourage to attend groups. Q 15 minute checks were done for safety.  R:Pt attends groups and interacts well with peers and staff. Pt  taking medication. Pt has no complaints.Pt receptive to treatment and safety maintained on unit.

## 2012-11-01 NOTE — Clinical Social Work Psychosocial (Signed)
[facility] Group Notes:  (Clinical Social Work)  11/01/2012  11:15-11:45AM  Summary of Progress/Problems:   The main focus of today's process group was for the patient to identify ways in which they have in the past sabotaged their own recovery and reasons they may have done this/what they received from doing it.  We then worked to identify a specific plan to avoid doing this when discharged from the hospital for this admission.  The patient expressed very little, as she was in and out of the room numerous times.  She was pleasant but inattentive.  At one point, while another patient was speaking, she got up and organized all the books and pencils on a table, regardless of being asked to sit down.  Type of Therapy:  Group Therapy - Process  Participation Level:  Minimal  Participation Quality:  Intrusive  Affect:  Blunted  Cognitive:  Disorganized  Insight:  Distracting  Engagement in Therapy:  Distracting  Modes of Intervention:  Clarification, Education, Limit-setting, Problem-solving, Socialization, Support and Processing, Exploration, Discussion   Ambrose Mantle, LCSW 11/01/2012, 1:01 PM

## 2012-11-01 NOTE — Progress Notes (Signed)
Patient ID: Shannon [name], female   DOB: September 12, 1951, 62 y.o.   MRN: [id number] [facility] MD Progress Note  11/01/2012 4:33 PM Shannon [name]  MRN:  [id number] Subjective: "I'm ok today, ready to go home on Monday."   Diagnosis:  Schizophrenia, paranoid type ADL's:  Intact  Sleep: well Appetite:  Fair  Suicidal Ideation:  denies Homicidal Ideation:  denies AEB (as evidenced by): patient's affect, response, and participation in unit programming.  Psychiatric Specialty Exam: Review of Systems  Constitutional: Negative.  Negative for fever, chills, weight loss, malaise/fatigue and diaphoresis.  HENT: Negative for congestion and sore throat.   Eyes: Negative for blurred vision, double vision and photophobia.  Respiratory: Negative for cough, shortness of breath and wheezing.   Cardiovascular: Negative for chest pain, palpitations and PND.  Gastrointestinal: Negative for heartburn, nausea, vomiting, abdominal pain, diarrhea and constipation.  Musculoskeletal: Negative for myalgias, joint pain and falls.  Neurological: Negative for dizziness, tingling, tremors, sensory change, speech change, focal weakness, seizures, loss of consciousness, weakness and headaches.  Endo/Heme/Allergies: Negative for polydipsia. Does not bruise/bleed easily.  Psychiatric/Behavioral: Negative for depression, suicidal ideas, hallucinations, memory loss and substance abuse. The patient is not nervous/anxious and does not have insomnia.     Blood pressure 109/72, pulse 76, temperature 97.3 F (36.3 C), temperature source Oral, resp. rate 16, height 5\' 4"  (1.626 m), weight 58.968 kg (130 lb).Body mass index is 22.31 kg/(m^2).  General Appearance: Casual  Eye Contact::  Fair  Speech:  Clear and Coherent still rambles a bit  Volume:  Normal  Mood:  Euthymic  Affect:  Congruent  Thought Process:  Disorganized  Orientation:  Other:  2/3  Thought Content:  Hallucinations: Auditory  Suicidal Thoughts:  No  Homicidal  Thoughts:  No  Memory:  Immediate;   Poor  Judgement:  Impaired  Insight:  Lacking  Psychomotor Activity:  Normal  Concentration:  Poor  Recall:  Fair  Akathisia:  No  Handed:    AIMS (if indicated):     Assets:  Communication Skills Resilience  Sleep:  Number of Hours: 6.75    Current Medications: Current Facility-Administered Medications  Medication Dose Route Frequency Provider Last Rate Last Dose  . acetaminophen (TYLENOL) tablet 650 mg  650 mg Oral Q6H PRN Shuvon Rankin, NP      . alum & mag hydroxide-simeth (MAALOX/MYLANTA) 200-200-20 MG/5ML suspension 30 mL  30 mL Oral Q4H PRN Shuvon Rankin, NP      . amLODipine (NORVASC) tablet 5 mg  5 mg Oral Daily Verne Spurr, PA-C   5 mg at 11/01/12 0758  . aspirin EC tablet 81 mg  81 mg Oral Daily Verne Spurr, PA-C   81 mg at 11/01/12 0757  . benztropine (COGENTIN) tablet 1 mg  1 mg Oral BID Verne Spurr, PA-C   1 mg at 11/01/12 0758  . hydrochlorothiazide (MICROZIDE) capsule 12.5 mg  12.5 mg Oral Daily Verne Spurr, PA-C   12.5 mg at 11/01/12 0757  . irbesartan (AVAPRO) tablet 150 mg  150 mg Oral Daily Verne Spurr, PA-C   150 mg at 11/01/12 0758  . magnesium hydroxide (MILK OF MAGNESIA) suspension 30 mL  30 mL Oral Daily PRN Shuvon Rankin, NP      . pantoprazole (PROTONIX) EC tablet 40 mg  40 mg Oral Daily Verne Spurr, PA-C   40 mg at 11/01/12 0758  . perphenazine (TRILAFON) tablet 8 mg  8 mg Oral BID Verne Spurr, PA-C   8 mg at 11/01/12  1610  . pioglitazone (ACTOS) tablet 15 mg  15 mg Oral Daily Verne Spurr, PA-C   15 mg at 11/01/12 0757  . simvastatin (ZOCOR) tablet 20 mg  20 mg Oral q1800 Verne Spurr, PA-C   20 mg at 10/31/12 1710  . traZODone (DESYREL) tablet 150 mg  150 mg Oral QHS Verne Spurr, PA-C   150 mg at 10/31/12 2238    Lab Results:  No results found for this or any previous visit (from the past 48 hour(s)).  Physical Findings: AIMS: Facial and Oral Movements Muscles of Facial Expression: None,  normal Lips and Perioral Area: None, normal Jaw: None, normal Tongue: None, normal,Extremity Movements Upper (arms, wrists, hands, fingers): None, normal Lower (legs, knees, ankles, toes): None, normal, Trunk Movements Neck, shoulders, hips: None, normal, Overall Severity Severity of abnormal movements (highest score from questions above): None, normal Incapacitation due to abnormal movements: None, normal Patient's awareness of abnormal movements (rate only patient's report): No Awareness, Dental Status Current problems with teeth and/or dentures?: No Does patient usually wear dentures?: Yes  CIWA:    COWS:     Treatment Plan Summary: Daily contact with patient to assess and evaluate symptoms and progress in treatment Medication management  Plan: 1. Continue current plan of care with no changes at this time. 2. If patient continues to improve, ELOS is Monday. Medical Decision Making Problem Points:  Established problem, stable/improving (1) Data Points:  Review or order clinical lab tests (1) Review of medication regiment & side effects (2)  I certify that inpatient services furnished can reasonably be expected to improve the patient's condition.  [name]. Massimo Hartland PAC 11/01/2012, 4:33 PM

## 2012-11-01 NOTE — Progress Notes (Signed)
Psychoeducational Group Note  Date:  11/01/2012 Time:  2000  Group Topic/Focus:  Wrap-Up Group:   The focus of this group is to help patients review their daily goal of treatment and discuss progress on daily workbooks.  Participation Level:  Active  Participation Quality:  Intrusive  Affect:  Labile  Cognitive:  Disorganized  Insight:  Limited  Engagement in Group:  Limited  Additional Comments:  Patient attended and participated in group tonight. She reports having a good day. She showered, had her vitals taken, take her medications, attended her groups and she had a 1:1 with a Comptroller. Patient advised that she is leaving Monday.   Lita Mains [facility] 11/01/2012, 10:13 PM

## 2012-11-02 LAB — GLUCOSE, CAPILLARY: Glucose-Capillary: 88 mg/dL (ref 70–99)

## 2012-11-02 NOTE — Progress Notes (Signed)
Patient ID: Shannon [name], female   DOB: 10/03/1951, 62 y.o.   MRN: [id number] [name] MD Progress Note  11/02/2012 10:25 AM Shannon [name]  MRN:  [id number] Subjective: "They won't let me wash my clothes." There was an issue with her disturbing her roommate and having her roommate's clothes in her bags.  Simcha says the roommate put them in there.  Diagnosis:  Schizophrenia, paranoid type ADL's:  Intact  Sleep: well Appetite:  Fair  Suicidal Ideation:  denies Homicidal Ideation:  denies AEB (as evidenced by): patient's affect, response, and participation in unit programming.  Psychiatric Specialty Exam: Review of Systems  Constitutional: Negative.  Negative for fever, chills, weight loss, malaise/fatigue and diaphoresis.  HENT: Negative for congestion and sore throat.   Eyes: Negative for blurred vision, double vision and photophobia.  Respiratory: Negative for cough, shortness of breath and wheezing.   Cardiovascular: Negative for chest pain, palpitations and PND.  Gastrointestinal: Negative for heartburn, nausea, vomiting, abdominal pain, diarrhea and constipation.  Musculoskeletal: Negative for myalgias, joint pain and falls.  Neurological: Negative for dizziness, tingling, tremors, sensory change, speech change, focal weakness, seizures, loss of consciousness, weakness and headaches.  Endo/Heme/Allergies: Negative for polydipsia. Does not bruise/bleed easily.  Psychiatric/Behavioral: Negative for depression, suicidal ideas, hallucinations, memory loss and substance abuse. The patient is not nervous/anxious and does not have insomnia.     Blood pressure 107/64, pulse 70, temperature 97.6 F (36.4 C), temperature source Oral, resp. rate 20, height 5\' 4"  (1.626 m), weight 58.968 kg (130 lb).Body mass index is 22.31 kg/(m^2).  General Appearance: Casual  Eye Contact::  Fair  Speech:  Rambles today  Volume:  Normal  Mood:  Euthymic  Affect:  Congruent  Thought Process:   Disorganized  Orientation:  Other:  2/3  Thought Content:  Hallucinations: Auditory  Suicidal Thoughts:  No  Homicidal Thoughts:  No  Memory:  Immediate;   Poor  Judgement:  Impaired  Insight:  Lacking  Psychomotor Activity:  Normal  Concentration:  Poor  Recall:  Fair  Akathisia:  No  Handed:    AIMS (if indicated):     Assets:  Communication Skills Resilience  Sleep:  Number of Hours: 4    Current Medications: Current Facility-Administered Medications  Medication Dose Route Frequency Provider Last Rate Last Dose  . acetaminophen (TYLENOL) tablet 650 mg  650 mg Oral Q6H PRN Shuvon Rankin, NP      . alum & mag hydroxide-simeth (MAALOX/MYLANTA) 200-200-20 MG/5ML suspension 30 mL  30 mL Oral Q4H PRN Shuvon Rankin, NP      . amLODipine (NORVASC) tablet 5 mg  5 mg Oral Daily Verne Spurr, PA-C   5 mg at 11/01/12 0758  . aspirin EC tablet 81 mg  81 mg Oral Daily Verne Spurr, PA-C   81 mg at 11/01/12 0757  . benztropine (COGENTIN) tablet 1 mg  1 mg Oral BID Verne Spurr, PA-C   1 mg at 11/01/12 0758  . hydrochlorothiazide (MICROZIDE) capsule 12.5 mg  12.5 mg Oral Daily Verne Spurr, PA-C   12.5 mg at 11/01/12 0757  . irbesartan (AVAPRO) tablet 150 mg  150 mg Oral Daily Verne Spurr, PA-C   150 mg at 11/01/12 0758  . magnesium hydroxide (MILK OF MAGNESIA) suspension 30 mL  30 mL Oral Daily PRN Shuvon Rankin, NP      . pantoprazole (PROTONIX) EC tablet 40 mg  40 mg Oral Daily Verne Spurr, PA-C   40 mg at 11/01/12 0758  . perphenazine (  TRILAFON) tablet 8 mg  8 mg Oral BID Verne Spurr, PA-C   8 mg at 11/01/12 0758  . pioglitazone (ACTOS) tablet 15 mg  15 mg Oral Daily Verne Spurr, PA-C   15 mg at 11/01/12 0757  . simvastatin (ZOCOR) tablet 20 mg  20 mg Oral q1800 Verne Spurr, PA-C   20 mg at 10/31/12 1710  . traZODone (DESYREL) tablet 150 mg  150 mg Oral QHS Verne Spurr, PA-C   150 mg at 10/31/12 2238    Lab Results:  No results found for this or any previous visit (from  the past 48 hour(s)).  Physical Findings: AIMS: Facial and Oral Movements Muscles of Facial Expression: None, normal Lips and Perioral Area: None, normal Jaw: None, normal Tongue: None, normal,Extremity Movements Upper (arms, wrists, hands, fingers): None, normal Lower (legs, knees, ankles, toes): None, normal, Trunk Movements Neck, shoulders, hips: None, normal, Overall Severity Severity of abnormal movements (highest score from questions above): None, normal Incapacitation due to abnormal movements: None, normal Patient's awareness of abnormal movements (rate only patient's report): No Awareness, Dental Status Current problems with teeth and/or dentures?: No Does patient usually wear dentures?: Yes  CIWA:    COWS:     Treatment Plan Summary: Daily contact with patient to assess and evaluate symptoms and progress in treatment Medication management  Plan: 1. Continue current plan of care with no changes at this time. 2. If patient continues to improve, ELOS is Monday. 3. Poor sleep will monitor. Medical Decision Making Problem Points:  Established problem, stable/improving (1) Data Points:  Review or order clinical lab tests (1) Review of medication regiment & side effects (2)  I certify that inpatient services furnished can reasonably be expected to improve the patient's condition.  [name]. Prerna Harold PAC 11/02/2012, 10:25 AM

## 2012-11-02 NOTE — Progress Notes (Signed)
Patient ID: MARABELLA POPIEL, female   DOB: [birth year], 62 y.o.   MRN: [id number]  D: Pt denies SI/HI/AVH. Pt is pleasant and cooperative. Pt took shower before bed, but was disturbing roommate. A: Pt was offered support and encouragement. Pt was given scheduled medications. Pt was encourage to attend groups. Q 15 minute checks were done for safety. Pt informed of behavior and how it was keeping roommate awake.  R:Pt attends groups and interacts well with peers and staff. Pt is taking medication. Pt has no complaints.Pt receptive to treatment and safety maintained on unit. Pt went to sleep with no problems.

## 2012-11-02 NOTE — Progress Notes (Signed)
D   Pt is disorganized and intrusive at times  She is easily redirected   She has difficulty staying focused and is moving around a lot   She attends groups but has difficulty staying on topic  She is pleasant and interacts well with other pts A   Verbal support given  Medications administered and effectiveness monitored   Q 15 min checks  Redirect as needed R   Pt safe at present

## 2012-11-02 NOTE — Clinical Social Work Psychosocial (Signed)
[facility] Group Notes:  (Clinical Social Work)  11/02/2012   11:15-11:45AM  Summary of Progress/Problems:  The main focus of today's process group was to listen to a variety of genres of music and to identify that different types of music provoke different responses.  The patient then was able to identify personally what was soothing for them, as well as energizing.  Examples were given of how to use this knowledge in sleep habits, with depression, and with other symptoms.  The patient expressed enjoyment of the music, but did not grasp the concept of using as a symptom manager.  Type of Therapy:  Music Therapy with processing done  Participation Level:  Active  Participation Quality:  Attentive, Intrusive and Redirectable  Affect:  Blunted and Excited  Cognitive:  Disorganized  Insight:  Limited  Engagement in Therapy:  Engaged  Modes of Intervention:   Socialization, Support and Processing, Exploration, Education, Rapport Building   [employer], LCSW 11/02/2012, 11:40 AM

## 2012-11-02 NOTE — Progress Notes (Signed)
Writer entered patients room and observed her lying in bed resting. Patient was easily aroused when her name was called.Writer inquired as to how she felt and she reported feeling fine and she was going to get up soon. Patient reports that she is hoping to leave on tomorrow. Patient reported that she had been up earlier and came to her room to get a little rest. Patient currently denies si/hi/a/v hallucinations. Support and encouragement offered, safety maintained on unit, will continue to monitor. Writer observed patient getting out of bed and preparing to go to the dayroom before group.

## 2012-11-02 NOTE — Progress Notes (Signed)
Psychoeducational Group Note  Date:  11/02/2012 Time:  0945 am  Group Topic/Focus:  Making Healthy Choices:   The focus of this group is to help patients identify negative/unhealthy choices they were using prior to admission and identify positive/healthier coping strategies to replace them upon discharge.  Participation Level:  Minimal  Participation Quality:  Inattentive  Affect:  Blunted  Cognitive:  Disorganized  Insight:  Lacking  Engagement in Group:  Limited  Additional Comments:    Andrena Mews 11/02/2012, 10:29 AM

## 2012-11-03 LAB — GLUCOSE, CAPILLARY
Glucose-Capillary: 102 mg/dL — ABNORMAL HIGH (ref 70–99)
Glucose-Capillary: 72 mg/dL (ref 70–99)
Glucose-Capillary: 86 mg/dL (ref 70–99)

## 2012-11-03 NOTE — Progress Notes (Signed)
Patient ID: Shannon [name], female   DOB: 08-Jun-1951, 62 y.o.   MRN: [id number] [facility] MD Progress Note  11/03/2012 1:10 PM Shannon [name]  MRN:  [id number] Subjective: "I'm going to the Motel, I've got money." Patient is anxious to discharge out.  Diagnosis:  Schizophrenia, paranoid type ADL's:  Intact  Sleep: well Appetite:  Fair  Suicidal Ideation:  denies Homicidal Ideation:  denies AEB (as evidenced by): patient's affect, response, and participation in unit programming.  Psychiatric Specialty Exam: Review of Systems  Constitutional: Negative.  Negative for fever, chills, weight loss, malaise/fatigue and diaphoresis.  HENT: Negative for congestion and sore throat.   Eyes: Negative for blurred vision, double vision and photophobia.  Respiratory: Negative for cough, shortness of breath and wheezing.   Cardiovascular: Negative for chest pain, palpitations and PND.  Gastrointestinal: Negative for heartburn, nausea, vomiting, abdominal pain, diarrhea and constipation.  Musculoskeletal: Negative for myalgias, joint pain and falls.  Neurological: Negative for dizziness, tingling, tremors, sensory change, speech change, focal weakness, seizures, loss of consciousness, weakness and headaches.  Endo/Heme/Allergies: Negative for polydipsia. Does not bruise/bleed easily.  Psychiatric/Behavioral: Negative for depression, suicidal ideas, hallucinations, memory loss and substance abuse. The patient is not nervous/anxious and does not have insomnia.     Blood pressure 117/71, pulse 63, temperature 97.5 F (36.4 C), temperature source Oral, resp. rate 20, height 5\' 4"  (1.626 m), weight 58.968 kg (130 lb).Body mass index is 22.31 kg/(m^2).  General Appearance: Casual  Eye Contact::  Fair  Speech:  Clearing and goal directed  Volume:  Normal  Mood:  Euthymic  Affect:  Congruent  Thought Process:  Denies AVH  Orientation:  Full (Time, Place, and Person)  Thought Content:  With in normal limits    Suicidal Thoughts:  No  Homicidal Thoughts:  No  Memory:  Immediate;   Poor  Judgement:  Fair  Insight:  Fair  Psychomotor Activity:  Normal  Concentration:  Poor  Recall:  Fair  Akathisia:  No  Handed:    AIMS (if indicated):     Assets:  Communication Skills Resilience  Sleep:  Number of Hours: 6.25    Current Medications: Current Facility-Administered Medications  Medication Dose Route Frequency Provider Last Rate Last Dose  . acetaminophen (TYLENOL) tablet 650 mg  650 mg Oral Q6H PRN Shuvon Rankin, NP      . alum & mag hydroxide-simeth (MAALOX/MYLANTA) 200-200-20 MG/5ML suspension 30 mL  30 mL Oral Q4H PRN Shuvon Rankin, NP      . amLODipine (NORVASC) tablet 5 mg  5 mg Oral Daily Verne Spurr, PA-C   5 mg at 11/01/12 0758  . aspirin EC tablet 81 mg  81 mg Oral Daily Verne Spurr, PA-C   81 mg at 11/01/12 0757  . benztropine (COGENTIN) tablet 1 mg  1 mg Oral BID Verne Spurr, PA-C   1 mg at 11/01/12 0758  . hydrochlorothiazide (MICROZIDE) capsule 12.5 mg  12.5 mg Oral Daily Verne Spurr, PA-C   12.5 mg at 11/01/12 0757  . irbesartan (AVAPRO) tablet 150 mg  150 mg Oral Daily Verne Spurr, PA-C   150 mg at 11/01/12 0758  . magnesium hydroxide (MILK OF MAGNESIA) suspension 30 mL  30 mL Oral Daily PRN Shuvon Rankin, NP      . pantoprazole (PROTONIX) EC tablet 40 mg  40 mg Oral Daily Verne Spurr, PA-C   40 mg at 11/01/12 0758  . perphenazine (TRILAFON) tablet 8 mg  8 mg Oral BID Verne Spurr,  PA-C   8 mg at 11/01/12 0758  . pioglitazone (ACTOS) tablet 15 mg  15 mg Oral Daily Verne Spurr, PA-C   15 mg at 11/01/12 0757  . simvastatin (ZOCOR) tablet 20 mg  20 mg Oral q1800 Verne Spurr, PA-C   20 mg at 10/31/12 1710  . traZODone (DESYREL) tablet 150 mg  150 mg Oral QHS Verne Spurr, PA-C   150 mg at 10/31/12 2238    Lab Results:  Results for orders placed during the hospital encounter of 10/28/12 (from the past 48 hour(s))  GLUCOSE, CAPILLARY     Status: Normal    Collection Time   11/02/12  5:15 PM      Component Value Range Comment   Glucose-Capillary 88  70 - 99 mg/dL   GLUCOSE, CAPILLARY     Status: Abnormal   Collection Time   11/03/12  6:27 AM      Component Value Range Comment   Glucose-Capillary 102 (*) 70 - 99 mg/dL    Comment 1 Notify RN     GLUCOSE, CAPILLARY     Status: Normal   Collection Time   11/03/12 12:01 PM      Component Value Range Comment   Glucose-Capillary 72  70 - 99 mg/dL     Physical Findings: AIMS: Facial and Oral Movements Muscles of Facial Expression: None, normal Lips and Perioral Area: None, normal Jaw: None, normal Tongue: None, normal,Extremity Movements Upper (arms, wrists, hands, fingers): None, normal Lower (legs, knees, ankles, toes): None, normal, Trunk Movements Neck, shoulders, hips: None, normal, Overall Severity Severity of abnormal movements (highest score from questions above): None, normal Incapacitation due to abnormal movements: None, normal Patient's awareness of abnormal movements (rate only patient's report): No Awareness, Dental Status Current problems with teeth and/or dentures?: No Does patient usually wear dentures?: Yes  CIWA:    COWS:     Treatment Plan Summary: Daily contact with patient to assess and evaluate symptoms and progress in treatment Medication management  Plan: 1. Plan to d/c tomorrow to motel with plans to follow up. 2. Get consent to forward information to her PCP Dr. Candis Musa. 3. Will do medication reconciliation early.  Medical Decision Making Problem Points:  Established problem, stable/improving (1) Data Points:  Review or order clinical lab tests (1) Review of medication regiment & side effects (2)  I certify that inpatient services furnished can reasonably be expected to improve the patient's condition.  [name]. Delcia Spitzley PAC 11/03/2012, 1:10 PM

## 2012-11-03 NOTE — Progress Notes (Signed)
[facility] LCSW Aftercare Discharge Planning Group Note  11/03/2012 4:49 PM  Participation Quality:  Attentive  Affect:  Appropriate  Cognitive:  Oriented  Insight:  Limited  Engagement in Group:  Engaged  Modes of Intervention:  Discussion, Exploration and Socialization  Summary of Progress/Problems: Shannon [name] is hoping to leave today.  Although she does not have a confirmed bed at the shelter, she is unconcerned and states she will get a room at a hotel for a night or two if not able to secure a bed at the shelter.  Plans to follow up at [facility].  Daryel Gerald B 11/03/2012, 4:49 PM

## 2012-11-03 NOTE — Progress Notes (Signed)
The focus of this group is to help patients review their daily goal of treatment and discuss progress on daily workbooks. Pt attended the evening group and participated in the discussion about finding positives and expressing needs. Pt reported over and over that she was looking forward to going home tomorrow very much and returned to this topic when answering any other discussion prompts.

## 2012-11-03 NOTE — Progress Notes (Signed)
Patient ID: Shannon [name], female   DOB: 06/01/1951, 62 y.o.   MRN: [id number] Patient continues to have disorganized thoughts; appears to be at baseline.  She is eager to be discharged.  Patient thought she was being discharged today, however, will probably go tomorrow.  She will either go to a hotel or to a shelter.  She denies any SI/HI/AVH.  Continue to monitor medication management, MD orders, safety checks q 15 minutes orders per protocol.    Patient's behavior is appropriate; encourage and support patient as needed.  Patient is attending groups and participating in her treatment.

## 2012-11-03 NOTE — Progress Notes (Signed)
Patient ID: Shannon [name], female   DOB: [birth year], 62 y.o.   MRN: [id number] PER STATE REGULATIONS 482.30  THIS CHART WAS REVIEWED FOR MEDICAL NECESSITY WITH RESPECT TO THE PATIENT'S ADMISSION/ DURATION OF STAY.  NEXT REVIEW DATE: 11/06/2012  Willa Rough, RN, BSN CASE MANAGER

## 2012-11-03 NOTE — Progress Notes (Signed)
Psychoeducational Group Note  Date:  11/03/2012 Time:  2000  Group Topic/Focus:  Wrap-Up Group:   The focus of this group is to help patients review their daily goal of treatment and discuss progress on daily workbooks.  Participation Level:  Active  Participation Quality:  Appropriate  Affect:  Appropriate  Cognitive:  Appropriate  Insight:  Limited  Engagement in Group:  Engaged  Additional Comments:  Patient attended and participated in group tonight. She report having a good day. She eat all her meals and attended all her groups. Patient advised that she is in need of a support system and somewhere to stay.  Lita Mains [facility] 11/03/2012, 12:05 AM

## 2012-11-03 NOTE — Clinical Social Work Note (Signed)
  Type of Therapy: Process Group Therapy  Participation Level:  Active  Participation Quality:  Attentive  Affect:  Appropriate  Cognitive:  Oriented  Insight:  Limited  Engagement in Group:  Engaged  Engagement in Therapy:  Limited  Modes of Intervention:  Activity, Clarification, Education, Problem-solving and Support  Summary of Progress/Problems: Today's group addressed the issue of overcoming obstacles.  Patients were asked to identify their biggest obstacle post d/c that stands in the way of their on-going success, and then problem solve as to how to manage this. Kalise states he biggest obstacle is getting out of here.  She was not angry, but also was not able to move off of her preoccupation with leaving here.  Offered others feedback and support.       Ida Rogue 11/03/2012   4:54 PM

## 2012-11-04 LAB — GLUCOSE, CAPILLARY: Glucose-Capillary: 70 mg/dL (ref 70–99)

## 2012-11-04 MED ORDER — IRBESARTAN 150 MG PO TABS: 150.0000 mg | ORAL_TABLET | Freq: Every day | ORAL | Status: DC

## 2012-11-04 MED ORDER — TRAZODONE HCL 150 MG PO TABS: 150.0000 mg | ORAL_TABLET | Freq: Every day | ORAL | Status: DC

## 2012-11-04 MED ORDER — AMLODIPINE BESYLATE 5 MG PO TABS: 5.0000 mg | ORAL_TABLET | Freq: Every day | ORAL | Status: AC

## 2012-11-04 MED ORDER — SIMVASTATIN 20 MG PO TABS: 20.0000 mg | ORAL_TABLET | Freq: Every day | ORAL | Status: DC

## 2012-11-04 MED ORDER — PANTOPRAZOLE SODIUM 40 MG PO TBEC: 40.0000 mg | DELAYED_RELEASE_TABLET | Freq: Every day | ORAL | Status: DC

## 2012-11-04 MED ORDER — PIOGLITAZONE HCL 15 MG PO TABS: 15.0000 mg | ORAL_TABLET | Freq: Every day | ORAL | Status: DC

## 2012-11-04 MED ORDER — HYDROCHLOROTHIAZIDE 12.5 MG PO CAPS: 12.5000 mg | ORAL_CAPSULE | Freq: Every day | ORAL | Status: DC

## 2012-11-04 MED ORDER — BENZTROPINE MESYLATE 1 MG PO TABS: 1.0000 mg | ORAL_TABLET | Freq: Two times a day (BID) | ORAL | Status: DC

## 2012-11-04 MED ORDER — PERPHENAZINE 8 MG PO TABS: 8.0000 mg | ORAL_TABLET | Freq: Two times a day (BID) | ORAL | Status: DC

## 2012-11-04 NOTE — Progress Notes (Addendum)
Patient ID: NARELY NOBLES, female   DOB: [birth year], 62 y.o.   MRN: [id number]  D: Pt was pleasant and cooperative. When asked about her day pt informed the writer that the "food gives her heartburn". Writer asked pt about her impending discharge, and what her plans entailed. Pt informed the writer that she plans to "go to her sister's house. Because she cooks and will let me take my shower".  Pt stated that if her sister permits she'll stay, and just "help her sister out". But stated that if she not permitted to stay with her sister then she's prepared to stay a hotel for several days.    A: Support and encouragement was offered. 15 min checks continued for safety.  R: Pt remains safe.

## 2012-11-04 NOTE — Progress Notes (Signed)
[facility] Adult Case Management Discharge Plan :  Will you be returning to the same living situation after discharge: No At discharge, do you have transportation home?:Yes,  bus pass Do you have the ability to pay for your medications:Yes,  MCD  Release of information consent forms completed and in the chart;  Patient's signature needed at discharge.  Patient to Follow up at: Follow-up Information    Follow up with [facility] On 11/06/2012. (They will see you at 9:00 on Thursday at the [facility].  Call them tomorrow if you want to meet them at a different place at 9:00.)    Contact information:   [address]  [facility]         Patient denies SI/HI:   Yes,  yes    Aeronautical engineer and Suicide Prevention discussed:  Yes,  yes  Daryel Gerald B 11/04/2012, 12:12 PM

## 2012-11-04 NOTE — Progress Notes (Signed)
Adult Psychoeducational Group Note  Date:  11/04/2012 Time:  9:56 AM  Group Topic/Focus:  Recovery Goals:   The focus of this group is to identify appropriate goals for recovery and establish a plan to achieve them.  Participation Level:  Active  Participation Quality:  Appropriate, Attentive and Sharing  Affect:  Appropriate  Cognitive:  Alert and Appropriate  Insight: Appropriate  Engagement in Group:  Engaged  Modes of Intervention:  Discussion  Additional Comments:  Pt was appropriate and attentive while attending group. Pt stated that recovery to her was getting better and that her goal was to have her own house and car in the future.   Sharyn Lull 11/04/2012, 9:56 AM

## 2012-11-04 NOTE — Progress Notes (Addendum)
Patient ID: Shannon [name], female   DOB: [birth year], 62 y.o.   MRN: [id number] Patient to be discharged today per MD order. Patient will go to her sister's home to stay for a few days until she can procure a bed at a shelter.  Her sister will be picking her up this afternoon.  Patient denies any SI/HI/AVH.  She denies any depression or hopelessness.  She has been compliant with her medications.    Continue 15 minute safety checks per protocol until discharge is complete.  Patient discharged with bus pass.  Patient left ambulatory without incident.

## 2012-11-04 NOTE — [facility] Suicide Risk Assessment (Signed)
Suicide Risk Assessment  Discharge Assessment     Demographic Factors:  Low socioeconomic status and FEMALE  Mental Status Per Nursing Assessment::   On Admission:  NA  Current Mental Status by Physician: NA  Loss Factors: Decrease in vocational status, Decline in physical health and Financial problems/change in socioeconomic status  Historical Factors: NA  Risk Reduction Factors:   Religious beliefs about death and Positive social support  Continued Clinical Symptoms:  Resolving delusions  Cognitive Features That Contribute To Risk:  Closed-mindedness Polarized thinking    Suicide Risk:  Minimal: No identifiable suicidal ideation.  Patients presenting with no risk factors but with morbid ruminations; may be classified as minimal risk based on the severity of the depressive symptoms  Discharge Diagnoses:   AXIS I:  Schizophrenia, paranoid type  AXIS II:  Deferred AXIS III:   Past Medical History  Diagnosis Date  . Hypertension   . Diabetes mellitus   . Hyperlipidemia    AXIS IV:  economic problems, housing problems, other psychosocial or environmental problems and problems related to social environment AXIS V:  61-70 mild symptoms  Plan Of Care/Follow-up recommendations:  Activity:  as tolerated Diet:  healthy Tests:  routine Other:  patient to keep her after care appointment.  Is patient on multiple antipsychotic therapies at discharge:  No   Has Patient had three or more failed trials of antipsychotic monotherapy by history:  No  Recommended Plan for Multiple Antipsychotic Therapies: N/A  Davelyn Gwinn,MD 11/04/2012, 10:06 AM

## 2012-11-07 NOTE — Progress Notes (Signed)
Patient Discharge Instructions:  After Visit Summary (AVS):   Faxed to:  11/07/12 Psychiatric Admission Assessment Note:   Faxed to:  11/07/12 Suicide Risk Assessment - Discharge Assessment:   Faxed to:  11/07/12 Faxed/Sent to the Next Level Care provider:  11/07/12 Faxed to [facility] @ [phone] Jerelene Redden, 11/07/2012, 3:06 PM
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
## 2012-11-17 ENCOUNTER — Emergency Department (HOSPITAL_COMMUNITY)
Admission: EM | Admit: 2012-11-17 | Discharge: 2012-11-18 | Disposition: A | Discharge status: Other Acute Inpatient Hospital | Payer: PRIVATE HEALTH INSURANCE | Attending: Emergency Medicine | Admitting: Emergency Medicine

## 2012-11-17 ENCOUNTER — Emergency Department (HOSPITAL_COMMUNITY): Payer: PRIVATE HEALTH INSURANCE

## 2012-11-17 DIAGNOSIS — Z0289 Encounter for other administrative examinations: Secondary | ICD-10-CM | POA: Insufficient documentation

## 2012-11-17 DIAGNOSIS — F3289 Other specified depressive episodes: Secondary | ICD-10-CM | POA: Insufficient documentation

## 2012-11-17 DIAGNOSIS — F209 Schizophrenia, unspecified: Secondary | ICD-10-CM | POA: Insufficient documentation

## 2012-11-17 DIAGNOSIS — Z79899 Other long term (current) drug therapy: Secondary | ICD-10-CM | POA: Insufficient documentation

## 2012-11-17 DIAGNOSIS — Z9119 Patient's noncompliance with other medical treatment and regimen: Secondary | ICD-10-CM | POA: Insufficient documentation

## 2012-11-17 DIAGNOSIS — F411 Generalized anxiety disorder: Secondary | ICD-10-CM | POA: Insufficient documentation

## 2012-11-17 DIAGNOSIS — F17200 Nicotine dependence, unspecified, uncomplicated: Secondary | ICD-10-CM | POA: Insufficient documentation

## 2012-11-17 DIAGNOSIS — Z91199 Patient's noncompliance with other medical treatment and regimen due to unspecified reason: Secondary | ICD-10-CM | POA: Insufficient documentation

## 2012-11-17 DIAGNOSIS — I10 Essential (primary) hypertension: Secondary | ICD-10-CM | POA: Insufficient documentation

## 2012-11-17 DIAGNOSIS — E785 Hyperlipidemia, unspecified: Secondary | ICD-10-CM | POA: Insufficient documentation

## 2012-11-17 DIAGNOSIS — Z008 Encounter for other general examination: Secondary | ICD-10-CM

## 2012-11-17 DIAGNOSIS — F329 Major depressive disorder, single episode, unspecified: Secondary | ICD-10-CM | POA: Insufficient documentation

## 2012-11-17 DIAGNOSIS — Z8659 Personal history of other mental and behavioral disorders: Secondary | ICD-10-CM | POA: Insufficient documentation

## 2012-11-17 DIAGNOSIS — E119 Type 2 diabetes mellitus without complications: Secondary | ICD-10-CM | POA: Insufficient documentation

## 2012-11-17 LAB — RAPID URINE DRUG SCREEN, HOSP PERFORMED
Amphetamines: NOT DETECTED
Barbiturates: NOT DETECTED
Benzodiazepines: NOT DETECTED
Cocaine: NOT DETECTED
Opiates: NOT DETECTED
Tetrahydrocannabinol: NOT DETECTED

## 2012-11-17 LAB — URINALYSIS, ROUTINE W REFLEX MICROSCOPIC
Bilirubin Urine: NEGATIVE
Glucose, UA: NEGATIVE mg/dL
Hgb urine dipstick: NEGATIVE
Nitrite: NEGATIVE
Protein, ur: NEGATIVE mg/dL
Specific Gravity, Urine: 1.026 (ref 1.005–1.030)
Urobilinogen, UA: 1 mg/dL (ref 0.0–1.0)
pH: 7 (ref 5.0–8.0)

## 2012-11-17 LAB — CBC WITH DIFFERENTIAL/PLATELET
Basophils Absolute: 0 10*3/uL (ref 0.0–0.1)
Basophils Relative: 1 % (ref 0–1)
Eosinophils Absolute: 0.2 10*3/uL (ref 0.0–0.7)
Eosinophils Relative: 3 % (ref 0–5)
HCT: 38.2 % (ref 36.0–46.0)
Hemoglobin: 12.3 g/dL (ref 12.0–15.0)
Lymphocytes Relative: 53 % — ABNORMAL HIGH (ref 12–46)
Lymphs Abs: 3.4 10*3/uL (ref 0.7–4.0)
MCH: 24.1 pg — ABNORMAL LOW (ref 26.0–34.0)
MCHC: 32.2 g/dL (ref 30.0–36.0)
MCV: 74.9 fL — ABNORMAL LOW (ref 78.0–100.0)
Monocytes Absolute: 0.5 10*3/uL (ref 0.1–1.0)
Monocytes Relative: 7 % (ref 3–12)
Neutro Abs: 2.4 10*3/uL (ref 1.7–7.7)
Neutrophils Relative %: 37 % — ABNORMAL LOW (ref 43–77)
Platelets: 222 10*3/uL (ref 150–400)
RBC: 5.1 MIL/uL (ref 3.87–5.11)
RDW: 15.2 % (ref 11.5–15.5)
WBC: 6.5 10*3/uL (ref 4.0–10.5)

## 2012-11-17 LAB — URINE MICROSCOPIC-ADD ON

## 2012-11-17 NOTE — ED Provider Notes (Signed)
History  This chart was scribed for non-physician practitioner working with [name], MD by Ardeen Jourdain, ED Scribe. This patient was seen in room WTR4/WLPT4 and the patient's care was started at 2303.  CSN: [id number]  Arrival date & time 11/17/12  2213   First MD Initiated Contact with Patient 11/17/12 2303      Chief Complaint  Patient presents with  . Medical Clearance     The history is provided by the patient. No language interpreter was used.    Shannon [name] is a 62 y.o. female with a h/o schizophrenia, brought in by [facility] PD, who presents to the Emergency Department needing medical clearance for admission at [facility]. Pt has been accepted at [facility] due to her paranoid schizophrenia. Per Vesta Mixer pt has a h/o auditory and visual hallucinations as well has violent behavior. She is IVC. Pt will be accepted back to [facility] after she is medically cleared.  Patient denies any physical complaints at this time.  She denies any recent alcohol or drug use.  She denies SI and HI at this time.  She reports that she has not taken her Psychiatric medications for several months.     Past Medical History  Diagnosis Date  . Hypertension   . Diabetes mellitus   . Hyperlipidemia   . Depression   . Anxiety   . Schizophrenia     Past Surgical History  Procedure Date  . Breast surgery   . Exploratory laparotomy     Family History  Problem Relation Age of Onset  . Kidney disease Mother     History  Substance Use Topics  . Smoking status: Current Every Day Smoker -- 1.0 packs/day for 40 years    Types: Cigarettes  . Smokeless tobacco: Never Used  . Alcohol Use: 0.6 oz/week    1 Cans of beer per week    OB History    Grav Para Term Preterm Abortions TAB SAB Ect Mult Living   0 0 0 0 0 0 0 0 0 0       Review of Systems  Psychiatric/Behavioral: Positive for hallucinations, behavioral problems and agitation.       Medical clearance   All other systems reviewed and  are negative.    Allergies  Review of patient's allergies indicates no known allergies.  Home Medications   Current Outpatient Rx  Name  Route  Sig  Dispense  Refill  . AMLODIPINE BESYLATE 5 MG PO TABS   Oral   Take 1 tablet (5 mg total) by mouth daily. For hypertension.   30 tablet   0   . BENZTROPINE MESYLATE 1 MG PO TABS   Oral   Take 1 tablet (1 mg total) by mouth 2 (two) times daily. For side effects.   60 tablet   0   . HYDROCHLOROTHIAZIDE 12.5 MG PO CAPS   Oral   Take 1 capsule (12.5 mg total) by mouth daily. For hypertension.   30 capsule   0   . IRBESARTAN 150 MG PO TABS   Oral   Take 1 tablet (150 mg total) by mouth daily. For hypertension.   30 tablet   0   . PANTOPRAZOLE SODIUM 40 MG PO TBEC   Oral   Take 1 tablet (40 mg total) by mouth daily. For reflux   30 tablet   0   . PERPHENAZINE 8 MG PO TABS   Oral   Take 1 tablet (8 mg total) by mouth 2 (  two) times daily.   60 tablet   0   . PIOGLITAZONE HCL 15 MG PO TABS   Oral   Take 1 tablet (15 mg total) by mouth daily. For glycemic control.   30 tablet   0   . SIMVASTATIN 20 MG PO TABS   Oral   Take 1 tablet (20 mg total) by mouth daily at 6 PM. For lipid control   30 tablet   0   . TRAZODONE HCL 150 MG PO TABS   Oral   Take 1 tablet (150 mg total) by mouth at bedtime. For insomnia   30 tablet   0     Triage Vitals: BP 120/65  Pulse 77  Temp 97.8 F (36.6 C) (Oral)  Resp 18  SpO2 100%  Physical Exam  Nursing note and vitals reviewed. Constitutional: She is oriented to person, place, and time. She appears well-developed and well-nourished. No distress.  HENT:  Head: Normocephalic and atraumatic.  Right Ear: External ear normal.  Left Ear: External ear normal.  Mouth/Throat: Oropharynx is clear and moist. No oropharyngeal exudate.       No pharyngeal erythema   Eyes: Conjunctivae normal and EOM are normal. Pupils are equal, round, and reactive to light. Right eye exhibits no  discharge. Left eye exhibits no discharge.  Neck: Normal range of motion. Neck supple. No tracheal deviation present.  Cardiovascular: Normal rate, regular rhythm and normal heart sounds.  Exam reveals no gallop and no friction rub.   No murmur heard. Pulmonary/Chest: Effort normal and breath sounds normal. No respiratory distress. She has no wheezes. She has no rales. She exhibits no tenderness.  Abdominal: Soft. Bowel sounds are normal. She exhibits no distension.  Musculoskeletal: Normal range of motion. She exhibits no edema.  Neurological: She is alert and oriented to person, place, and time.  Skin: Skin is warm and dry.  Psychiatric: Her behavior is normal. Her mood appears anxious. Her speech is rapid and/or pressured and tangential. Thought content is paranoid. She expresses no homicidal and no suicidal ideation. She expresses no suicidal plans and no homicidal plans.    ED Course  Procedures (including critical care time)  DIAGNOSTIC STUDIES: Oxygen Saturation is 100% on room air, normal by my interpretation.    COORDINATION OF CARE:  11:05 PM: Discussed treatment plan which includes Ethanol, urine rapid drug screen, UA, CBC, CMP with pt at bedside and pt agreed to plan.    Results for orders placed during the hospital encounter of 11/17/12  URINE RAPID DRUG SCREEN (HOSP PERFORMED)      Component Value Range   Opiates NONE DETECTED  NONE DETECTED   Cocaine NONE DETECTED  NONE DETECTED   Benzodiazepines NONE DETECTED  NONE DETECTED   Amphetamines NONE DETECTED  NONE DETECTED   Tetrahydrocannabinol NONE DETECTED  NONE DETECTED   Barbiturates NONE DETECTED  NONE DETECTED  URINALYSIS, ROUTINE W REFLEX MICROSCOPIC      Component Value Range   Color, Urine YELLOW  YELLOW   APPearance CLEAR  CLEAR   Specific Gravity, Urine 1.026  1.005 - 1.030   pH 7.0  5.0 - 8.0   Glucose, UA NEGATIVE  NEGATIVE mg/dL   Hgb urine dipstick NEGATIVE  NEGATIVE   Bilirubin Urine NEGATIVE   NEGATIVE   Ketones, ur TRACE (*) NEGATIVE mg/dL   Protein, ur NEGATIVE  NEGATIVE mg/dL   Urobilinogen, UA 1.0  0.0 - 1.0 mg/dL   Nitrite NEGATIVE  NEGATIVE   Leukocytes, UA  SMALL (*) NEGATIVE  URINE MICROSCOPIC-ADD ON      Component Value Range   Squamous Epithelial / LPF FEW (*) RARE   WBC, UA 3-6  <3 WBC/hpf   Bacteria, UA RARE  RARE    No results found.  No diagnosis found.    MDM  Patient with a history of Schizophrenia presents today from [facility] in order to obtain medical clearance and get lab work done.  Lab work unremarkable.  Patient discharged back to [facility]  I personally performed the services described in this documentation, which was scribed in my presence. The recorded information has been reviewed and is accurate.    Pascal Lux [place], PA-C 11/18/12 1446

## 2012-11-17 NOTE — ED Notes (Signed)
Brought in by [facility] from [facility] for med clearance- orders entered per referral form- hx of schizophrenia

## 2012-11-17 NOTE — Discharge Summary (Signed)
Seen and agreed. Leelyn Jasinski, MD 

## 2012-11-18 LAB — COMPREHENSIVE METABOLIC PANEL
ALT: 15 U/L (ref 0–35)
AST: 18 U/L (ref 0–37)
Albumin: 3 g/dL — ABNORMAL LOW (ref 3.5–5.2)
Alkaline Phosphatase: 108 U/L (ref 39–117)
BUN: 9 mg/dL (ref 6–23)
CO2: 30 mEq/L (ref 19–32)
Calcium: 8.9 mg/dL (ref 8.4–10.5)
Chloride: 98 mEq/L (ref 96–112)
Creatinine, Ser: 0.55 mg/dL (ref 0.50–1.10)
GFR calc Af Amer: >90 mL/min (ref >90)
GFR calc non Af Amer: >90 mL/min (ref >90)
Glucose, Bld: 100 mg/dL — ABNORMAL HIGH (ref 70–99)
Potassium: 4.5 mEq/L (ref 3.5–5.1)
Sodium: 134 mEq/L — ABNORMAL LOW (ref 135–145)
Total Bilirubin: 0.1 mg/dL — ABNORMAL LOW (ref 0.3–1.2)
Total Protein: 6.7 g/dL (ref 6.0–8.3)

## 2012-11-18 LAB — ETHANOL: Alcohol, Ethyl (B): <11 mg/dL (ref 0–11)

## 2012-11-18 NOTE — ED Provider Notes (Signed)
Medical screening examination/treatment/procedure(s) were performed by non-physician practitioner and as supervising physician I was immediately available for consultation/collaboration.   Benny Lennert, MD 11/18/12 [phone]

## 2012-11-18 NOTE — ED Notes (Signed)
Lab results faxed to [facility]- spoke to [place]- okay for pt to return at this time

## 2013-03-09 ENCOUNTER — Encounter: Payer: Self-pay | Admitting: Obstetrics

## 2013-04-09 ENCOUNTER — Ambulatory Visit: Payer: Self-pay | Admitting: Obstetrics

## 2013-05-28 ENCOUNTER — Ambulatory Visit (INDEPENDENT_AMBULATORY_CARE_PROVIDER_SITE_OTHER): Payer: PRIVATE HEALTH INSURANCE | Admitting: Obstetrics

## 2013-05-28 ENCOUNTER — Encounter: Payer: Self-pay | Admitting: Obstetrics

## 2013-05-28 VITALS — BP 130/68 | HR 69 | Temp 98.3°F | Ht 63.0 in | Wt 135.6 lb

## 2013-05-28 DIAGNOSIS — N760 Acute vaginitis: Secondary | ICD-10-CM

## 2013-05-28 DIAGNOSIS — Z124 Encounter for screening for malignant neoplasm of cervix: Secondary | ICD-10-CM

## 2013-05-28 DIAGNOSIS — Z01419 Encounter for gynecological examination (general) (routine) without abnormal findings: Secondary | ICD-10-CM

## 2013-05-28 NOTE — Addendum Note (Signed)
Addended by: Julaine Hua on: 05/28/2013 05:26 PM   Modules accepted: Orders

## 2013-05-28 NOTE — Progress Notes (Signed)
.   Subjective:     Shannon [name] is a 62 y.o. female here for a routine annual exam.  No current complaints.  Personal health questionnaire reviewed: yes.   Gynecologic History No LMP recorded. Patient is postmenopausal. Contraception: none Last Pap: 2011. Results were: normal Last mammogram: 2012. Results were: normal  Obstetric History OB History   Grav Para Term Preterm Abortions TAB SAB Ect Mult Living   0 0 0 0 0 0 0 0 0 0        The following portions of the patient's history were reviewed and updated as appropriate: allergies, current medications, past family history, past medical history, past social history, past surgical history and problem list.  Review of Systems Pertinent items are noted in HPI.    Objective:    General appearance: alert and no distress Breasts: normal appearance, no masses or tenderness Abdomen: normal findings: soft, non-tender Pelvic: cervix normal in appearance, external genitalia normal, no adnexal masses or tenderness, no cervical motion tenderness, uterus normal size, shape, and consistency and vagina normal without discharge Extremities: extremities normal, atraumatic, no cyanosis or edema    Assessment:    Healthy female exam.    Plan:    Education reviewed: calcium supplements and self breast exams.

## 2013-05-29 LAB — PAP IG W/ RFLX HPV ASCU

## 2013-05-29 LAB — WET PREP BY MOLECULAR PROBE
Candida species: NEGATIVE
Gardnerella vaginalis: NEGATIVE
Trichomonas vaginosis: NEGATIVE

## 2013-06-05 ENCOUNTER — Other Ambulatory Visit (HOSPITAL_COMMUNITY): Payer: Self-pay | Admitting: Nurse Practitioner

## 2013-06-05 DIAGNOSIS — Z1231 Encounter for screening mammogram for malignant neoplasm of breast: Secondary | ICD-10-CM

## 2013-06-12 ENCOUNTER — Ambulatory Visit (HOSPITAL_COMMUNITY): Payer: Medicare Other

## 2013-07-08 ENCOUNTER — Ambulatory Visit (HOSPITAL_COMMUNITY)
Admission: RE | Admit: 2013-07-08 | Discharge: 2013-07-08 | Disposition: A | Discharge status: Home / Self Care | Payer: PRIVATE HEALTH INSURANCE | Source: Ambulatory Visit | Attending: Nurse Practitioner | Admitting: Nurse Practitioner

## 2013-07-08 DIAGNOSIS — Z1231 Encounter for screening mammogram for malignant neoplasm of breast: Secondary | ICD-10-CM | POA: Insufficient documentation

## 2014-10-26 DIAGNOSIS — R030 Elevated blood-pressure reading, without diagnosis of hypertension: Secondary | ICD-10-CM | POA: Diagnosis not present

## 2014-10-26 DIAGNOSIS — S0990XA Unspecified injury of head, initial encounter: Secondary | ICD-10-CM | POA: Diagnosis not present

## 2015-09-13 ENCOUNTER — Ambulatory Visit
Admission: RE | Admit: 2015-09-13 | Discharge: 2015-09-13 | Disposition: A | Discharge status: Home / Self Care | Payer: Medicaid Other | Source: Ambulatory Visit | Attending: Cardiovascular Disease | Admitting: Cardiovascular Disease

## 2015-09-13 ENCOUNTER — Other Ambulatory Visit: Payer: Self-pay | Admitting: Cardiovascular Disease

## 2015-09-13 DIAGNOSIS — R079 Chest pain, unspecified: Secondary | ICD-10-CM

## 2015-10-11 ENCOUNTER — Emergency Department (HOSPITAL_COMMUNITY)
Admission: EM | Admit: 2015-10-11 | Discharge: 2015-10-12 | Disposition: A | Discharge status: Home / Self Care | Payer: Medicare Other | Attending: Emergency Medicine | Admitting: Emergency Medicine

## 2015-10-11 DIAGNOSIS — F1110 Opioid abuse, uncomplicated: Secondary | ICD-10-CM | POA: Insufficient documentation

## 2015-10-11 DIAGNOSIS — R443 Hallucinations, unspecified: Secondary | ICD-10-CM

## 2015-10-11 DIAGNOSIS — F200 Paranoid schizophrenia: Secondary | ICD-10-CM | POA: Diagnosis present

## 2015-10-11 DIAGNOSIS — F329 Major depressive disorder, single episode, unspecified: Secondary | ICD-10-CM | POA: Diagnosis not present

## 2015-10-11 DIAGNOSIS — R441 Visual hallucinations: Secondary | ICD-10-CM | POA: Insufficient documentation

## 2015-10-11 DIAGNOSIS — F419 Anxiety disorder, unspecified: Secondary | ICD-10-CM | POA: Diagnosis not present

## 2015-10-11 DIAGNOSIS — I10 Essential (primary) hypertension: Secondary | ICD-10-CM | POA: Diagnosis not present

## 2015-10-11 DIAGNOSIS — F17210 Nicotine dependence, cigarettes, uncomplicated: Secondary | ICD-10-CM | POA: Insufficient documentation

## 2015-10-11 DIAGNOSIS — Z79899 Other long term (current) drug therapy: Secondary | ICD-10-CM | POA: Insufficient documentation

## 2015-10-11 DIAGNOSIS — E785 Hyperlipidemia, unspecified: Secondary | ICD-10-CM | POA: Diagnosis not present

## 2015-10-11 DIAGNOSIS — E119 Type 2 diabetes mellitus without complications: Secondary | ICD-10-CM | POA: Diagnosis not present

## 2015-10-11 NOTE — ED Notes (Signed)
Bed: WLPT4 Expected date:  Expected time:  Means of arrival:  Comments: EMS 64 yo female from [facility]/hx schizophrenia

## 2015-10-12 ENCOUNTER — Encounter (HOSPITAL_COMMUNITY): Payer: Self-pay | Admitting: *Deleted

## 2015-10-12 DIAGNOSIS — R441 Visual hallucinations: Secondary | ICD-10-CM | POA: Diagnosis not present

## 2015-10-12 DIAGNOSIS — F200 Paranoid schizophrenia: Secondary | ICD-10-CM

## 2015-10-12 LAB — ACETAMINOPHEN LEVEL: Acetaminophen (Tylenol), Serum: <10 ug/mL — ABNORMAL LOW (ref 10–30)

## 2015-10-12 LAB — CBC
HCT: 38.5 % (ref 36.0–46.0)
Hemoglobin: 12.7 g/dL (ref 12.0–15.0)
MCH: 25.1 pg — ABNORMAL LOW (ref 26.0–34.0)
MCHC: 33 g/dL (ref 30.0–36.0)
MCV: 76.1 fL — ABNORMAL LOW (ref 78.0–100.0)
Platelets: 209 10*3/uL (ref 150–400)
RBC: 5.06 MIL/uL (ref 3.87–5.11)
RDW: 14.9 % (ref 11.5–15.5)
WBC: 8.6 10*3/uL (ref 4.0–10.5)

## 2015-10-12 LAB — SALICYLATE LEVEL: Salicylate Lvl: <4 mg/dL (ref 2.8–30.0)

## 2015-10-12 LAB — COMPREHENSIVE METABOLIC PANEL
ALT: 16 U/L (ref 14–54)
AST: 24 U/L (ref 15–41)
Albumin: 4.1 g/dL (ref 3.5–5.0)
Alkaline Phosphatase: 102 U/L (ref 38–126)
Anion gap: 9 (ref 5–15)
BUN: 12 mg/dL (ref 6–20)
CO2: 24 mmol/L (ref 22–32)
Calcium: 8.9 mg/dL (ref 8.9–10.3)
Chloride: 105 mmol/L (ref 101–111)
Creatinine, Ser: 0.65 mg/dL (ref 0.44–1.00)
GFR calc Af Amer: >60 mL/min (ref >60)
GFR calc non Af Amer: >60 mL/min (ref >60)
Glucose, Bld: 105 mg/dL — ABNORMAL HIGH (ref 65–99)
Potassium: 4.2 mmol/L (ref 3.5–5.1)
Sodium: 138 mmol/L (ref 135–145)
Total Bilirubin: 0.4 mg/dL (ref 0.3–1.2)
Total Protein: 7.3 g/dL (ref 6.5–8.1)

## 2015-10-12 LAB — ETHANOL: Alcohol, Ethyl (B): <5 mg/dL (ref <5)

## 2015-10-12 LAB — RAPID URINE DRUG SCREEN, HOSP PERFORMED
Amphetamines: NOT DETECTED
Barbiturates: NOT DETECTED
Benzodiazepines: NOT DETECTED
Cocaine: NOT DETECTED
Opiates: POSITIVE — AB
Tetrahydrocannabinol: NOT DETECTED

## 2015-10-12 MED ORDER — ALUM & MAG HYDROXIDE-SIMETH 200-200-20 MG/5ML PO SUSP: 30.0000 mL | ORAL | Status: DC | PRN

## 2015-10-12 MED ORDER — PIOGLITAZONE HCL 15 MG PO TABS
15.0000 mg | ORAL_TABLET | Freq: Every day | ORAL | Status: DC
  Filled 2015-10-12 (×2): qty 1

## 2015-10-12 MED ORDER — PERPHENAZINE 8 MG PO TABS
8.0000 mg | ORAL_TABLET | Freq: Two times a day (BID) | ORAL | Status: DC
Start: 2015-10-12 — End: 2015-10-12
  Administered 2015-10-12: 8 mg via ORAL
  Filled 2015-10-12 (×4): qty 1

## 2015-10-12 MED ORDER — TRAZODONE HCL 50 MG PO TABS: 150.0000 mg | ORAL_TABLET | Freq: Every day | ORAL | Status: DC

## 2015-10-12 MED ORDER — SIMVASTATIN 20 MG PO TABS: 20.0000 mg | ORAL_TABLET | Freq: Every day | ORAL | Status: DC

## 2015-10-12 MED ORDER — IRBESARTAN 150 MG PO TABS: 150.0000 mg | ORAL_TABLET | Freq: Every day | ORAL | Status: DC

## 2015-10-12 MED ORDER — AMLODIPINE BESYLATE 5 MG PO TABS
5.0000 mg | ORAL_TABLET | Freq: Every day | ORAL | Status: DC
  Administered 2015-10-12: 5 mg via ORAL
  Filled 2015-10-12: qty 1

## 2015-10-12 MED ORDER — HYDROCHLOROTHIAZIDE 12.5 MG PO CAPS
12.5000 mg | ORAL_CAPSULE | Freq: Every day | ORAL | Status: DC
  Administered 2015-10-12: 12.5 mg via ORAL
  Filled 2015-10-12: qty 1

## 2015-10-12 MED ORDER — SIMVASTATIN 40 MG PO TABS: 40.0000 mg | ORAL_TABLET | Freq: Every day | ORAL | Status: DC

## 2015-10-12 MED ORDER — ONDANSETRON HCL 4 MG PO TABS: 4.0000 mg | ORAL_TABLET | Freq: Three times a day (TID) | ORAL | Status: DC | PRN

## 2015-10-12 MED ORDER — ACETAMINOPHEN 325 MG PO TABS: 650.0000 mg | ORAL_TABLET | ORAL | Status: DC | PRN

## 2015-10-12 MED ORDER — BENZTROPINE MESYLATE 1 MG PO TABS
1.0000 mg | ORAL_TABLET | Freq: Two times a day (BID) | ORAL | Status: DC
  Administered 2015-10-12 (×2): 1 mg via ORAL
  Filled 2015-10-12 (×2): qty 1

## 2015-10-12 MED ORDER — PANTOPRAZOLE SODIUM 40 MG PO TBEC
40.0000 mg | DELAYED_RELEASE_TABLET | Freq: Every day | ORAL | Status: DC
  Administered 2015-10-12: 40 mg via ORAL
  Filled 2015-10-12: qty 1

## 2015-10-12 MED ORDER — IBUPROFEN 200 MG PO TABS: 600.0000 mg | ORAL_TABLET | Freq: Three times a day (TID) | ORAL | Status: DC | PRN

## 2015-10-12 NOTE — ED Notes (Signed)
Patient denies SI, and HI at this time. Patient does admit to intermittent VH at this time. Patient oriented to unit. Plan of care discussed with patient. Patient voices no complaints at this time. Encouragement and support provided and safety maintain. Q 15 min safety checks in place.
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
## 2015-10-12 NOTE — ED Provider Notes (Signed)
CSN: [id number]     Arrival date & time 10/11/15  2348 History   First MD Initiated Contact with Patient 10/12/15 0026     Chief Complaint  Patient presents with  . Hallucinations     (Consider location/radiation/quality/duration/timing/severity/associated sxs/prior Treatment) HPI Patient presents to the emergency department with hallucinations that she had earlier this evening.  The patient states that she saw shadow figures and felt like someone was trying to get her.  The patient states that she has not had any suicidal or homicidal ideation.  The patient denies chest pain, shortness of breath, weakness, dizziness, headache, blurred vision, back pain, neck pain, fever, abdominal pain, or syncope.  The patient states that she has had episodes like this in the past.  She states she does feel some better at this time Past Medical History  Diagnosis Date  . Hypertension   . Diabetes mellitus   . Hyperlipidemia   . Depression   . Anxiety   . Schizophrenia [facility])    Past Surgical History  Procedure Laterality Date  . Breast surgery    . Exploratory laparotomy     Family History  Problem Relation Age of Onset  . Kidney disease Mother    Social History  Substance Use Topics  . Smoking status: Current Every Day Smoker -- 1.00 packs/day for 40 years    Types: Cigarettes  . Smokeless tobacco: Never Used  . Alcohol Use: 0.6 oz/week    1 Cans of beer per week   OB History    Gravida Para Term Preterm AB TAB SAB Ectopic Multiple Living   0 0 0 0 0 0 0 0 0 0      Review of Systems All other systems negative except as documented in the HPI. All pertinent positives and negatives as reviewed in the HPI.Marland Kitchen  Allergies  Review of patient's allergies indicates no known allergies.  Home Medications   Prior to Admission medications   Medication Sig Start Date End Date Taking? Authorizing Provider  amLODipine (NORVASC) 5 MG tablet Take 1 tablet (5 mg total) by mouth daily. For  hypertension. 11/04/12   Ruben Im, PA-C  BENICAR 20 MG tablet Take 1 tablet by mouth daily. 04/28/13   Historical Provider, MD  benztropine (COGENTIN) 1 MG tablet Take 1 tablet (1 mg total) by mouth 2 (two) times daily. For side effects. 11/04/12   Ruben Im, PA-C  hydrochlorothiazide (MICROZIDE) 12.5 MG capsule Take 1 capsule (12.5 mg total) by mouth daily. For hypertension. 11/04/12   Ruben Im, PA-C  irbesartan (AVAPRO) 150 MG tablet Take 1 tablet (150 mg total) by mouth daily. For hypertension. 11/04/12   Ruben Im, PA-C  pantoprazole (PROTONIX) 40 MG tablet Take 1 tablet (40 mg total) by mouth daily. For reflux 11/04/12   Ruben Im, PA-C  perphenazine (TRILAFON) 8 MG tablet Take 1 tablet (8 mg total) by mouth 2 (two) times daily. 11/04/12   Ruben Im, PA-C  pioglitazone (ACTOS) 15 MG tablet Take 1 tablet (15 mg total) by mouth daily. For glycemic control. 11/04/12   Ruben Im, PA-C  simvastatin (ZOCOR) 20 MG tablet Take 1 tablet (20 mg total) by mouth daily at 6 PM. For lipid control 11/04/12   Ruben Im, PA-C  simvastatin (ZOCOR) 40 MG tablet Take 1 tablet by mouth daily. 05/22/13   Historical Provider, MD  traZODone (DESYREL) 150 MG tablet Take 1 tablet (150 mg total) by mouth at bedtime. For insomnia  11/04/12   Milta Deiters T Mashburn, PA-C   BP 130/68 mmHg  Pulse 88  Temp(Src) 97.6 F (36.4 C) (Oral)  Resp 22  SpO2 96% Physical Exam  Constitutional: She is oriented to person, place, and time. She appears well-developed and well-nourished. No distress.  HENT:  Head: Normocephalic and atraumatic.  Mouth/Throat: Oropharynx is clear and moist.  Eyes: Pupils are equal, round, and reactive to light.  Neck: Normal range of motion. Neck supple.  Cardiovascular: Normal rate, regular rhythm and normal heart sounds.  Exam reveals no gallop and no friction rub.   No murmur heard. Pulmonary/Chest: Effort normal and breath sounds normal. No respiratory distress. She  has no wheezes.  Abdominal: Soft. Bowel sounds are normal. She exhibits no distension. There is no tenderness.  Neurological: She is alert and oriented to person, place, and time. She exhibits normal muscle tone. Coordination normal.  Skin: Skin is warm and dry. No rash noted. No erythema.  Psychiatric: She has a normal mood and affect. Her behavior is normal.  Nursing note and vitals reviewed.   ED Course  Procedures (including critical care time) Labs Review Labs Reviewed  COMPREHENSIVE METABOLIC PANEL - Abnormal; Notable for the following:    Glucose, Bld 105 (*)    All other components within normal limits  ACETAMINOPHEN LEVEL - Abnormal; Notable for the following:    Acetaminophen (Tylenol), Serum <10 (*)    All other components within normal limits  CBC - Abnormal; Notable for the following:    MCV 76.1 (*)    MCH 25.1 (*)    All other components within normal limits  URINE RAPID DRUG SCREEN, HOSP PERFORMED - Abnormal; Notable for the following:    Opiates POSITIVE (*)    All other components within normal limits  ETHANOL  SALICYLATE LEVEL    Imaging Review No results found. I have personally reviewed and evaluated these images and lab results as part of my medical decision-making.   EKG Interpretation None      The patient will be evaluated for her hallucinations.    Dalia Heading, PA-C 10/12/15 [id number]  Rolland Porter, MD 10/12/15 [phone]

## 2015-10-12 NOTE — BH Assessment (Signed)
Tele Assessment Note   Shannon [name] is an 64 y.o. female African American who presents to [facility] ER arriving via EMS due to worsening hallucinations/ schizophrenic episode. Patient states that her primary concerns are hallucinations and schizophrenic episodes. Patient states that she has a problem with her current landlord, and that she has upcoming court dates due ton her landlord submitting complaints about threats from the patient.   Patient states that she has no current SI/HI, and no past history of. Patient states that she has no past history of substance abuse. Patient states that she does have current AVH, and past history of AVH. Patient states that she has been seen before inpatient psychiatric care for Schizophrenia episodes. Patient states that she currently sees [facility] for outpatient therapy and medication management. Patient states that she currently stays in a rooming house in [place].  Patient is dressed in scrubs and is alert and oriented x4. Patient speech was within normal limits and motor behavior appeared normal. Patient thought process is coherent. Patient d does not appear to be responding to internal stimuli. Patient was cooperative throughout the assessment and states that  she is agreeable to inpatient psychiatric treatment.   Diagnosis: 295.90 [F20.9] Schizophrenia  Past Medical History:  Past Medical History  Diagnosis Date  . Hypertension   . Diabetes mellitus   . Hyperlipidemia   . Depression   . Anxiety   . Schizophrenia [facility])     Past Surgical History  Procedure Laterality Date  . Breast surgery    . Exploratory laparotomy      Family History:  Family History  Problem Relation Age of Onset  . Kidney disease Mother     Social History:  reports that she has been smoking Cigarettes.  She has a 40 pack-year smoking history. She has never used smokeless tobacco. She reports that she drinks about 0.6 oz of alcohol per week. She reports that she  does not use illicit drugs.  Additional Social History:  Alcohol / Drug Use Pain Medications: SEE MAR Prescriptions: SEE MAR Over the Counter: SEE MAR History of alcohol / drug use?: No history of alcohol / drug abuse Longest period of sobriety (when/how long): NA  CIWA: CIWA-Ar BP: 130/68 mmHg Pulse Rate: 88 COWS:    PATIENT STRENGTHS: (choose at least two) Ability for insight Active sense of humor Average or above average intelligence Capable of independent living  Allergies: No Known Allergies  Home Medications:  (Not in a hospital admission)  OB/GYN Status:  No LMP recorded. Patient is postmenopausal.  General Assessment Data Location of Assessment: [facility] TTS Assessment: In system Is this a Tele or Face-to-Face Assessment?: Tele Assessment Is this an Initial Assessment or a Re-assessment for this encounter?: Initial Assessment Marital status: Single Maiden name: NA Is patient pregnant?: No Pregnancy Status: No Living Arrangements: Alone (rooming house) Can pt return to current living arrangement?: Yes Admission Status: Voluntary Is patient capable of signing voluntary admission?: Yes Referral Source: Self/Family/Friend Insurance type: Medicaid     Crisis Care Plan Living Arrangements: Alone (rooming house) Name of Psychiatrist: Warden/ranger Name of Therapist: Warden/ranger  Education Status Is patient currently in school?: No Current Grade: NA Highest grade of school patient has completed: unknown Name of school: unknown Contact person: none specified  Risk to self with the past 6 months Suicidal Ideation: No Has patient been a risk to self within the past 6 months prior to admission? : No Suicidal Intent: No Has patient had any  suicidal intent within the past 6 months prior to admission? : No Is patient at risk for suicide?: No Suicidal Plan?: No Has patient had any suicidal plan within the past 6 months prior to admission? : No Access to Means: No What has  been your use of drugs/alcohol within the last 12 months?: none Previous Attempts/Gestures: No How many times?: 0 Other Self Harm Risks: none noted Triggers for Past Attempts: Hallucinations Intentional Self Injurious Behavior: None Family Suicide History: Unknown Recent stressful life event(s): Conflict (Comment) (conflict woth landlord/ hallucination episodes) Persecutory voices/beliefs?: No Depression: Yes Depression Symptoms: Despondent, Insomnia, Tearfulness Substance abuse history and/or treatment for substance abuse?: No Suicide prevention information given to non-admitted patients: Not applicable  Risk to Others within the past 6 months Homicidal Ideation: No Does patient have any lifetime risk of violence toward others beyond the six months prior to admission? : Unknown Thoughts of Harm to Others: No Current Homicidal Intent: No Current Homicidal Plan: No Access to Homicidal Means: No Identified Victim: NA History of harm to others?: No Assessment of Violence: None Noted Violent Behavior Description: NA Does patient have access to weapons?: No Criminal Charges Pending?: Yes Describe Pending Criminal Charges: threats against landlord Does patient have a court date: Yes Court Date: 10/20/15 (and 10/31/15) Is patient on probation?: No  Psychosis Hallucinations: Auditory, Visual Delusions: None noted  Mental Status Report Appearance/Hygiene: In scrubs Eye Contact: Good Motor Activity: Unremarkable Speech: Unremarkable Level of Consciousness: Alert Mood: Pleasant Affect: Preoccupied Anxiety Level: Panic Attacks Panic attack frequency: weekly Most recent panic attack: 10/11/15 Thought Processes: Relevant, Coherent Judgement: Unimpaired Orientation: Person, Place, Time, Situation, Appropriate for developmental age Obsessive Compulsive Thoughts/Behaviors: None  Cognitive Functioning Concentration: Normal Memory: Recent Intact, Remote Intact IQ: Average Insight:  Good Impulse Control: Fair Appetite: Fair Weight Loss: 0 Weight Gain: 7 Sleep: No Change Total Hours of Sleep: 5 Vegetative Symptoms: None  ADLScreening [facility] Assessment Services) Patient's cognitive ability adequate to safely complete daily activities?: Yes Patient able to express need for assistance with ADLs?: Yes Independently performs ADLs?: Yes (appropriate for developmental age)  Prior Inpatient Therapy Prior Inpatient Therapy: Yes Prior Therapy Dates: 2014 [facility] Prior Therapy Facilty/Provider(s): [facility] Reason for Treatment: schizophrenia  Prior Outpatient Therapy Prior Outpatient Therapy: Yes Prior Therapy Dates: current Prior Therapy Facilty/Provider(s): [facility] Reason for Treatment: schizophrenia Does patient have an ACCT team?: Unknown Does patient have Intensive In-House Services?  : No Does patient have [facility] services? : Yes Does patient have P4CC services?: No  ADL Screening (condition at time of admission) Patient's cognitive ability adequate to safely complete daily activities?: Yes Is the patient deaf or have difficulty hearing?: No Does the patient have difficulty seeing, even when wearing glasses/contacts?: No Does the patient have difficulty concentrating, remembering, or making decisions?: No Patient able to express need for assistance with ADLs?: Yes Does the patient have difficulty dressing or bathing?: No Independently performs ADLs?: Yes (appropriate for developmental age) Does the patient have difficulty walking or climbing stairs?: Yes (some dificulty expressed) Weakness of Legs: None Weakness of Arms/Hands: None  Home Assistive Devices/Equipment Home Assistive Devices/Equipment: None    Abuse/Neglect Assessment (Assessment to be complete while patient is alone) Physical Abuse: Denies Verbal Abuse: Denies Sexual Abuse: Denies Exploitation of patient/patient's resources: Denies Self-Neglect: Denies Values / Beliefs Cultural Requests  During Hospitalization: None Spiritual Requests During Hospitalization: None   Advance Directives (For Healthcare) Does patient have an advance directive?: No Would patient like information on creating an advanced directive?: No -  patient declined information    Additional Information 1:1 In Past 12 Months?: No CIRT Risk: No Elopement Risk: No Does patient have medical clearance?: Yes     Disposition: Per Frederico Hamman, PA meets inpatient criteria Disposition Initial Assessment Completed for this Encounter: Yes Disposition of Patient: Other dispositions (tbd upon consult with extender)  Kristeen Mans 10/12/2015 4:24 AM

## 2015-10-12 NOTE — ED Notes (Addendum)
Pt has 3 bags of belongings.   Pt has been seen and wanded by security.  Pt has purse, cellphone, pants(2), sweater, jacket, shirt, socks, and shoes.

## 2015-10-12 NOTE — Progress Notes (Signed)
Entered in d/c instructions [name], Shannon Schedule an appointment as soon as possible for a visit This is your assigned Medicaid [place] access doctor If you prefer another contact [place] assigned your doctor *You may receive a bill if you go to any family Dr not assigned to you [name] [phone] [name] on 11/02/2015 your appt on 11/02/15 at 1:45 pm for your pap and breast exam [name] access coverage [place]: [name], [name] [email or web address] USE THIS [name] As a Medicaid client you MUST contact DSS/SSI each time you change address, move to another [name] or another state to keep your address updated [name] Medicaid Transportation to Dr appts if you are have full Medicaid: [phone], [phone]

## 2015-10-12 NOTE — ED Notes (Signed)
Patient making up her bed.  Inquired about going across the street.  Patient states, "I just love the food over there.  It is delicious."  Patient endorses auditory hallucinations that come intermittently.  She denies SI/HI.  Patient is calm and cooperative.

## 2015-10-12 NOTE — ED Notes (Signed)
Pt stated that she just used restroom and not able to void at this time.

## 2015-10-12 NOTE — BH Assessment (Signed)
[place] Assessment Progress Note  At the request of Shannon Angelica, MD, this writer called pt's brother, Shannon [name] [phone]) to obtain collateral information.  Call was placed at 09:46.  Shannon [name] reports that he sees pt about once a week with their most recent visit being yesterday, 10/11/2015.  He confirms that pt lives at a [facility] with a roommate.  He reports that pt and roommate have a great deal of conflict with each other, specifying that the roommate does things that "annoy" the pt.  He confirms that the roommate has taken out unspecified legal charges against the pt, but he believes that the charges will be "thrown out" at their upcoming court date.  He reports that he and the pt have been discussing having the pt move in with him, and notes that this could be facilitated as soon as today.  He volunteers that his address is [address] in [place].  Shannon [name] denies knowing of the pt experiencing any SI, and he denies that she has ever made a suicide attempt.  The pt has no problems with HI to his knowledge.  He denies that the pt has any problems with physical aggression toward others.  The pt has no problems with delusional thought to his knowledge.  He reports that the pt does have problems with unspecified hallucinations, but he believes that these are well regulated with the treatment that the pt receives at [facility].  To his knowledge the pt has no problems with substance abuse.  Shannon [name] reports that pt is compliant with her treatment at [facility], as well as with her medications.  He does not believe that the pt presents a danger to herself or others, and feels that she is safe in the community.  He reports that either he or their younger sister should be able to facilitate transportation back home for the pt.  These details have been staffed with Shannon [name] who believes that pt is safe for discharge from [facility].  Pt concurs with this opinion, and is agreeable to moving in with her  brother.  Shannon [name], [place] Triage Specialist [phone]

## 2015-10-12 NOTE — BHH Counselor (Signed)
10/12/15 Referral sent to [facility], [address], [place], [place], [place], [place], [place], [place], and [place] K. [place], [facility]  Counselor 10/12/2015 4:52 AM

## 2015-10-12 NOTE — ED Notes (Signed)
Pt arrives to the ER via EMS for complaints of hallucinations; per EMS pt called 911 and asked to be transported to [facility]; [facility] states that they encoded to [facility] but when they pulled in the parking lot at [facility] they staff advised them they could not come there and had to come to [facility] ER;  Pt c/o of seeing shadows and figures in the mirror looking at her over the last couple of days; pt states that she has a hx of paranoid schizophrenia and the hallucinations are making it worse; pt denies SI / HI ; pt c/o insomnia tonight even after taking Benadryl and Seroquel

## 2015-10-12 NOTE — BH Assessment (Signed)
[place] Assessment Progress Note  Per Donnelly Angelica, MD, this pt does not require psychiatric hospitalization at this time.  She is to be discharged from [facility] with recommendation to follow up with [facility], her outpatient provider.  Pt reports that she has an appointment scheduled for 10/18/2015.  Pt will be advised to keep this appointment or to present at the [facility] walk-in clinic if she feels that she needs to be seen sooner.  Pt's nurse, Chrys Racer, has been notified.  Jalene Mullet, [place] Triage Specialist [phone]

## 2015-10-12 NOTE — Discharge Instructions (Signed)
For your ongoing mental health needs, you are advised to follow up with [facility].  You have indicated that you have an appointment scheduled on Tuesday, 10/18/2015.  You are advised to keep this appointment.  If you feel that you need to be seen before that date, [facility] has walk-in hours Monday - Friday from 8:00 am - 3:00 pm.  Walk-in patients are seen on a first come, first served basis.  Try to arrive as early as possible for he best chance of being seen the same day:       [facility]      [address]      [place], [place] [address]      [phone]

## 2015-11-02 ENCOUNTER — Ambulatory Visit: Payer: Self-pay | Admitting: Obstetrics & Gynecology

## 2015-12-06 DIAGNOSIS — Z139 Encounter for screening, unspecified: Secondary | ICD-10-CM

## 2015-12-13 NOTE — Congregational Nurse Program (Signed)
Congregational Nurse Program Note  Date of Encounter: 12/06/2015  Past Medical History: Past Medical History  Diagnosis Date  . Hypertension   . Diabetes mellitus   . Hyperlipidemia   . Depression   . Anxiety   . Schizophrenia [facility])     Encounter Details:     CNP Questionnaire - 12/13/15 1426    Patient Demographics   Is this a new or existing patient? New   Patient is considered a/an Not Applicable   Race African-American/Black   Patient Assistance   Location of Patient Assistance Not Applicable   Patient's financial/insurance status Low Income;Medicaid   Uninsured Patient No   Patient referred to apply for the following financial assistance Not Applicable   Food insecurities addressed Provided food supplies   Transportation assistance No   Assistance securing medications No   Educational health offerings Hypertension   Encounter Details   Primary purpose of visit Other   Was an Emergency Department visit averted? Not Applicable   Does patient have a medical provider? Yes   Patient referred to Not Applicable   Was a mental health screening completed? (GAINS tool) No   Does patient have dental issues? No   Does patient have vision issues? No   Since previous encounter, have you referred patient for abnormal blood pressure that resulted in a new diagnosis or medication change? No   Since previous encounter, have you referred patient for abnormal blood glucose that resulted in a new diagnosis or medication change? No   For Abstraction Use Only   Does patient have insurance? Yes     Screening for B/P check.  B/P 150/80.  Instructed client to return to clinic to recheck of B/P

## 2015-12-15 DIAGNOSIS — Z139 Encounter for screening, unspecified: Secondary | ICD-10-CM

## 2015-12-19 NOTE — Congregational Nurse Program (Signed)
Congregational Nurse Program Note  Date of Encounter: 12/15/2015  Past Medical History: Past Medical History  Diagnosis Date  . Hypertension   . Diabetes mellitus   . Hyperlipidemia   . Depression   . Anxiety   . Schizophrenia [facility])     Encounter Details:     CNP Questionnaire - 12/15/15 2014    Patient Demographics   Is this a new or existing patient? Existing   Patient is considered a/an Not Applicable   Race African-American/Black   Patient Assistance   Location of Patient Assistance Not Applicable   Patient's financial/insurance status Low Income;Medicaid   Uninsured Patient No   Patient referred to apply for the following financial assistance Not Applicable   Food insecurities addressed Provided food supplies   Transportation assistance No   Assistance securing medications No   Educational health offerings Hypertension   Encounter Details   Primary purpose of visit Other   Was an Emergency Department visit averted? Not Applicable   Does patient have a medical provider? Yes   Patient referred to Follow up with established PCP   Was a mental health screening completed? (GAINS tool) No   Does patient have dental issues? No   Does patient have vision issues? No   Since previous encounter, have you referred patient for abnormal blood pressure that resulted in a new diagnosis or medication change? No   Since previous encounter, have you referred patient for abnormal blood glucose that resulted in a new diagnosis or medication change? No   For Abstraction Use Only   Does patient have insurance? Yes       B/P check

## 2017-04-15 ENCOUNTER — Ambulatory Visit
Admission: RE | Admit: 2017-04-15 | Discharge: 2017-04-15 | Disposition: A | Discharge status: Home / Self Care | Payer: Medicare Other | Source: Ambulatory Visit | Attending: Cardiovascular Disease | Admitting: Cardiovascular Disease

## 2017-04-15 ENCOUNTER — Other Ambulatory Visit: Payer: Self-pay | Admitting: Cardiovascular Disease

## 2017-04-15 DIAGNOSIS — R053 Chronic cough: Secondary | ICD-10-CM

## 2017-04-15 DIAGNOSIS — R05 Cough: Secondary | ICD-10-CM

## 2017-05-28 ENCOUNTER — Ambulatory Visit
Admission: RE | Admit: 2017-05-28 | Discharge: 2017-05-28 | Disposition: A | Discharge status: Home / Self Care | Payer: Medicare Other | Source: Ambulatory Visit | Attending: Cardiovascular Disease | Admitting: Cardiovascular Disease

## 2017-05-28 ENCOUNTER — Other Ambulatory Visit: Payer: Self-pay | Admitting: Cardiovascular Disease

## 2017-05-28 DIAGNOSIS — J189 Pneumonia, unspecified organism: Secondary | ICD-10-CM

## 2017-05-29 ENCOUNTER — Ambulatory Visit: Payer: Self-pay | Admitting: Internal Medicine

## 2018-03-11 DIAGNOSIS — S8262XA Displaced fracture of lateral malleolus of left fibula, initial encounter for closed fracture: Secondary | ICD-10-CM | POA: Diagnosis not present

## 2018-03-24 DIAGNOSIS — M25572 Pain in left ankle and joints of left foot: Secondary | ICD-10-CM | POA: Diagnosis not present

## 2018-03-24 DIAGNOSIS — S8262XA Displaced fracture of lateral malleolus of left fibula, initial encounter for closed fracture: Secondary | ICD-10-CM | POA: Diagnosis not present

## 2018-04-01 DIAGNOSIS — Z131 Encounter for screening for diabetes mellitus: Secondary | ICD-10-CM | POA: Diagnosis not present

## 2018-04-01 DIAGNOSIS — Z0100 Encounter for examination of eyes and vision without abnormal findings: Secondary | ICD-10-CM | POA: Diagnosis not present

## 2018-04-01 DIAGNOSIS — I10 Essential (primary) hypertension: Secondary | ICD-10-CM | POA: Diagnosis not present

## 2018-04-01 DIAGNOSIS — Z0110 Encounter for examination of ears and hearing without abnormal findings: Secondary | ICD-10-CM | POA: Diagnosis not present

## 2018-04-01 DIAGNOSIS — Z0000 Encounter for general adult medical examination without abnormal findings: Secondary | ICD-10-CM | POA: Diagnosis not present

## 2018-04-01 DIAGNOSIS — Z1389 Encounter for screening for other disorder: Secondary | ICD-10-CM | POA: Diagnosis not present

## 2018-04-01 DIAGNOSIS — Z5181 Encounter for therapeutic drug level monitoring: Secondary | ICD-10-CM | POA: Diagnosis not present

## 2018-04-09 DIAGNOSIS — H40013 Open angle with borderline findings, low risk, bilateral: Secondary | ICD-10-CM | POA: Diagnosis not present

## 2018-04-09 DIAGNOSIS — H25813 Combined forms of age-related cataract, bilateral: Secondary | ICD-10-CM | POA: Diagnosis not present

## 2018-04-10 DIAGNOSIS — H5213 Myopia, bilateral: Secondary | ICD-10-CM | POA: Diagnosis not present

## 2018-05-13 DIAGNOSIS — Z720 Tobacco use: Secondary | ICD-10-CM | POA: Diagnosis not present

## 2018-05-13 DIAGNOSIS — I10 Essential (primary) hypertension: Secondary | ICD-10-CM | POA: Diagnosis not present

## 2018-05-14 DIAGNOSIS — S8262XA Displaced fracture of lateral malleolus of left fibula, initial encounter for closed fracture: Secondary | ICD-10-CM | POA: Diagnosis not present

## 2018-05-14 DIAGNOSIS — M25572 Pain in left ankle and joints of left foot: Secondary | ICD-10-CM | POA: Diagnosis not present

## 2018-05-19 DIAGNOSIS — Z1231 Encounter for screening mammogram for malignant neoplasm of breast: Secondary | ICD-10-CM | POA: Diagnosis not present

## 2018-06-06 DIAGNOSIS — Z720 Tobacco use: Secondary | ICD-10-CM | POA: Diagnosis not present

## 2018-06-06 DIAGNOSIS — I10 Essential (primary) hypertension: Secondary | ICD-10-CM | POA: Diagnosis not present

## 2018-06-09 DIAGNOSIS — H5213 Myopia, bilateral: Secondary | ICD-10-CM | POA: Diagnosis not present

## 2018-06-09 DIAGNOSIS — H40013 Open angle with borderline findings, low risk, bilateral: Secondary | ICD-10-CM | POA: Diagnosis not present

## 2018-06-09 DIAGNOSIS — H2513 Age-related nuclear cataract, bilateral: Secondary | ICD-10-CM | POA: Diagnosis not present

## 2018-06-16 DIAGNOSIS — R1314 Dysphagia, pharyngoesophageal phase: Secondary | ICD-10-CM | POA: Diagnosis not present

## 2018-06-19 DIAGNOSIS — M25572 Pain in left ankle and joints of left foot: Secondary | ICD-10-CM | POA: Diagnosis not present

## 2018-06-19 DIAGNOSIS — S8262XA Displaced fracture of lateral malleolus of left fibula, initial encounter for closed fracture: Secondary | ICD-10-CM | POA: Diagnosis not present

## 2018-07-08 DIAGNOSIS — I10 Essential (primary) hypertension: Secondary | ICD-10-CM | POA: Diagnosis not present

## 2018-07-08 DIAGNOSIS — R0602 Shortness of breath: Secondary | ICD-10-CM | POA: Diagnosis not present

## 2018-07-10 DIAGNOSIS — H40013 Open angle with borderline findings, low risk, bilateral: Secondary | ICD-10-CM | POA: Diagnosis not present

## 2018-07-10 DIAGNOSIS — H2513 Age-related nuclear cataract, bilateral: Secondary | ICD-10-CM | POA: Diagnosis not present

## 2018-07-10 DIAGNOSIS — H04123 Dry eye syndrome of bilateral lacrimal glands: Secondary | ICD-10-CM | POA: Diagnosis not present

## 2018-10-30 DIAGNOSIS — R0602 Shortness of breath: Secondary | ICD-10-CM | POA: Diagnosis not present

## 2018-10-30 DIAGNOSIS — I10 Essential (primary) hypertension: Secondary | ICD-10-CM | POA: Diagnosis not present

## 2018-10-30 DIAGNOSIS — J208 Acute bronchitis due to other specified organisms: Secondary | ICD-10-CM | POA: Diagnosis not present

## 2018-12-16 DIAGNOSIS — J410 Simple chronic bronchitis: Secondary | ICD-10-CM | POA: Diagnosis not present

## 2018-12-16 DIAGNOSIS — R0602 Shortness of breath: Secondary | ICD-10-CM | POA: Diagnosis not present

## 2018-12-16 DIAGNOSIS — I10 Essential (primary) hypertension: Secondary | ICD-10-CM | POA: Diagnosis not present

## 2019-01-27 DIAGNOSIS — I10 Essential (primary) hypertension: Secondary | ICD-10-CM | POA: Diagnosis not present

## 2019-01-27 DIAGNOSIS — J410 Simple chronic bronchitis: Secondary | ICD-10-CM | POA: Diagnosis not present

## 2019-01-27 DIAGNOSIS — R0602 Shortness of breath: Secondary | ICD-10-CM | POA: Diagnosis not present

## 2019-03-23 ENCOUNTER — Other Ambulatory Visit: Payer: Self-pay | Admitting: Cardiovascular Disease

## 2019-03-23 ENCOUNTER — Ambulatory Visit
Admission: RE | Admit: 2019-03-23 | Discharge: 2019-03-23 | Disposition: A | Discharge status: Home / Self Care | Payer: Medicare Other | Source: Ambulatory Visit | Attending: Cardiovascular Disease | Admitting: Cardiovascular Disease

## 2019-03-23 DIAGNOSIS — J410 Simple chronic bronchitis: Secondary | ICD-10-CM | POA: Diagnosis not present

## 2019-03-23 DIAGNOSIS — I10 Essential (primary) hypertension: Secondary | ICD-10-CM | POA: Diagnosis not present

## 2019-03-23 DIAGNOSIS — J40 Bronchitis, not specified as acute or chronic: Secondary | ICD-10-CM

## 2019-03-23 DIAGNOSIS — R0602 Shortness of breath: Secondary | ICD-10-CM | POA: Diagnosis not present

## 2019-03-23 DIAGNOSIS — R05 Cough: Secondary | ICD-10-CM | POA: Diagnosis not present

## 2019-06-01 DIAGNOSIS — Z1231 Encounter for screening mammogram for malignant neoplasm of breast: Secondary | ICD-10-CM | POA: Diagnosis not present

## 2019-06-03 DIAGNOSIS — Z1389 Encounter for screening for other disorder: Secondary | ICD-10-CM | POA: Diagnosis not present

## 2019-06-03 DIAGNOSIS — Z0001 Encounter for general adult medical examination with abnormal findings: Secondary | ICD-10-CM | POA: Diagnosis not present

## 2019-06-03 DIAGNOSIS — I10 Essential (primary) hypertension: Secondary | ICD-10-CM | POA: Diagnosis not present

## 2019-06-03 DIAGNOSIS — Z720 Tobacco use: Secondary | ICD-10-CM | POA: Diagnosis not present

## 2019-07-06 DIAGNOSIS — M1990 Unspecified osteoarthritis, unspecified site: Secondary | ICD-10-CM | POA: Diagnosis not present

## 2019-07-06 DIAGNOSIS — I10 Essential (primary) hypertension: Secondary | ICD-10-CM | POA: Diagnosis not present

## 2019-07-06 DIAGNOSIS — Z1211 Encounter for screening for malignant neoplasm of colon: Secondary | ICD-10-CM | POA: Diagnosis not present

## 2019-07-10 DIAGNOSIS — Z0110 Encounter for examination of ears and hearing without abnormal findings: Secondary | ICD-10-CM | POA: Diagnosis not present

## 2019-07-10 DIAGNOSIS — Z0000 Encounter for general adult medical examination without abnormal findings: Secondary | ICD-10-CM | POA: Diagnosis not present

## 2019-07-10 DIAGNOSIS — I10 Essential (primary) hypertension: Secondary | ICD-10-CM | POA: Diagnosis not present

## 2019-07-10 DIAGNOSIS — E119 Type 2 diabetes mellitus without complications: Secondary | ICD-10-CM | POA: Diagnosis not present

## 2019-07-10 DIAGNOSIS — R05 Cough: Secondary | ICD-10-CM | POA: Diagnosis not present

## 2019-07-10 DIAGNOSIS — Z0101 Encounter for examination of eyes and vision with abnormal findings: Secondary | ICD-10-CM | POA: Diagnosis not present

## 2019-07-14 DIAGNOSIS — Z1211 Encounter for screening for malignant neoplasm of colon: Secondary | ICD-10-CM | POA: Diagnosis not present

## 2019-07-14 DIAGNOSIS — K6389 Other specified diseases of intestine: Secondary | ICD-10-CM | POA: Diagnosis not present

## 2019-07-14 DIAGNOSIS — K635 Polyp of colon: Secondary | ICD-10-CM | POA: Diagnosis not present

## 2019-07-15 ENCOUNTER — Other Ambulatory Visit: Payer: Self-pay | Admitting: Physician Assistant

## 2019-07-15 DIAGNOSIS — R911 Solitary pulmonary nodule: Secondary | ICD-10-CM

## 2019-07-23 DIAGNOSIS — Z720 Tobacco use: Secondary | ICD-10-CM | POA: Diagnosis not present

## 2019-07-23 DIAGNOSIS — I10 Essential (primary) hypertension: Secondary | ICD-10-CM | POA: Diagnosis not present

## 2019-07-23 DIAGNOSIS — J302 Other seasonal allergic rhinitis: Secondary | ICD-10-CM | POA: Diagnosis not present

## 2019-08-10 DIAGNOSIS — Z720 Tobacco use: Secondary | ICD-10-CM | POA: Diagnosis not present

## 2019-08-10 DIAGNOSIS — E119 Type 2 diabetes mellitus without complications: Secondary | ICD-10-CM | POA: Diagnosis not present

## 2019-08-10 DIAGNOSIS — I10 Essential (primary) hypertension: Secondary | ICD-10-CM | POA: Diagnosis not present

## 2019-08-17 ENCOUNTER — Ambulatory Visit (HOSPITAL_COMMUNITY)
Admission: RE | Admit: 2019-08-17 | Discharge: 2019-08-17 | Disposition: A | Discharge status: Home / Self Care | Payer: Medicare Other | Source: Ambulatory Visit | Attending: Physician Assistant | Admitting: Physician Assistant

## 2019-08-17 ENCOUNTER — Other Ambulatory Visit: Payer: Self-pay

## 2019-08-17 DIAGNOSIS — R911 Solitary pulmonary nodule: Secondary | ICD-10-CM | POA: Insufficient documentation

## 2019-08-17 DIAGNOSIS — J439 Emphysema, unspecified: Secondary | ICD-10-CM | POA: Diagnosis not present

## 2019-08-21 DIAGNOSIS — I10 Essential (primary) hypertension: Secondary | ICD-10-CM | POA: Diagnosis not present

## 2019-08-21 DIAGNOSIS — E119 Type 2 diabetes mellitus without complications: Secondary | ICD-10-CM | POA: Diagnosis not present

## 2019-08-21 DIAGNOSIS — Z720 Tobacco use: Secondary | ICD-10-CM | POA: Diagnosis not present

## 2019-09-28 DIAGNOSIS — R05 Cough: Secondary | ICD-10-CM | POA: Diagnosis not present

## 2019-09-28 DIAGNOSIS — E119 Type 2 diabetes mellitus without complications: Secondary | ICD-10-CM | POA: Diagnosis not present

## 2019-09-28 DIAGNOSIS — I10 Essential (primary) hypertension: Secondary | ICD-10-CM | POA: Diagnosis not present

## 2019-09-28 DIAGNOSIS — Z720 Tobacco use: Secondary | ICD-10-CM | POA: Diagnosis not present

## 2019-10-28 ENCOUNTER — Ambulatory Visit: Payer: Medicare Other | Admitting: Registered"

## 2019-11-03 ENCOUNTER — Ambulatory Visit: Payer: Medicare Other | Admitting: Registered"

## 2019-11-24 ENCOUNTER — Other Ambulatory Visit: Payer: Self-pay

## 2019-11-24 ENCOUNTER — Encounter: Payer: Medicare Other | Attending: Internal Medicine | Admitting: Registered"

## 2019-11-24 ENCOUNTER — Encounter: Payer: Self-pay | Admitting: Registered"

## 2019-11-24 DIAGNOSIS — E119 Type 2 diabetes mellitus without complications: Secondary | ICD-10-CM | POA: Diagnosis not present

## 2019-11-24 NOTE — Progress Notes (Signed)
Diabetes Self-Management Education  Visit Type: First/Initial  Appt. Start Time: 1530 Appt. End Time: 1600  11/24/2019  [name], identified by name and date of birth, is a 69 y.o. female with a diagnosis of Diabetes: Type 2.   ASSESSMENT  Height 5\' 3"  (1.6 m), weight 177 lb 12.8 oz (80.6 kg). Body mass index is 31.5 kg/m.   Patient arrived an hour early for appointment due to relying on [facility] for transportation. Patient asked how long appointment would take several times. 30 min into appointment patient said she needed to leave but wanted to come back for a follow-up appointment.  SMBG: patient states she has trouble using lancing device, did not bring device with her.   Medications: Pt poor historian, did not bring medications with her. Pt states she is not taking DM medications, Actos included in medication list in Epic.  Pt reports eating 2x day. Pt states last time she ate cake was 2 months ago someone gave her a piece of red velvet cake. Pt reports once every 2 weeks buys 12 pack and 2 L soda that lasts 2 weeks.  Diabetes Self-Management Education - 11/24/19 1534      Visit Information   Visit Type  First/Initial      Initial Visit   Diabetes Type  Type 2    Are you currently following a meal plan?  No      Psychosocial Assessment   Self-care barriers  Low literacy    Special Needs  Simplified materials    How often do you need to have someone help you when you read instructions, pamphlets, or other written materials from your doctor or pharmacy?  5 - Always    What is the last grade level you completed in school?  ?      Complications   Last HgB A1C per patient/outside source  6.6 %    How often do you check your blood sugar?  1-2 times/day    Postprandial Blood glucose range (mg/dL)  130-179   128-146 after breakfast 145-150 after dinner   Have you had a dilated eye exam in the past 12 months?  Yes    Have you had a dental exam in the past 12 months?  No    Are you checking your feet?  No      Dietary Intake   Breakfast  eggs, french fries, 2 pieces of fat back    Snack (afternoon)  chips    Dinner  fish, spinach, boiled egg OR hamburger, rice, spinach    Beverage(s)  cheerwine, punch      Exercise   Exercise Type  ADL's      Patient Education   Previous Diabetes Education  No    Nutrition management   Meal options for control of blood glucose level and chronic complications.;Role of diet in the treatment of diabetes and the relationship between the three main macronutrients and blood glucose level      Individualized Goals (developed by patient)   Nutrition  General guidelines for healthy choices and portions discussed      Outcomes   Expected Outcomes  Demonstrated interest in learning. Expect positive outcomes    Future DMSE  4-6 wks    Program Status  Completed       Individualized Plan for Diabetes Self-Management Training:   Learning Objective:  Patient will have a greater understanding of diabetes self-management. Patient education plan is to attend individual and/or group sessions per assessed  needs and concerns.   Plan:   Patient Instructions  Continue eating lots of vegetables and balanced meals. Consider changing your beverages from soda. If you need to drink something sweet get diet or sugar-free.   Expected Outcomes:  Demonstrated interest in learning. Expect positive outcomes  Education material provided: Planning Healthy Meals foldout  If problems or questions, patient to contact team via:  Phone  Future DSME appointment: 4-6 wks

## 2019-11-24 NOTE — Patient Instructions (Addendum)
Continue eating lots of vegetables and balanced meals. Consider changing your beverages from soda. If you need to drink something sweet get diet or sugar-free.

## 2020-01-05 ENCOUNTER — Ambulatory Visit: Payer: Medicare Other | Admitting: Registered"

## 2020-01-11 ENCOUNTER — Encounter: Payer: Medicare Other | Attending: Internal Medicine | Admitting: Registered"

## 2020-01-11 DIAGNOSIS — E119 Type 2 diabetes mellitus without complications: Secondary | ICD-10-CM | POA: Insufficient documentation

## 2020-02-15 DIAGNOSIS — I10 Essential (primary) hypertension: Secondary | ICD-10-CM | POA: Diagnosis not present

## 2020-02-15 DIAGNOSIS — Z0000 Encounter for general adult medical examination without abnormal findings: Secondary | ICD-10-CM | POA: Diagnosis not present

## 2020-02-15 DIAGNOSIS — Z720 Tobacco use: Secondary | ICD-10-CM | POA: Diagnosis not present

## 2020-02-15 DIAGNOSIS — E119 Type 2 diabetes mellitus without complications: Secondary | ICD-10-CM | POA: Diagnosis not present

## 2020-02-29 IMAGING — CT CT CHEST W/O CM
2 of 3 series · 15 of 36 positions shown, 18 images · non-contrast
Comparison: Chest radiograph 03/23/2019

CLINICAL DATA: FU lung nodule  No symptoms per pt  No hx of ca

EXAM:
CT CHEST WITHOUT CONTRAST
TECHNIQUE: Multidetector CT imaging of the chest was performed following the
standard protocol without IV contrast.

[Series 2: thorax · axial · 0.70mm/px · z∈[-307,-95]mm · 12 of 126 slices shown, 15 images]
[im 10/126  mediastinal]
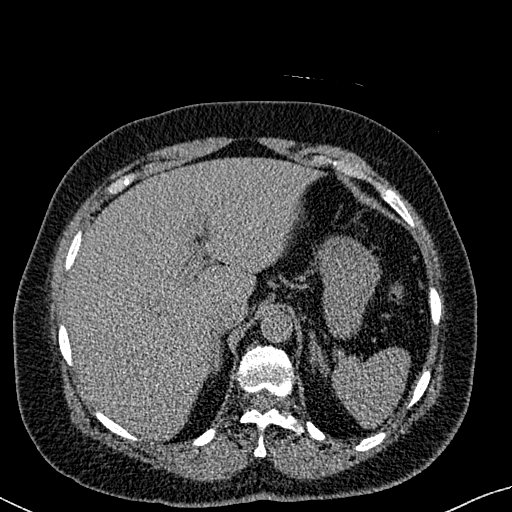
[im 10/126  lung]
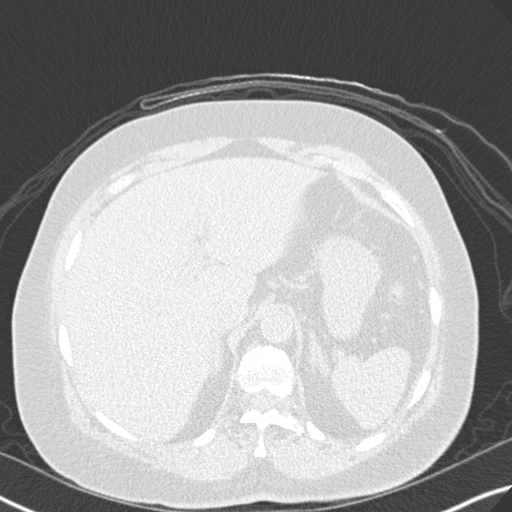
[im 19/126  lung]
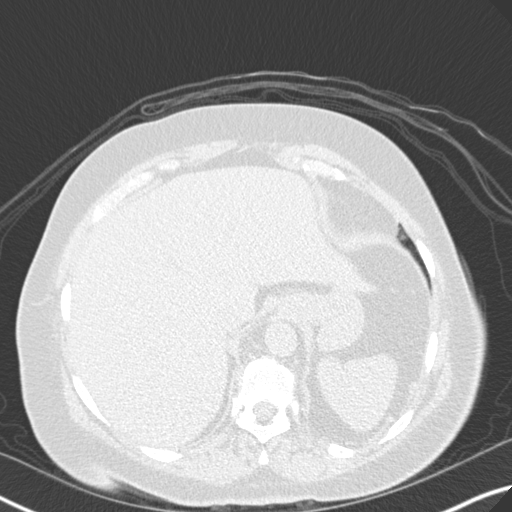
[im 28/126  lung]
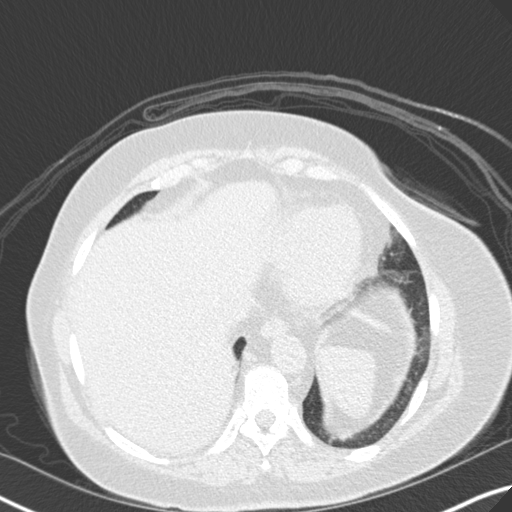
[im 38/126  lung]
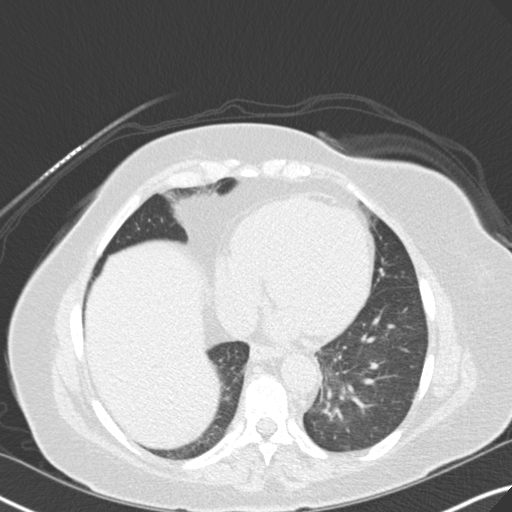
[im 47/126  mediastinal]
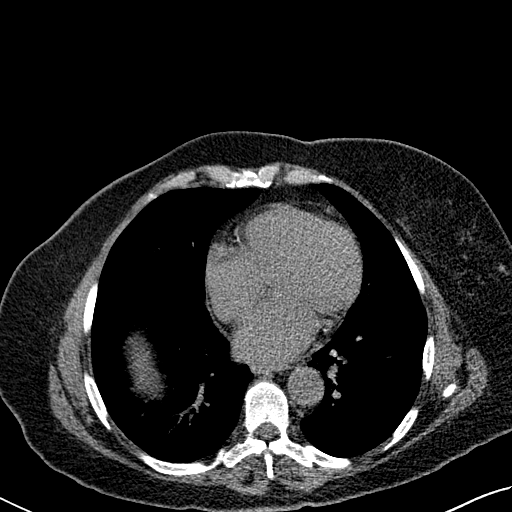
[im 47/126  lung]
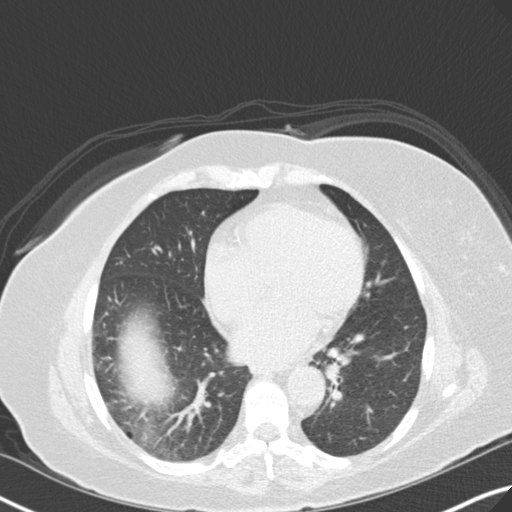
[im 56/126  lung]
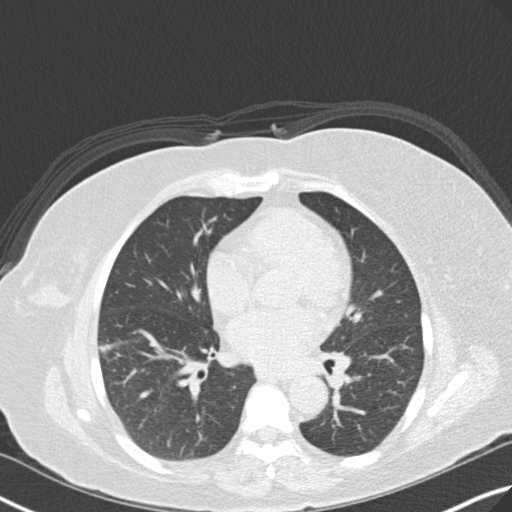
[im 70/126  lung]
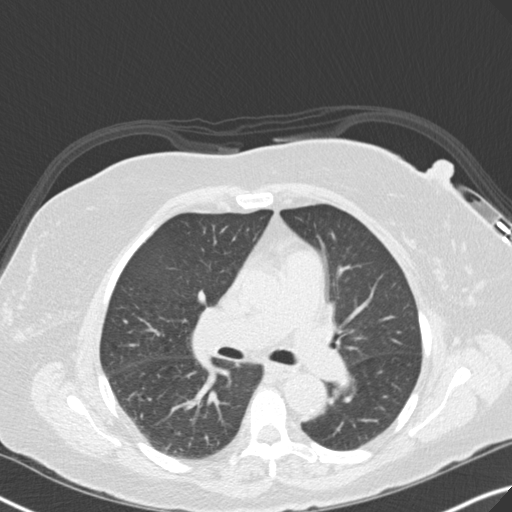
[im 79/126  lung]
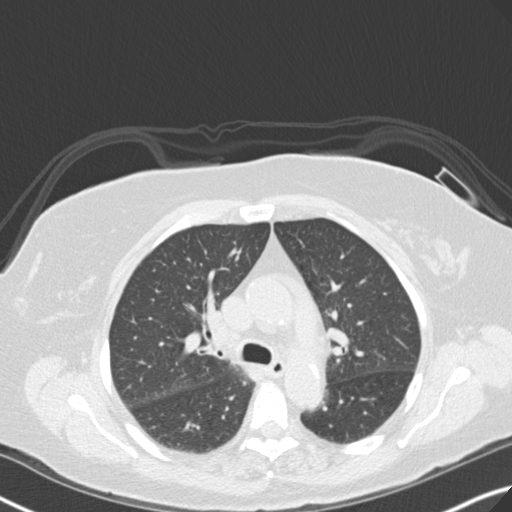
[im 88/126  mediastinal]
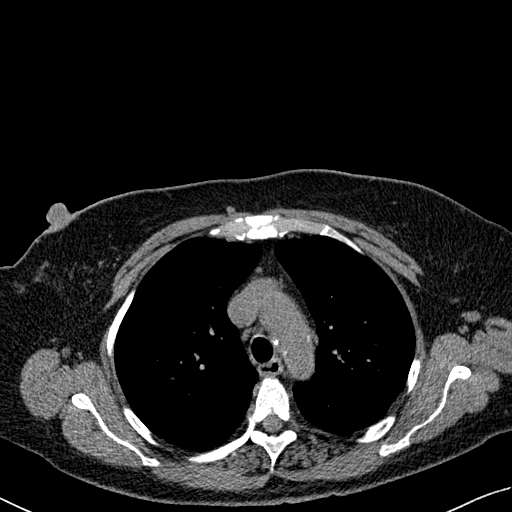
[im 88/126  lung]
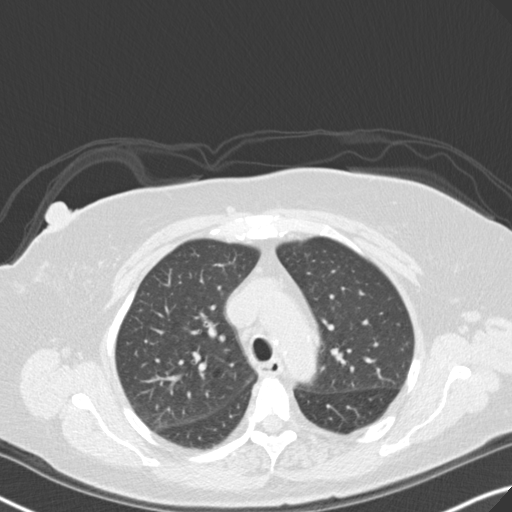
[im 98/126  lung]
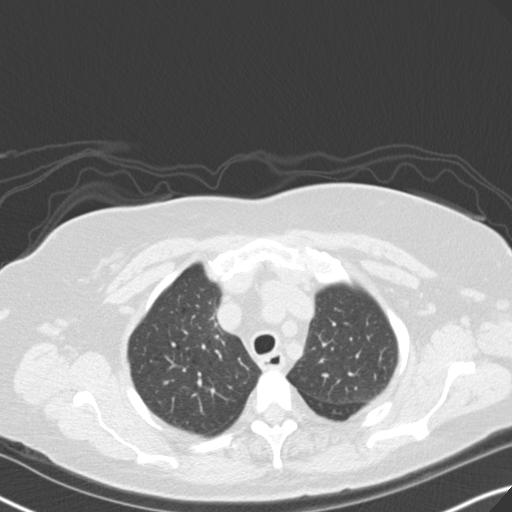
[im 107/126  lung]
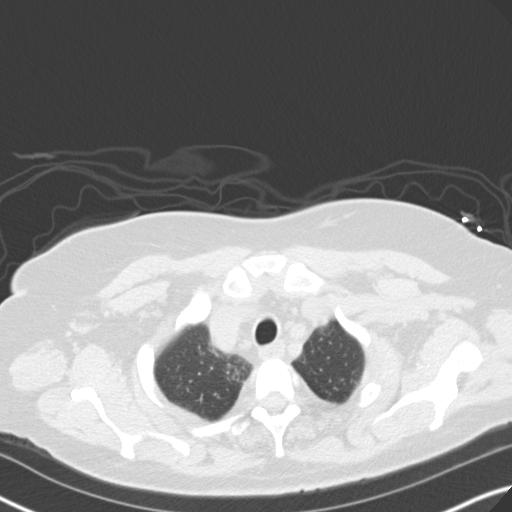
[im 116/126  lung]
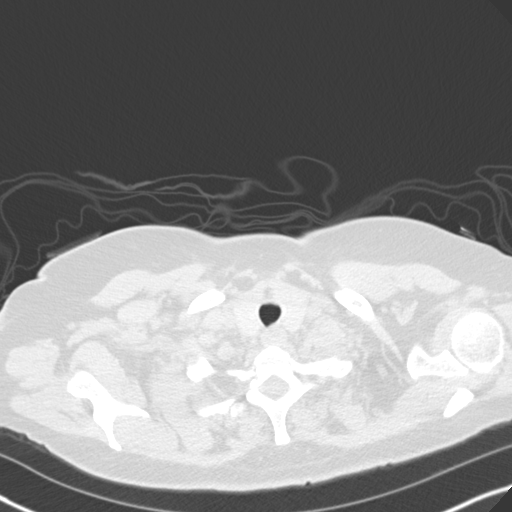

[Series 4: coronal · coronal · 0.50mm/px · 3 of 151 slices shown]
[im 31/151  lung]
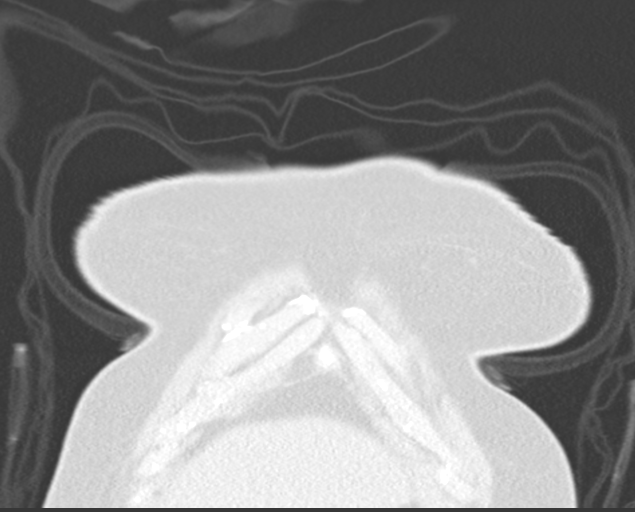
[im 61/151  lung]
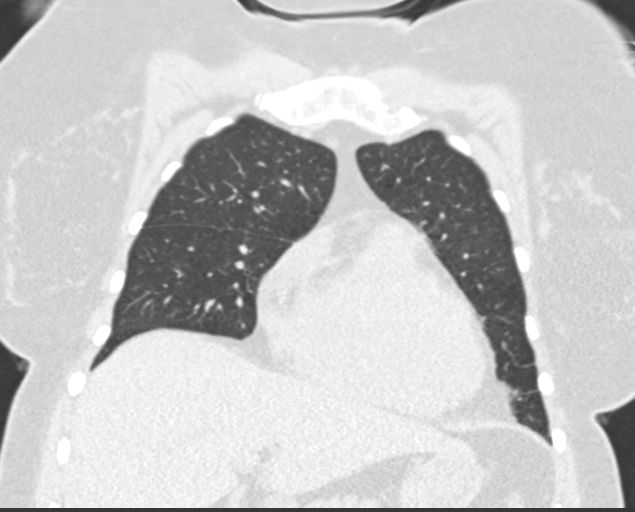
[im 91/151  lung]
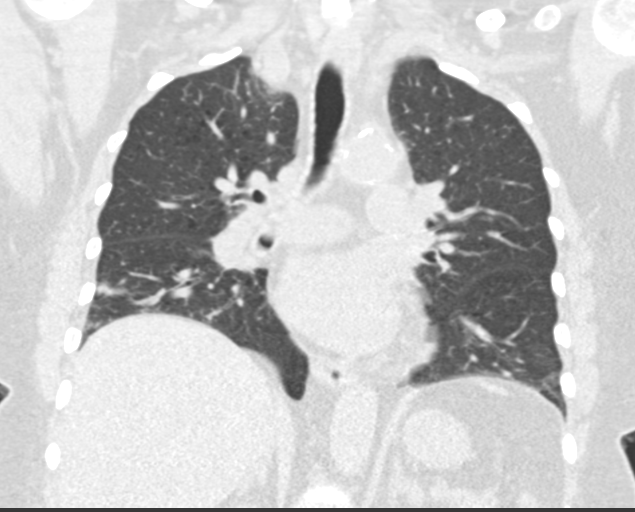

[15 of 36 positions shown; findings below may reference images not displayed]

FINDINGS: Cardiovascular: Mild atherosclerotic calcification in the aortic
arch. No evidence of aneurysm. Moderate coronary artery
calcification. Normal heart size. No pericardial effusion.

Mediastinum/Nodes: No enlarged mediastinal or axillary lymph nodes.
Thyroid gland, trachea, and esophagus demonstrate no significant
findings.

Lungs/Pleura: Mild centrilobular and paraseptal emphysema. The
central airways are patent. Mild opacities in the bilateral lung
bases likely reflect atelectasis or scarring. No focal pulmonary
opacity. There is a 3 mm right perifissural nodule (series 6, image
58). No other nodules identified. No pneumothorax or pleural
effusion.

Upper Abdomen: No acute abnormality.

Musculoskeletal: No chest wall mass or suspicious bone lesions
identified.
IMPRESSION: 1. There is a 3 mm right perifissural nodule. No follow-up needed if
patient is low-risk. Non-contrast chest CT can be considered in 12
months if patient is high-risk. This recommendation follows the
consensus statement: Guidelines for Management of Incidental
Pulmonary Nodules Detected on CT Images: From the [HOSPITAL]
2. Mild emphysema. Mild bibasilar opacities likely atelectasis or
scarring.
3. Moderate coronary artery calcification.

Aortic Atherosclerosis (P0C4Y-EV6.6) and Emphysema (P0C4Y-64Y.E).

## 2020-08-09 DIAGNOSIS — Z23 Encounter for immunization: Secondary | ICD-10-CM | POA: Diagnosis not present

## 2020-08-09 DIAGNOSIS — E119 Type 2 diabetes mellitus without complications: Secondary | ICD-10-CM | POA: Diagnosis not present

## 2020-08-09 DIAGNOSIS — I10 Essential (primary) hypertension: Secondary | ICD-10-CM | POA: Diagnosis not present

## 2020-08-09 DIAGNOSIS — Z720 Tobacco use: Secondary | ICD-10-CM | POA: Diagnosis not present

## 2020-08-15 DIAGNOSIS — I10 Essential (primary) hypertension: Secondary | ICD-10-CM | POA: Diagnosis not present

## 2020-08-15 DIAGNOSIS — Z0001 Encounter for general adult medical examination with abnormal findings: Secondary | ICD-10-CM | POA: Diagnosis not present

## 2020-08-15 DIAGNOSIS — E119 Type 2 diabetes mellitus without complications: Secondary | ICD-10-CM | POA: Diagnosis not present

## 2020-08-15 DIAGNOSIS — Z720 Tobacco use: Secondary | ICD-10-CM | POA: Diagnosis not present

## 2020-12-14 ENCOUNTER — Emergency Department (HOSPITAL_COMMUNITY): Payer: Medicare Other

## 2020-12-14 ENCOUNTER — Encounter (HOSPITAL_COMMUNITY): Payer: Self-pay | Admitting: Emergency Medicine

## 2020-12-14 ENCOUNTER — Inpatient Hospital Stay (HOSPITAL_COMMUNITY)
Admission: EM | Admit: 2020-12-14 | Discharge: 2020-12-28 | DRG: 025 | Disposition: A | Discharge status: Home Health | Payer: Medicare Other | Attending: Neurosurgery | Admitting: Neurosurgery

## 2020-12-14 ENCOUNTER — Other Ambulatory Visit: Payer: Self-pay

## 2020-12-14 DIAGNOSIS — D320 Benign neoplasm of cerebral meninges: Secondary | ICD-10-CM | POA: Diagnosis not present

## 2020-12-14 DIAGNOSIS — R0902 Hypoxemia: Secondary | ICD-10-CM

## 2020-12-14 DIAGNOSIS — E1159 Type 2 diabetes mellitus with other circulatory complications: Secondary | ICD-10-CM | POA: Diagnosis not present

## 2020-12-14 DIAGNOSIS — G9341 Metabolic encephalopathy: Secondary | ICD-10-CM | POA: Diagnosis not present

## 2020-12-14 DIAGNOSIS — Z6827 Body mass index (BMI) 27.0-27.9, adult: Secondary | ICD-10-CM

## 2020-12-14 DIAGNOSIS — I10 Essential (primary) hypertension: Secondary | ICD-10-CM | POA: Diagnosis not present

## 2020-12-14 DIAGNOSIS — E785 Hyperlipidemia, unspecified: Secondary | ICD-10-CM | POA: Diagnosis not present

## 2020-12-14 DIAGNOSIS — I152 Hypertension secondary to endocrine disorders: Secondary | ICD-10-CM | POA: Diagnosis not present

## 2020-12-14 DIAGNOSIS — E119 Type 2 diabetes mellitus without complications: Secondary | ICD-10-CM | POA: Diagnosis not present

## 2020-12-14 DIAGNOSIS — R4020 Unspecified coma: Secondary | ICD-10-CM | POA: Diagnosis not present

## 2020-12-14 DIAGNOSIS — Z20822 Contact with and (suspected) exposure to covid-19: Secondary | ICD-10-CM | POA: Diagnosis not present

## 2020-12-14 DIAGNOSIS — H4902 Third [oculomotor] nerve palsy, left eye: Secondary | ICD-10-CM | POA: Diagnosis not present

## 2020-12-14 DIAGNOSIS — E663 Overweight: Secondary | ICD-10-CM | POA: Diagnosis present

## 2020-12-14 DIAGNOSIS — F209 Schizophrenia, unspecified: Secondary | ICD-10-CM | POA: Diagnosis not present

## 2020-12-14 DIAGNOSIS — R531 Weakness: Secondary | ICD-10-CM | POA: Diagnosis not present

## 2020-12-14 DIAGNOSIS — J90 Pleural effusion, not elsewhere classified: Secondary | ICD-10-CM | POA: Diagnosis not present

## 2020-12-14 DIAGNOSIS — J9601 Acute respiratory failure with hypoxia: Secondary | ICD-10-CM | POA: Diagnosis not present

## 2020-12-14 DIAGNOSIS — Q382 Macroglossia: Secondary | ICD-10-CM | POA: Diagnosis not present

## 2020-12-14 DIAGNOSIS — Z781 Physical restraint status: Secondary | ICD-10-CM

## 2020-12-14 DIAGNOSIS — I672 Cerebral atherosclerosis: Secondary | ICD-10-CM | POA: Diagnosis not present

## 2020-12-14 DIAGNOSIS — I6031 Nontraumatic subarachnoid hemorrhage from right posterior communicating artery: Secondary | ICD-10-CM | POA: Diagnosis not present

## 2020-12-14 DIAGNOSIS — N3582 Other urethral stricture, female: Secondary | ICD-10-CM | POA: Diagnosis present

## 2020-12-14 DIAGNOSIS — I6782 Cerebral ischemia: Secondary | ICD-10-CM | POA: Diagnosis not present

## 2020-12-14 DIAGNOSIS — I700 Atherosclerosis of aorta: Secondary | ICD-10-CM | POA: Diagnosis not present

## 2020-12-14 DIAGNOSIS — Z978 Presence of other specified devices: Secondary | ICD-10-CM

## 2020-12-14 DIAGNOSIS — G588 Other specified mononeuropathies: Secondary | ICD-10-CM | POA: Diagnosis not present

## 2020-12-14 DIAGNOSIS — I671 Cerebral aneurysm, nonruptured: Principal | ICD-10-CM | POA: Diagnosis present

## 2020-12-14 DIAGNOSIS — F17210 Nicotine dependence, cigarettes, uncomplicated: Secondary | ICD-10-CM | POA: Diagnosis not present

## 2020-12-14 DIAGNOSIS — H4900 Third [oculomotor] nerve palsy, unspecified eye: Secondary | ICD-10-CM | POA: Diagnosis not present

## 2020-12-14 DIAGNOSIS — D329 Benign neoplasm of meninges, unspecified: Secondary | ICD-10-CM | POA: Diagnosis not present

## 2020-12-14 DIAGNOSIS — J9600 Acute respiratory failure, unspecified whether with hypoxia or hypercapnia: Secondary | ICD-10-CM | POA: Diagnosis not present

## 2020-12-14 DIAGNOSIS — G9389 Other specified disorders of brain: Secondary | ICD-10-CM | POA: Diagnosis not present

## 2020-12-14 DIAGNOSIS — E1141 Type 2 diabetes mellitus with diabetic mononeuropathy: Secondary | ICD-10-CM | POA: Diagnosis present

## 2020-12-14 DIAGNOSIS — Z8679 Personal history of other diseases of the circulatory system: Secondary | ICD-10-CM | POA: Diagnosis not present

## 2020-12-14 DIAGNOSIS — Z4682 Encounter for fitting and adjustment of non-vascular catheter: Secondary | ICD-10-CM | POA: Diagnosis not present

## 2020-12-14 DIAGNOSIS — L8990 Pressure ulcer of unspecified site, unspecified stage: Secondary | ICD-10-CM | POA: Insufficient documentation

## 2020-12-14 DIAGNOSIS — I739 Peripheral vascular disease, unspecified: Secondary | ICD-10-CM | POA: Diagnosis not present

## 2020-12-14 DIAGNOSIS — Z4659 Encounter for fitting and adjustment of other gastrointestinal appliance and device: Secondary | ICD-10-CM

## 2020-12-14 DIAGNOSIS — Z452 Encounter for adjustment and management of vascular access device: Secondary | ICD-10-CM | POA: Diagnosis not present

## 2020-12-14 DIAGNOSIS — H539 Unspecified visual disturbance: Secondary | ICD-10-CM | POA: Diagnosis not present

## 2020-12-14 LAB — DIFFERENTIAL
Abs Immature Granulocytes: 0.02 10*3/uL (ref 0.00–0.07)
Basophils Absolute: 0.1 10*3/uL (ref 0.0–0.1)
Basophils Relative: 1 %
Eosinophils Absolute: 0.2 10*3/uL (ref 0.0–0.5)
Eosinophils Relative: 2 %
Immature Granulocytes: 0 %
Lymphocytes Relative: 30 %
Lymphs Abs: 2.5 10*3/uL (ref 0.7–4.0)
Monocytes Absolute: 0.6 10*3/uL (ref 0.1–1.0)
Monocytes Relative: 7 %
Neutro Abs: 4.9 10*3/uL (ref 1.7–7.7)
Neutrophils Relative %: 60 %

## 2020-12-14 LAB — I-STAT CHEM 8, ED
BUN: 12 mg/dL (ref 8–23)
Calcium, Ion: 1.3 mmol/L (ref 1.15–1.40)
Chloride: 98 mmol/L (ref 98–111)
Creatinine, Ser: 0.9 mg/dL (ref 0.44–1.00)
Glucose, Bld: 96 mg/dL (ref 70–99)
HCT: 42 % (ref 36.0–46.0)
Hemoglobin: 14.3 g/dL (ref 12.0–15.0)
Potassium: 3.8 mmol/L (ref 3.5–5.1)
Sodium: 138 mmol/L (ref 135–145)
TCO2: 29 mmol/L (ref 22–32)

## 2020-12-14 LAB — APTT: aPTT: 28 seconds (ref 24–36)

## 2020-12-14 LAB — CBC
HCT: 39.7 % (ref 36.0–46.0)
Hemoglobin: 12.4 g/dL (ref 12.0–15.0)
MCH: 24.4 pg — ABNORMAL LOW (ref 26.0–34.0)
MCHC: 31.2 g/dL (ref 30.0–36.0)
MCV: 78 fL — ABNORMAL LOW (ref 80.0–100.0)
Platelets: 278 10*3/uL (ref 150–400)
RBC: 5.09 MIL/uL (ref 3.87–5.11)
RDW: 16.4 % — ABNORMAL HIGH (ref 11.5–15.5)
WBC: 8.2 10*3/uL (ref 4.0–10.5)
nRBC: 0 % (ref 0.0–0.2)

## 2020-12-14 LAB — COMPREHENSIVE METABOLIC PANEL
ALT: 10 U/L (ref 0–44)
AST: 14 U/L — ABNORMAL LOW (ref 15–41)
Albumin: 3.3 g/dL — ABNORMAL LOW (ref 3.5–5.0)
Alkaline Phosphatase: 92 U/L (ref 38–126)
Anion gap: 7 (ref 5–15)
BUN: 10 mg/dL (ref 8–23)
CO2: 27 mmol/L (ref 22–32)
Calcium: 9.2 mg/dL (ref 8.9–10.3)
Chloride: 101 mmol/L (ref 98–111)
Creatinine, Ser: 0.9 mg/dL (ref 0.44–1.00)
GFR, Estimated: >60 mL/min (ref >60)
Glucose, Bld: 101 mg/dL — ABNORMAL HIGH (ref 70–99)
Potassium: 3.9 mmol/L (ref 3.5–5.1)
Sodium: 135 mmol/L (ref 135–145)
Total Bilirubin: 0.5 mg/dL (ref 0.3–1.2)
Total Protein: 6.8 g/dL (ref 6.5–8.1)

## 2020-12-14 LAB — PROTIME-INR
INR: 1 (ref 0.8–1.2)
Prothrombin Time: 12.8 seconds (ref 11.4–15.2)

## 2020-12-14 LAB — CBG MONITORING, ED: Glucose-Capillary: 96 mg/dL (ref 70–99)

## 2020-12-14 MED ORDER — SENNA 8.6 MG PO TABS
1.0000 | ORAL_TABLET | Freq: Two times a day (BID) | ORAL | Status: DC
  Administered 2020-12-15 (×2): 8.6 mg via ORAL
  Filled 2020-12-14 (×2): qty 1

## 2020-12-14 MED ORDER — FENTANYL CITRATE (PF) 100 MCG/2ML IJ SOLN: 12.5000 ug | INTRAMUSCULAR | Status: DC | PRN

## 2020-12-14 MED ORDER — SODIUM CHLORIDE 0.9% FLUSH
3.0000 mL | Freq: Two times a day (BID) | INTRAVENOUS | Status: DC
  Administered 2020-12-15 – 2020-12-19 (×10): 3 mL via INTRAVENOUS

## 2020-12-14 MED ORDER — IRBESARTAN 150 MG PO TABS: 150.0000 mg | ORAL_TABLET | Freq: Every day | ORAL | Status: DC

## 2020-12-14 MED ORDER — MAGNESIUM CITRATE PO SOLN: 1.0000 | Freq: Once | ORAL | Status: DC | PRN

## 2020-12-14 MED ORDER — PANTOPRAZOLE SODIUM 40 MG PO TBEC
40.0000 mg | DELAYED_RELEASE_TABLET | Freq: Every day | ORAL | Status: DC
  Administered 2020-12-15: 40 mg via ORAL
  Filled 2020-12-14: qty 1

## 2020-12-14 MED ORDER — LABETALOL HCL 5 MG/ML IV SOLN
5.0000 mg | INTRAVENOUS | Status: DC | PRN
  Filled 2020-12-14: qty 4

## 2020-12-14 MED ORDER — OXYCODONE HCL 5 MG PO TABS
5.0000 mg | ORAL_TABLET | ORAL | Status: DC | PRN
Start: 2020-12-14 — End: 2020-12-15
  Administered 2020-12-15: 5 mg via ORAL
  Filled 2020-12-14: qty 1

## 2020-12-14 MED ORDER — PERPHENAZINE 4 MG PO TABS
8.0000 mg | ORAL_TABLET | Freq: Two times a day (BID) | ORAL | Status: DC
  Filled 2020-12-14: qty 1
  Filled 2020-12-14 (×2): qty 2
  Filled 2020-12-14: qty 1

## 2020-12-14 MED ORDER — ONDANSETRON HCL 4 MG/2ML IJ SOLN
4.0000 mg | Freq: Four times a day (QID) | INTRAMUSCULAR | Status: DC | PRN
  Administered 2020-12-15: 4 mg via INTRAVENOUS
  Filled 2020-12-14: qty 2

## 2020-12-14 MED ORDER — BENZTROPINE MESYLATE 1 MG PO TABS
1.0000 mg | ORAL_TABLET | Freq: Two times a day (BID) | ORAL | Status: DC
  Administered 2020-12-15 (×2): 1 mg via ORAL
  Filled 2020-12-14 (×3): qty 1

## 2020-12-14 MED ORDER — BISACODYL 5 MG PO TBEC: 5.0000 mg | DELAYED_RELEASE_TABLET | Freq: Every day | ORAL | Status: DC | PRN

## 2020-12-14 MED ORDER — ACETAMINOPHEN 325 MG PO TABS: 650.0000 mg | ORAL_TABLET | Freq: Four times a day (QID) | ORAL | Status: DC | PRN

## 2020-12-14 MED ORDER — METOPROLOL SUCCINATE ER 25 MG PO TB24
25.0000 mg | ORAL_TABLET | Freq: Every day | ORAL | Status: DC
  Administered 2020-12-15: 25 mg via ORAL
  Filled 2020-12-14: qty 1

## 2020-12-14 MED ORDER — ONDANSETRON HCL 4 MG PO TABS: 4.0000 mg | ORAL_TABLET | Freq: Four times a day (QID) | ORAL | Status: DC | PRN

## 2020-12-14 MED ORDER — ACETAMINOPHEN 650 MG RE SUPP: 650.0000 mg | Freq: Four times a day (QID) | RECTAL | Status: DC | PRN

## 2020-12-14 MED ORDER — INSULIN ASPART 100 UNIT/ML ~~LOC~~ SOLN
0.0000 [IU] | SUBCUTANEOUS | Status: DC
  Administered 2020-12-15: 3 [IU] via SUBCUTANEOUS
  Administered 2020-12-16 (×2): 2 [IU] via SUBCUTANEOUS
  Administered 2020-12-17 (×2): 3 [IU] via SUBCUTANEOUS
  Administered 2020-12-17 – 2020-12-18 (×5): 2 [IU] via SUBCUTANEOUS
  Administered 2020-12-19: 3 [IU] via SUBCUTANEOUS
  Administered 2020-12-19: 2 [IU] via SUBCUTANEOUS

## 2020-12-14 MED ORDER — SODIUM CHLORIDE 0.9% FLUSH
3.0000 mL | Freq: Once | INTRAVENOUS | Status: AC
  Administered 2020-12-14: 3 mL via INTRAVENOUS

## 2020-12-14 MED ORDER — POTASSIUM CHLORIDE IN NACL 20-0.9 MEQ/L-% IV SOLN
INTRAVENOUS | Status: DC
  Filled 2020-12-14: qty 1000

## 2020-12-14 MED ORDER — HYDROCHLOROTHIAZIDE 12.5 MG PO CAPS
12.5000 mg | ORAL_CAPSULE | Freq: Every day | ORAL | Status: DC
Start: 2020-12-15 — End: 2020-12-15
  Administered 2020-12-15: 12.5 mg via ORAL
  Filled 2020-12-14: qty 1

## 2020-12-14 MED ORDER — IOHEXOL 350 MG/ML SOLN
75.0000 mL | Freq: Once | INTRAVENOUS | Status: AC | PRN
  Administered 2020-12-14: 75 mL via INTRAVENOUS

## 2020-12-14 MED ORDER — AMLODIPINE BESYLATE 5 MG PO TABS
5.0000 mg | ORAL_TABLET | Freq: Every day | ORAL | Status: DC
  Administered 2020-12-15: 5 mg via ORAL
  Filled 2020-12-14: qty 1

## 2020-12-14 MED ORDER — LISINOPRIL 10 MG PO TABS
10.0000 mg | ORAL_TABLET | ORAL | Status: DC
  Administered 2020-12-15: 10 mg via ORAL
  Filled 2020-12-14: qty 1

## 2020-12-14 MED ORDER — TRAZODONE HCL 50 MG PO TABS
150.0000 mg | ORAL_TABLET | Freq: Every day | ORAL | Status: DC
  Administered 2020-12-15: 150 mg via ORAL
  Filled 2020-12-14: qty 3

## 2020-12-14 MED ORDER — PIOGLITAZONE HCL 15 MG PO TABS
15.0000 mg | ORAL_TABLET | Freq: Every day | ORAL | Status: DC
  Filled 2020-12-14: qty 1

## 2020-12-14 MED ORDER — SIMVASTATIN 20 MG PO TABS
40.0000 mg | ORAL_TABLET | Freq: Every day | ORAL | Status: DC
  Administered 2020-12-15: 40 mg via ORAL
  Filled 2020-12-14: qty 2

## 2020-12-14 MED ORDER — SENNOSIDES-DOCUSATE SODIUM 8.6-50 MG PO TABS: 1.0000 | ORAL_TABLET | Freq: Every evening | ORAL | Status: DC | PRN

## 2020-12-14 NOTE — ED Triage Notes (Addendum)
Patient sent to ED by ophthalmologist for evaluation of left eye, note from ophthalmologist states concern of PCA aneurysm. Patient states symptoms started one week ago but was unable to arrange transportation to have eye evaluated. Patient has no other deficits, reports 8/10 pain behind left eye. Patient is alert, oriented, and in no apparent distress at this time.

## 2020-12-14 NOTE — H&P (Signed)
Shannon [name] is an 70 y.o. female.   Chief Complaint: left lllrd nerve palsy, left posterior communicating artery aneurysm, right posterior fossa meningioma HPI: Shannon [name] was in her usual state of health until approximately 5 days ago when she noticed double vision. Was able to see the ophthalmologist today whom when he identified the left lllrd nerve palsy sent her immediately to the ED to obtain an MRI for suspicion of an aneurysm. She describes a headache right behind her left eye during this period of time. CT angio identified a left pcomm aneurysm, and a right posterior fossa meningioma.  Past Medical History:  Diagnosis Date  . Anxiety   . Depression   . Diabetes mellitus   . Hyperlipidemia   . Hypertension   . Schizophrenia [facility])     Past Surgical History:  Procedure Laterality Date  . BREAST SURGERY    . EXPLORATORY LAPAROTOMY      Family History  Problem Relation Age of Onset  . Kidney disease Mother    Social History:  reports that she has been smoking cigarettes. She has a 40.00 pack-year smoking history. She has never used smokeless tobacco. She reports current alcohol use of about 1.0 standard drink of alcohol per week. She reports that she does not use drugs.  Allergies: No Known Allergies  (Not in a hospital admission)   Results for orders placed or performed during the hospital encounter of 12/14/20 (from the past 48 hour(s))  Protime-INR     Status: None   Collection Time: 12/14/20  6:35 PM  Result Value Ref Range   Prothrombin Time 12.8 11.4 - 15.2 seconds   INR 1.0 0.8 - 1.2    Comment: (NOTE) INR goal varies based on device and disease states. Performed at [facility], [place] [address], [place], [place] [address]   APTT     Status: None   Collection Time: 12/14/20  6:35 PM  Result Value Ref Range   aPTT 28 24 - 36 seconds    Comment: Performed at [facility] [address], [place], [place] [address]  CBC     Status: Abnormal    Collection Time: 12/14/20  6:35 PM  Result Value Ref Range   WBC 8.2 4.0 - 10.5 K/uL   RBC 5.09 3.87 - 5.11 MIL/uL   Hemoglobin 12.4 12.0 - 15.0 g/dL   HCT 39.7 36.0 - 46.0 %   MCV 78.0 (L) 80.0 - 100.0 fL   MCH 24.4 (L) 26.0 - 34.0 pg   MCHC 31.2 30.0 - 36.0 g/dL   RDW 16.4 (H) 11.5 - 15.5 %   Platelets 278 150 - 400 K/uL   nRBC 0.0 0.0 - 0.2 %    Comment: Performed at [facility], [place] [address] [place], [place] [address]  Differential     Status: None   Collection Time: 12/14/20  6:35 PM  Result Value Ref Range   Neutrophils Relative % 60 %   Neutro Abs 4.9 1.7 - 7.7 K/uL   Lymphocytes Relative 30 %   Lymphs Abs 2.5 0.7 - 4.0 K/uL   Monocytes Relative 7 %   Monocytes Absolute 0.6 0.1 - 1.0 K/uL   Eosinophils Relative 2 %   Eosinophils Absolute 0.2 0.0 - 0.5 K/uL   Basophils Relative 1 %   Basophils Absolute 0.1 0.0 - 0.1 K/uL   Immature Granulocytes 0 %   Abs Immature Granulocytes 0.02 0.00 - 0.07 K/uL  Comment: Performed at [facility], [place] [address] [place], [place] [address]  Comprehensive metabolic panel     Status: Abnormal   Collection Time: 12/14/20  6:35 PM  Result Value Ref Range   Sodium 135 135 - 145 mmol/L   Potassium 3.9 3.5 - 5.1 mmol/L   Chloride 101 98 - 111 mmol/L   CO2 27 22 - 32 mmol/L   Glucose, Bld 101 (H) 70 - 99 mg/dL    Comment: Glucose reference range applies only to samples taken after fasting for at least 8 hours.   BUN 10 8 - 23 mg/dL   Creatinine, Ser 0.90 0.44 - 1.00 mg/dL   Calcium 9.2 8.9 - 10.3 mg/dL   Total Protein 6.8 6.5 - 8.1 g/dL   Albumin 3.3 (L) 3.5 - 5.0 g/dL   AST 14 (L) 15 - 41 U/L   ALT 10 0 - 44 U/L   Alkaline Phosphatase 92 38 - 126 U/L   Total Bilirubin 0.5 0.3 - 1.2 mg/dL   GFR, Estimated >60 >60 mL/min    Comment: (NOTE) Calculated using the CKD-EPI Creatinine Equation (2021)    Anion gap 7 5 - 15    Comment: Performed at [facility], [place] [address], [place], [place] [address]  I-stat  chem 8, ED     Status: None   Collection Time: 12/14/20  7:02 PM  Result Value Ref Range   Sodium 138 135 - 145 mmol/L   Potassium 3.8 3.5 - 5.1 mmol/L   Chloride 98 98 - 111 mmol/L   BUN 12 8 - 23 mg/dL   Creatinine, Ser 0.90 0.44 - 1.00 mg/dL   Glucose, Bld 96 70 - 99 mg/dL    Comment: Glucose reference range applies only to samples taken after fasting for at least 8 hours.   Calcium, Ion 1.30 1.15 - 1.40 mmol/L   TCO2 29 22 - 32 mmol/L   Hemoglobin 14.3 12.0 - 15.0 g/dL   HCT 42.0 36.0 - 46.0 %   CT Angio Head W or Wo Contrast  Result Date: 12/14/2020 CLINICAL DATA:  Third cranial nerve palsy EXAM: CT ANGIOGRAPHY HEAD AND NECK TECHNIQUE: Multidetector CT imaging of the head and neck was performed using the standard protocol during bolus administration of intravenous contrast. Multiplanar CT image reconstructions and MIPs were obtained to evaluate the vascular anatomy. Carotid stenosis measurements (when applicable) are obtained utilizing NASCET criteria, using the distal internal carotid diameter as the denominator. CONTRAST:  4mL OMNIPAQUE IOHEXOL 350 MG/ML SOLN COMPARISON:  None. FINDINGS: CTA NECK FINDINGS SKELETON: There is no bony spinal canal stenosis. No lytic or blastic lesion. OTHER NECK: Normal pharynx, larynx and major salivary glands. No cervical lymphadenopathy. Unremarkable thyroid gland. UPPER CHEST: No pneumothorax or pleural effusion. No nodules or masses. AORTIC ARCH: There is calcific atherosclerosis of the aortic arch. There is no aneurysm, dissection or hemodynamically significant stenosis of the visualized portion of the aorta. Conventional 3 vessel aortic branching pattern. The visualized proximal subclavian arteries are widely patent. RIGHT CAROTID SYSTEM: Normal without aneurysm, dissection or stenosis. LEFT CAROTID SYSTEM: No dissection, occlusion or aneurysm. There is mixed density atherosclerosis extending into the proximal ICA, resulting in less than 50% stenosis.  VERTEBRAL ARTERIES: Left dominant configuration. Both origins are clearly patent. There is no dissection, occlusion or flow-limiting stenosis to the skull base (V1-V3 segments). CTA HEAD FINDINGS POSTERIOR CIRCULATION: --Vertebral arteries: Normal V4 segments. --Inferior cerebellar arteries: Normal. --Basilar artery: Normal. --Superior cerebellar arteries: Normal. --  Posterior cerebral arteries (PCA): Normal. ANTERIOR CIRCULATION: --Intracranial internal carotid arteries: There is an aneurysm of the left P-comm origin that measures 3 x 3 mm. --Anterior cerebral arteries (ACA): Normal. Both A1 segments are present. Patent anterior communicating artery (a-comm). --Middle cerebral arteries (MCA): Normal. VENOUS SINUSES: As permitted by contrast timing, patent. ANATOMIC VARIANTS: None Review of the MIP images confirms the above findings. IMPRESSION: 1. 3 x 3 mm left P-comm origin aneurysm. 2. No intracranial arterial occlusion or high-grade stenosis. Aortic Atherosclerosis (ICD10-I70.0). Electronically Signed   By: Ulyses Jarred M.D.   On: 12/14/2020 21:04   CT HEAD WO CONTRAST  Result Date: 12/14/2020 CLINICAL DATA:  Visual disturbance. Abnormal left eye ophthalmological exam. EXAM: CT HEAD WITHOUT CONTRAST TECHNIQUE: Contiguous axial images were obtained from the base of the skull through the vertex without intravenous contrast. COMPARISON:  10/27/2012.  MRI 12/09/2009 FINDINGS: Brain: 2.6 x 3.2 cm in diameter extra-axial mass consistent with meningioma in the far right lateral posterior fossa. This indents the lateral aspect of the cerebellar hemisphere but is not associated with any brain edema or significant affect upon the fourth ventricle. There is a broad surface along the sigmoid sinus and sigmoid sinus invasion/occlusion is possible. Cerebral hemispheres show old small vessel infarctions in both thalami and mild chronic small-vessel ischemic changes elsewhere within the hemispheric white matter. No  supratentorial mass lesion. No hemorrhage, hydrocephalus or extra-axial collection. Vascular: There is atherosclerotic calcification of the major vessels at the base of the brain. Skull: No skull fracture. Sinuses/Orbits: Sinuses are clear. Old left medial orbital blowout fracture. No acute orbital finding. Patient does appear to have some degree of divergent gaze. Other: None IMPRESSION: 1. 2.6 x 3.2 cm in diameter extra-axial mass in the far right lateral posterior fossa consistent with meningioma. This indents the lateral aspect of the cerebellar hemisphere but is not associated with any brain edema or significant affect upon the fourth ventricle. There is a broad surface along the sigmoid sinus and sigmoid sinus invasion/occlusion is possible. This was seen on the MRI from 20/11 at which time it measured 15 x 19 mm. 2. Old small vessel infarctions of both thalami and mild chronic small-vessel ischemic changes elsewhere within the hemispheric white matter. 3. Old left medial orbital blowout fracture. 4. Patient does appear to have some degree of divergent gaze. Electronically Signed   By: Nelson Chimes M.D.   On: 12/14/2020 19:24   CT Angio Neck W and/or Wo Contrast  Result Date: 12/14/2020 CLINICAL DATA:  Third cranial nerve palsy EXAM: CT ANGIOGRAPHY HEAD AND NECK TECHNIQUE: Multidetector CT imaging of the head and neck was performed using the standard protocol during bolus administration of intravenous contrast. Multiplanar CT image reconstructions and MIPs were obtained to evaluate the vascular anatomy. Carotid stenosis measurements (when applicable) are obtained utilizing NASCET criteria, using the distal internal carotid diameter as the denominator. CONTRAST:  87mL OMNIPAQUE IOHEXOL 350 MG/ML SOLN COMPARISON:  None. FINDINGS: CTA NECK FINDINGS SKELETON: There is no bony spinal canal stenosis. No lytic or blastic lesion. OTHER NECK: Normal pharynx, larynx and major salivary glands. No cervical  lymphadenopathy. Unremarkable thyroid gland. UPPER CHEST: No pneumothorax or pleural effusion. No nodules or masses. AORTIC ARCH: There is calcific atherosclerosis of the aortic arch. There is no aneurysm, dissection or hemodynamically significant stenosis of the visualized portion of the aorta. Conventional 3 vessel aortic branching pattern. The visualized proximal subclavian arteries are widely patent. RIGHT CAROTID SYSTEM: Normal without aneurysm, dissection or stenosis. LEFT CAROTID  SYSTEM: No dissection, occlusion or aneurysm. There is mixed density atherosclerosis extending into the proximal ICA, resulting in less than 50% stenosis. VERTEBRAL ARTERIES: Left dominant configuration. Both origins are clearly patent. There is no dissection, occlusion or flow-limiting stenosis to the skull base (V1-V3 segments). CTA HEAD FINDINGS POSTERIOR CIRCULATION: --Vertebral arteries: Normal V4 segments. --Inferior cerebellar arteries: Normal. --Basilar artery: Normal. --Superior cerebellar arteries: Normal. --Posterior cerebral arteries (PCA): Normal. ANTERIOR CIRCULATION: --Intracranial internal carotid arteries: There is an aneurysm of the left P-comm origin that measures 3 x 3 mm. --Anterior cerebral arteries (ACA): Normal. Both A1 segments are present. Patent anterior communicating artery (a-comm). --Middle cerebral arteries (MCA): Normal. VENOUS SINUSES: As permitted by contrast timing, patent. ANATOMIC VARIANTS: None Review of the MIP images confirms the above findings. IMPRESSION: 1. 3 x 3 mm left P-comm origin aneurysm. 2. No intracranial arterial occlusion or high-grade stenosis. Aortic Atherosclerosis (ICD10-I70.0). Electronically Signed   By: Ulyses Jarred M.D.   On: 12/14/2020 21:04   MR BRAIN WO CONTRAST  Result Date: 12/14/2020 CLINICAL DATA:  Left eye changes with aneurysm EXAM: MRI HEAD WITHOUT CONTRAST TECHNIQUE: Multiplanar, multiecho pulse sequences of the brain and surrounding structures were obtained  without intravenous contrast. COMPARISON:  None. FINDINGS: Brain: No acute infarct, mass effect or extra-axial collection. No acute or chronic hemorrhage. There is multifocal hyperintense T2-weighted signal within the white matter. Parenchymal volume and CSF spaces are normal. There is a right posterior fossa meningioma measuring 3.0 x 2.0 cm. The midline structures are normal. Vascular: Major flow voids are preserved. Skull and upper cervical spine: Normal calvarium and skull base. Visualized upper cervical spine and soft tissues are normal. Sinuses/Orbits:No paranasal sinus fluid levels or advanced mucosal thickening. No mastoid or middle ear effusion. Normal orbits. IMPRESSION: 1. No acute intracranial abnormality. 2. Right posterior fossa meningioma. 3. Findings of mild chronic small vessel disease. Electronically Signed   By: Ulyses Jarred M.D.   On: 12/14/2020 22:00    Review of Systems  Constitutional: Negative.   HENT:       Headache  Eyes: Positive for pain.       Diplopia  Respiratory: Negative.   Cardiovascular: Negative.   Gastrointestinal: Negative.   Endocrine: Negative.   Genitourinary: Negative.   Musculoskeletal: Negative.   Skin: Negative.   Neurological: Positive for headaches.  Hematological: Negative.   Psychiatric/Behavioral: Negative.     Blood pressure (!) 163/79, pulse 60, temperature 98 F (36.7 C), temperature source Oral, resp. rate 18, SpO2 100 %. Physical Exam Constitutional:      General: She is not in acute distress.    Appearance: Normal appearance. She is normal weight. She is not ill-appearing, toxic-appearing or diaphoretic.  HENT:     Head: Normocephalic and atraumatic.     Right Ear: External ear normal.     Left Ear: External ear normal.     Nose: Nose normal.     Mouth/Throat:     Mouth: Mucous membranes are moist.     Pharynx: Oropharynx is clear.  Eyes:     Extraocular Movements:     Right eye: Normal extraocular motion.     Left eye:  Abnormal extraocular motion present.     Comments: Left eye is ptotic Left pupil larger than right Diplopia right lateral gaze, up gaze,  Left 3rd nerve palsy  Neurological:     Mental Status: She is alert and oriented to person, place, and time.     Sensory: Sensation is intact.  Motor: Motor function is intact.     Coordination: Coordination is intact.     Gait: Gait is intact.     Comments: Alert and oriented x4, speech is clear and fluent No drift Moving all extremities well       Assessment/Plan KATASHA RIGA is a 70 y.o. female with a new lllrd nerve palsy and an aneurysm in the left pcomm. She will be admitted, undergo a catheter angiogram and definitive treatment.   Ashok Pall, MD 12/14/2020, 11:39 PM

## 2020-12-14 NOTE — ED Provider Notes (Signed)
[facility] EMERGENCY DEPARTMENT Provider Note   CSN: [id number] Arrival date & time: 12/14/20  1800     History Chief Complaint  Patient presents with  . Diplopia    Shannon [name] is a 70 y.o. female.  Presents to ER with concern for diplopia.  Patient reports on Saturday she woke up with double vision.  Also states she has been having some mild pain on the left side of her head, left eye.  Went to her ophthalmologist today and was told to go to ER for further evaluation.  Per ophthalmology notes, concern for 3rd nerve palsy, recommending imaging to rule out PCA aneurysm.  Patient denies any generalized weakness, numbness, speech or vision change besides the double vision.  HPI     Past Medical History:  Diagnosis Date  . Anxiety   . Depression   . Diabetes mellitus   . Hyperlipidemia   . Hypertension   . Schizophrenia [facility])     Patient Active Problem List   Diagnosis Date Noted  . Controlled type 2 diabetes mellitus without complication, without long-term current use of insulin ([place]) 11/24/2019  . Homeless 10/30/2012  . Schizophrenia, paranoid type ([place]) 03/03/2012  . Post-menopause 10/03/2011  . Vaginal irritation 10/03/2011    Past Surgical History:  Procedure Laterality Date  . BREAST SURGERY    . EXPLORATORY LAPAROTOMY       OB History    Gravida  0   Para  0   Term  0   Preterm  0   AB  0   Living  0     SAB  0   IAB  0   Ectopic  0   Multiple  0   Live Births              Family History  Problem Relation Age of Onset  . Kidney disease Mother     Social History   Tobacco Use  . Smoking status: Current Every Day Smoker    Packs/day: 1.00    Years: 40.00    Pack years: 40.00    Types: Cigarettes  . Smokeless tobacco: Never Used  Substance Use Topics  . Alcohol use: Yes    Alcohol/week: 1.0 standard drink    Types: 1 Cans of beer per week  . Drug use: No    Home Medications Prior to Admission  medications   Medication Sig Start Date End Date Taking? Authorizing Provider  amLODipine (NORVASC) 5 MG tablet Take 1 tablet (5 mg total) by mouth daily. For hypertension. 11/04/12   Mashburn, Marlane Hatcher, PA-C  BENICAR 20 MG tablet Take 1 tablet by mouth daily. 04/28/13   [provider]  benztropine (COGENTIN) 1 MG tablet Take 1 tablet (1 mg total) by mouth 2 (two) times daily. For side effects. 11/04/12   Ruben Im, PA-C  hydrochlorothiazide (MICROZIDE) 12.5 MG capsule Take 1 capsule (12.5 mg total) by mouth daily. For hypertension. 11/04/12   Ruben Im, PA-C  irbesartan (AVAPRO) 150 MG tablet Take 1 tablet (150 mg total) by mouth daily. For hypertension. 11/04/12   Nena Polio T, PA-C  lisinopril (PRINIVIL,ZESTRIL) 10 MG tablet Take 10 mg by mouth every morning.    [provider]  metoprolol succinate (TOPROL-XL) 25 MG 24 hr tablet Take 25 mg by mouth daily.    [provider]  pantoprazole (PROTONIX) 40 MG tablet Take 1 tablet (40 mg total) by mouth daily. For reflux 11/04/12  Nena Polio T, PA-C  perphenazine (TRILAFON) 8 MG tablet Take 1 tablet (8 mg total) by mouth 2 (two) times daily. 11/04/12   Nena Polio T, PA-C  pioglitazone (ACTOS) 15 MG tablet Take 1 tablet (15 mg total) by mouth daily. For glycemic control. 11/04/12   Ruben Im, PA-C  simvastatin (ZOCOR) 40 MG tablet Take 1 tablet by mouth daily. 05/22/13   [provider]  traZODone (DESYREL) 150 MG tablet Take 1 tablet (150 mg total) by mouth at bedtime. For insomnia 11/04/12   Ruben Im, PA-C    Allergies    Patient has no known allergies.  Review of Systems   Review of Systems  Constitutional: Negative for chills and fever.  HENT: Negative for ear pain and sore throat.   Eyes: Negative for pain and visual disturbance.  Respiratory: Negative for cough and shortness of breath.   Cardiovascular: Negative for chest pain and palpitations.  Gastrointestinal: Negative  for abdominal pain and vomiting.  Genitourinary: Negative for dysuria and hematuria.  Musculoskeletal: Negative for arthralgias and back pain.  Skin: Negative for color change and rash.  Neurological: Negative for seizures and syncope.  All other systems reviewed and are negative.   Physical Exam Updated Vital Signs BP (!) 163/79   Pulse 60   Temp 98 F (36.7 C) (Oral)   Resp 18   SpO2 100%   Physical Exam Vitals and nursing note reviewed.  Constitutional:      General: She is not in acute distress.    Appearance: She is well-developed and well-nourished.  HENT:     Head: Normocephalic and atraumatic.  Eyes:     Conjunctiva/sclera: Conjunctivae normal.  Cardiovascular:     Rate and Rhythm: Normal rate and regular rhythm.     Heart sounds: No murmur heard.   Pulmonary:     Effort: Pulmonary effort is normal. No respiratory distress.     Breath sounds: Normal breath sounds.  Abdominal:     Palpations: Abdomen is soft.     Tenderness: There is no abdominal tenderness.  Musculoskeletal:        General: No deformity, signs of injury or edema.     Cervical back: Neck supple.  Skin:    General: Skin is warm and dry.     Capillary Refill: Capillary refill takes less than 2 seconds.  Neurological:     Mental Status: She is alert.     Comments:  Alert, oriented x3 Speech clear CN exam: complete L CN III palsy, L eye deviates down and out, left pupil is dilated 35mm, more sluggishly reactive; visual fields are intact  Strength: 5 out of 5 strength in upper and lower extremities Sensation: Sensation to light touch intact in upper and lower extremities  Normal gait  Psychiatric:        Mood and Affect: Mood and affect normal.     ED Results / Procedures / Treatments   Labs (all labs ordered are listed, but only abnormal results are displayed) Labs Reviewed  CBC - Abnormal; Notable for the following components:      Result Value   MCV 78.0 (*)    MCH 24.4 (*)     RDW 16.4 (*)    All other components within normal limits  COMPREHENSIVE METABOLIC PANEL - Abnormal; Notable for the following components:   Glucose, Bld 101 (*)    Albumin 3.3 (*)    AST 14 (*)    All other components within normal  limits  PROTIME-INR  APTT  DIFFERENTIAL  I-STAT CHEM 8, ED  CBG MONITORING, ED    EKG EKG Interpretation  Date/Time:  Wednesday December 14 2020 18:40:28 EST Ventricular Rate:  70 PR Interval:  118 QRS Duration: 76 QT Interval:  424 QTC Calculation: 457 R Axis:   29 Text Interpretation: Normal sinus rhythm Nonspecific ST and T wave abnormality Abnormal ECG Confirmed by Madalyn Rob [phone]) on 12/14/2020 10:23:27 PM   Radiology CT Angio Head W or Wo Contrast  Result Date: 12/14/2020 CLINICAL DATA:  Third cranial nerve palsy EXAM: CT ANGIOGRAPHY HEAD AND NECK TECHNIQUE: Multidetector CT imaging of the head and neck was performed using the standard protocol during bolus administration of intravenous contrast. Multiplanar CT image reconstructions and MIPs were obtained to evaluate the vascular anatomy. Carotid stenosis measurements (when applicable) are obtained utilizing NASCET criteria, using the distal internal carotid diameter as the denominator. CONTRAST:  17mL OMNIPAQUE IOHEXOL 350 MG/ML SOLN COMPARISON:  None. FINDINGS: CTA NECK FINDINGS SKELETON: There is no bony spinal canal stenosis. No lytic or blastic lesion. OTHER NECK: Normal pharynx, larynx and major salivary glands. No cervical lymphadenopathy. Unremarkable thyroid gland. UPPER CHEST: No pneumothorax or pleural effusion. No nodules or masses. AORTIC ARCH: There is calcific atherosclerosis of the aortic arch. There is no aneurysm, dissection or hemodynamically significant stenosis of the visualized portion of the aorta. Conventional 3 vessel aortic branching pattern. The visualized proximal subclavian arteries are widely patent. RIGHT CAROTID SYSTEM: Normal without aneurysm, dissection or  stenosis. LEFT CAROTID SYSTEM: No dissection, occlusion or aneurysm. There is mixed density atherosclerosis extending into the proximal ICA, resulting in less than 50% stenosis. VERTEBRAL ARTERIES: Left dominant configuration. Both origins are clearly patent. There is no dissection, occlusion or flow-limiting stenosis to the skull base (V1-V3 segments). CTA HEAD FINDINGS POSTERIOR CIRCULATION: --Vertebral arteries: Normal V4 segments. --Inferior cerebellar arteries: Normal. --Basilar artery: Normal. --Superior cerebellar arteries: Normal. --Posterior cerebral arteries (PCA): Normal. ANTERIOR CIRCULATION: --Intracranial internal carotid arteries: There is an aneurysm of the left P-comm origin that measures 3 x 3 mm. --Anterior cerebral arteries (ACA): Normal. Both A1 segments are present. Patent anterior communicating artery (a-comm). --Middle cerebral arteries (MCA): Normal. VENOUS SINUSES: As permitted by contrast timing, patent. ANATOMIC VARIANTS: None Review of the MIP images confirms the above findings. IMPRESSION: 1. 3 x 3 mm left P-comm origin aneurysm. 2. No intracranial arterial occlusion or high-grade stenosis. Aortic Atherosclerosis (ICD10-I70.0). Electronically Signed   By: Ulyses Jarred M.D.   On: 12/14/2020 21:04   CT HEAD WO CONTRAST  Result Date: 12/14/2020 CLINICAL DATA:  Visual disturbance. Abnormal left eye ophthalmological exam. EXAM: CT HEAD WITHOUT CONTRAST TECHNIQUE: Contiguous axial images were obtained from the base of the skull through the vertex without intravenous contrast. COMPARISON:  10/27/2012.  MRI 12/09/2009 FINDINGS: Brain: 2.6 x 3.2 cm in diameter extra-axial mass consistent with meningioma in the far right lateral posterior fossa. This indents the lateral aspect of the cerebellar hemisphere but is not associated with any brain edema or significant affect upon the fourth ventricle. There is a broad surface along the sigmoid sinus and sigmoid sinus invasion/occlusion is  possible. Cerebral hemispheres show old small vessel infarctions in both thalami and mild chronic small-vessel ischemic changes elsewhere within the hemispheric white matter. No supratentorial mass lesion. No hemorrhage, hydrocephalus or extra-axial collection. Vascular: There is atherosclerotic calcification of the major vessels at the base of the brain. Skull: No skull fracture. Sinuses/Orbits: Sinuses are clear. Old left medial orbital blowout fracture.  No acute orbital finding. Patient does appear to have some degree of divergent gaze. Other: None IMPRESSION: 1. 2.6 x 3.2 cm in diameter extra-axial mass in the far right lateral posterior fossa consistent with meningioma. This indents the lateral aspect of the cerebellar hemisphere but is not associated with any brain edema or significant affect upon the fourth ventricle. There is a broad surface along the sigmoid sinus and sigmoid sinus invasion/occlusion is possible. This was seen on the MRI from 20/11 at which time it measured 15 x 19 mm. 2. Old small vessel infarctions of both thalami and mild chronic small-vessel ischemic changes elsewhere within the hemispheric white matter. 3. Old left medial orbital blowout fracture. 4. Patient does appear to have some degree of divergent gaze. Electronically Signed   By: Nelson Chimes M.D.   On: 12/14/2020 19:24   CT Angio Neck W and/or Wo Contrast  Result Date: 12/14/2020 CLINICAL DATA:  Third cranial nerve palsy EXAM: CT ANGIOGRAPHY HEAD AND NECK TECHNIQUE: Multidetector CT imaging of the head and neck was performed using the standard protocol during bolus administration of intravenous contrast. Multiplanar CT image reconstructions and MIPs were obtained to evaluate the vascular anatomy. Carotid stenosis measurements (when applicable) are obtained utilizing NASCET criteria, using the distal internal carotid diameter as the denominator. CONTRAST:  85mL OMNIPAQUE IOHEXOL 350 MG/ML SOLN COMPARISON:  None. FINDINGS: CTA  NECK FINDINGS SKELETON: There is no bony spinal canal stenosis. No lytic or blastic lesion. OTHER NECK: Normal pharynx, larynx and major salivary glands. No cervical lymphadenopathy. Unremarkable thyroid gland. UPPER CHEST: No pneumothorax or pleural effusion. No nodules or masses. AORTIC ARCH: There is calcific atherosclerosis of the aortic arch. There is no aneurysm, dissection or hemodynamically significant stenosis of the visualized portion of the aorta. Conventional 3 vessel aortic branching pattern. The visualized proximal subclavian arteries are widely patent. RIGHT CAROTID SYSTEM: Normal without aneurysm, dissection or stenosis. LEFT CAROTID SYSTEM: No dissection, occlusion or aneurysm. There is mixed density atherosclerosis extending into the proximal ICA, resulting in less than 50% stenosis. VERTEBRAL ARTERIES: Left dominant configuration. Both origins are clearly patent. There is no dissection, occlusion or flow-limiting stenosis to the skull base (V1-V3 segments). CTA HEAD FINDINGS POSTERIOR CIRCULATION: --Vertebral arteries: Normal V4 segments. --Inferior cerebellar arteries: Normal. --Basilar artery: Normal. --Superior cerebellar arteries: Normal. --Posterior cerebral arteries (PCA): Normal. ANTERIOR CIRCULATION: --Intracranial internal carotid arteries: There is an aneurysm of the left P-comm origin that measures 3 x 3 mm. --Anterior cerebral arteries (ACA): Normal. Both A1 segments are present. Patent anterior communicating artery (a-comm). --Middle cerebral arteries (MCA): Normal. VENOUS SINUSES: As permitted by contrast timing, patent. ANATOMIC VARIANTS: None Review of the MIP images confirms the above findings. IMPRESSION: 1. 3 x 3 mm left P-comm origin aneurysm. 2. No intracranial arterial occlusion or high-grade stenosis. Aortic Atherosclerosis (ICD10-I70.0). Electronically Signed   By: Ulyses Jarred M.D.   On: 12/14/2020 21:04   MR BRAIN WO CONTRAST  Result Date: 12/14/2020 CLINICAL DATA:   Left eye changes with aneurysm EXAM: MRI HEAD WITHOUT CONTRAST TECHNIQUE: Multiplanar, multiecho pulse sequences of the brain and surrounding structures were obtained without intravenous contrast. COMPARISON:  None. FINDINGS: Brain: No acute infarct, mass effect or extra-axial collection. No acute or chronic hemorrhage. There is multifocal hyperintense T2-weighted signal within the white matter. Parenchymal volume and CSF spaces are normal. There is a right posterior fossa meningioma measuring 3.0 x 2.0 cm. The midline structures are normal. Vascular: Major flow voids are preserved. Skull and upper cervical spine:  Normal calvarium and skull base. Visualized upper cervical spine and soft tissues are normal. Sinuses/Orbits:No paranasal sinus fluid levels or advanced mucosal thickening. No mastoid or middle ear effusion. Normal orbits. IMPRESSION: 1. No acute intracranial abnormality. 2. Right posterior fossa meningioma. 3. Findings of mild chronic small vessel disease. Electronically Signed   By: Ulyses Jarred M.D.   On: 12/14/2020 22:00    Procedures .Critical Care Performed by: Lucrezia Starch, MD Authorized by: Lucrezia Starch, MD   Critical care provider statement:    Critical care time (minutes):  42   Critical care was necessary to treat or prevent imminent or life-threatening deterioration of the following conditions:  CNS failure or compromise   Critical care was time spent personally by me on the following activities:  Discussions with consultants, evaluation of patient's response to treatment, examination of patient, ordering and performing treatments and interventions, ordering and review of laboratory studies, ordering and review of radiographic studies, pulse oximetry, re-evaluation of patient's condition, obtaining history from patient or surrogate and review of old charts     Medications Ordered in ED Medications  sodium chloride flush (NS) 0.9 % injection 3 mL (3 mLs Intravenous  Given 12/14/20 2119)  iohexol (OMNIPAQUE) 350 MG/ML injection 75 mL (75 mLs Intravenous Contrast Given 12/14/20 2040)    ED Course  I have reviewed the triage vital signs and the nursing notes.  Pertinent labs & imaging results that were available during my care of the patient were reviewed by me and considered in my medical decision making (see chart for details).    MDM Rules/Calculators/A&P                          69 year old lady presenting to ER with concern for diplopia since Saturday.  Has complete 3rd nerve palsy, sent from ophthalmology office due to concern for P-comm aneurysm.  Reviewed initially with neurology, recommended CTA head and neck and MRI brain without contrast to further assess.  CTA demonstrating left P-comm origin aneurysm.  Consulted Dr. Christella Noa with neurosurgery.  He will admit patient for further management.  Final Clinical Impression(s) / ED Diagnoses Final diagnoses:  None    Rx / DC Orders ED Discharge Orders    None       Lucrezia Starch, MD 12/14/20 2320

## 2020-12-15 ENCOUNTER — Inpatient Hospital Stay (HOSPITAL_COMMUNITY): Payer: Medicare Other

## 2020-12-15 ENCOUNTER — Inpatient Hospital Stay (HOSPITAL_COMMUNITY): Payer: Medicare Other | Admitting: Anesthesiology

## 2020-12-15 ENCOUNTER — Encounter (HOSPITAL_COMMUNITY)
Admission: EM | Disposition: A | Discharge status: Home Health | Payer: Self-pay | Source: Home / Self Care | Attending: Neurosurgery

## 2020-12-15 ENCOUNTER — Encounter (HOSPITAL_COMMUNITY): Payer: Self-pay | Admitting: Neurosurgery

## 2020-12-15 DIAGNOSIS — J9600 Acute respiratory failure, unspecified whether with hypoxia or hypercapnia: Secondary | ICD-10-CM

## 2020-12-15 DIAGNOSIS — I671 Cerebral aneurysm, nonruptured: Principal | ICD-10-CM

## 2020-12-15 HISTORY — PX: CRANIOTOMY: SHX93

## 2020-12-15 HISTORY — PX: INSERTION OF SUPRAPUBIC CATHETER: SHX5870

## 2020-12-15 LAB — CBC WITH DIFFERENTIAL/PLATELET
Abs Immature Granulocytes: 0.01 10*3/uL (ref 0.00–0.07)
Basophils Absolute: 0 10*3/uL (ref 0.0–0.1)
Basophils Relative: 1 %
Eosinophils Absolute: 0.2 10*3/uL (ref 0.0–0.5)
Eosinophils Relative: 2 %
HCT: 37.9 % (ref 36.0–46.0)
Hemoglobin: 11.5 g/dL — ABNORMAL LOW (ref 12.0–15.0)
Immature Granulocytes: 0 %
Lymphocytes Relative: 27 %
Lymphs Abs: 1.8 10*3/uL (ref 0.7–4.0)
MCH: 23.9 pg — ABNORMAL LOW (ref 26.0–34.0)
MCHC: 30.3 g/dL (ref 30.0–36.0)
MCV: 78.6 fL — ABNORMAL LOW (ref 80.0–100.0)
Monocytes Absolute: 0.5 10*3/uL (ref 0.1–1.0)
Monocytes Relative: 8 %
Neutro Abs: 4.1 10*3/uL (ref 1.7–7.7)
Neutrophils Relative %: 62 %
Platelets: 233 10*3/uL (ref 150–400)
RBC: 4.82 MIL/uL (ref 3.87–5.11)
RDW: 16.1 % — ABNORMAL HIGH (ref 11.5–15.5)
WBC: 6.6 10*3/uL (ref 4.0–10.5)
nRBC: 0 % (ref 0.0–0.2)

## 2020-12-15 LAB — PREPARE RBC (CROSSMATCH)

## 2020-12-15 LAB — POCT I-STAT 7, (LYTES, BLD GAS, ICA,H+H)
Acid-Base Excess: 3 mmol/L — ABNORMAL HIGH (ref 0.0–2.0)
Acid-Base Excess: 0 mmol/L (ref 0.0–2.0)
Bicarbonate: 27.9 mmol/L (ref 20.0–28.0)
Bicarbonate: 24.4 mmol/L (ref 20.0–28.0)
Calcium, Ion: 1.23 mmol/L (ref 1.15–1.40)
Calcium, Ion: 1.25 mmol/L (ref 1.15–1.40)
HCT: 36 % (ref 36.0–46.0)
HCT: 40 % (ref 36.0–46.0)
Hemoglobin: 12.2 g/dL (ref 12.0–15.0)
Hemoglobin: 13.6 g/dL (ref 12.0–15.0)
O2 Saturation: 100 %
O2 Saturation: 98 %
Potassium: 3.7 mmol/L (ref 3.5–5.1)
Potassium: 3.6 mmol/L (ref 3.5–5.1)
Sodium: 142 mmol/L (ref 135–145)
Sodium: 142 mmol/L (ref 135–145)
TCO2: 29 mmol/L (ref 22–32)
TCO2: 25 mmol/L (ref 22–32)
pCO2 arterial: 43 mmHg (ref 32.0–48.0)
pCO2 arterial: 37 mmHg (ref 32.0–48.0)
pH, Arterial: 7.421 (ref 7.350–7.450)
pH, Arterial: 7.427 (ref 7.350–7.450)
pO2, Arterial: 280 mmHg — ABNORMAL HIGH (ref 83.0–108.0)
pO2, Arterial: 98 mmHg (ref 83.0–108.0)

## 2020-12-15 LAB — GLUCOSE, CAPILLARY
Glucose-Capillary: 105 mg/dL — ABNORMAL HIGH (ref 70–99)
Glucose-Capillary: 104 mg/dL — ABNORMAL HIGH (ref 70–99)
Glucose-Capillary: 102 mg/dL — ABNORMAL HIGH (ref 70–99)
Glucose-Capillary: 145 mg/dL — ABNORMAL HIGH (ref 70–99)
Glucose-Capillary: 152 mg/dL — ABNORMAL HIGH (ref 70–99)
Glucose-Capillary: 131 mg/dL — ABNORMAL HIGH (ref 70–99)

## 2020-12-15 LAB — ABO/RH: ABO/RH(D): A POS

## 2020-12-15 LAB — HIV ANTIBODY (ROUTINE TESTING W REFLEX): HIV Screen 4th Generation wRfx: NONREACTIVE

## 2020-12-15 LAB — RESP PANEL BY RT-PCR (FLU A&B, COVID) ARPGX2
Influenza A by PCR: NEGATIVE
Influenza B by PCR: NEGATIVE
SARS Coronavirus 2 by RT PCR: NEGATIVE

## 2020-12-15 LAB — HEMOGLOBIN A1C
Hgb A1c MFr Bld: 5.9 % — ABNORMAL HIGH (ref 4.8–5.6)
Mean Plasma Glucose: 122.63 mg/dL

## 2020-12-15 LAB — MRSA PCR SCREENING: MRSA by PCR: NEGATIVE

## 2020-12-15 SURGERY — CRANIOTOMY INTRACRANIAL ANEURYSM FOR CAROTID
Anesthesia: General | Site: Urethra | Laterality: Left

## 2020-12-15 MED ORDER — BISACODYL 5 MG PO TBEC: 5.0000 mg | DELAYED_RELEASE_TABLET | Freq: Every day | ORAL | Status: DC | PRN

## 2020-12-15 MED ORDER — CEFAZOLIN SODIUM-DEXTROSE 2-3 GM-%(50ML) IV SOLR
INTRAVENOUS | Status: DC | PRN
  Administered 2020-12-15: 2 g via INTRAVENOUS

## 2020-12-15 MED ORDER — LACTATED RINGERS IV SOLN: INTRAVENOUS | Status: DC

## 2020-12-15 MED ORDER — ACETAMINOPHEN 650 MG RE SUPP: 650.0000 mg | RECTAL | Status: DC | PRN

## 2020-12-15 MED ORDER — PERPHENAZINE 4 MG PO TABS
8.0000 mg | ORAL_TABLET | Freq: Two times a day (BID) | ORAL | Status: DC
  Administered 2020-12-15 – 2020-12-16 (×2): 8 mg
  Filled 2020-12-15 (×3): qty 2

## 2020-12-15 MED ORDER — PHENYLEPHRINE HCL-NACL 10-0.9 MG/250ML-% IV SOLN
INTRAVENOUS | Status: DC | PRN
  Administered 2020-12-15: 25 ug/min via INTRAVENOUS

## 2020-12-15 MED ORDER — PANTOPRAZOLE SODIUM 40 MG IV SOLR
40.0000 mg | Freq: Every day | INTRAVENOUS | Status: DC
  Administered 2020-12-15 – 2020-12-18 (×4): 40 mg via INTRAVENOUS
  Filled 2020-12-15 (×4): qty 40

## 2020-12-15 MED ORDER — LIDOCAINE-EPINEPHRINE 1 %-1:100000 IJ SOLN
INTRAMUSCULAR | Status: AC
  Filled 2020-12-15: qty 1

## 2020-12-15 MED ORDER — BACITRACIN ZINC 500 UNIT/GM EX OINT
TOPICAL_OINTMENT | CUTANEOUS | Status: DC | PRN
  Administered 2020-12-15: 1 via TOPICAL

## 2020-12-15 MED ORDER — LIDOCAINE-EPINEPHRINE 1 %-1:100000 IJ SOLN
INTRAMUSCULAR | Status: DC | PRN
  Administered 2020-12-15: 5 mL

## 2020-12-15 MED ORDER — SODIUM CHLORIDE 0.9 % IV SOLN: INTRAVENOUS | Status: DC | PRN

## 2020-12-15 MED ORDER — LABETALOL HCL 5 MG/ML IV SOLN: 10.0000 mg | INTRAVENOUS | Status: DC | PRN

## 2020-12-15 MED ORDER — NALOXONE HCL 0.4 MG/ML IJ SOLN: 0.0800 mg | INTRAMUSCULAR | Status: DC | PRN

## 2020-12-15 MED ORDER — ACETAMINOPHEN 325 MG PO TABS: 650.0000 mg | ORAL_TABLET | ORAL | Status: DC | PRN

## 2020-12-15 MED ORDER — CHLORHEXIDINE GLUCONATE CLOTH 2 % EX PADS
6.0000 | MEDICATED_PAD | Freq: Every day | CUTANEOUS | Status: DC
  Administered 2020-12-17 – 2020-12-28 (×12): 6 via TOPICAL

## 2020-12-15 MED ORDER — PHENYLEPHRINE 40 MCG/ML (10ML) SYRINGE FOR IV PUSH (FOR BLOOD PRESSURE SUPPORT)
PREFILLED_SYRINGE | INTRAVENOUS | Status: AC
  Filled 2020-12-15: qty 10

## 2020-12-15 MED ORDER — AMLODIPINE BESYLATE 5 MG PO TABS
5.0000 mg | ORAL_TABLET | Freq: Every day | ORAL | Status: DC
  Administered 2020-12-16 – 2020-12-17 (×2): 5 mg
  Filled 2020-12-15 (×2): qty 1

## 2020-12-15 MED ORDER — ACETAMINOPHEN 325 MG PO TABS: 325.0000 mg | ORAL_TABLET | Freq: Once | ORAL | Status: DC | PRN

## 2020-12-15 MED ORDER — MIDAZOLAM HCL 2 MG/2ML IJ SOLN
INTRAMUSCULAR | Status: AC
  Filled 2020-12-15: qty 2

## 2020-12-15 MED ORDER — LEVETIRACETAM IN NACL 500 MG/100ML IV SOLN
500.0000 mg | Freq: Two times a day (BID) | INTRAVENOUS | Status: DC
  Administered 2020-12-15 – 2020-12-20 (×11): 500 mg via INTRAVENOUS
  Filled 2020-12-15 (×11): qty 100

## 2020-12-15 MED ORDER — MANNITOL 25 % IV SOLN
INTRAVENOUS | Status: DC | PRN
  Administered 2020-12-15: 25 g via INTRAVENOUS

## 2020-12-15 MED ORDER — SENNA 8.6 MG PO TABS
1.0000 | ORAL_TABLET | Freq: Two times a day (BID) | ORAL | Status: DC
  Administered 2020-12-15 – 2020-12-18 (×5): 8.6 mg
  Filled 2020-12-15 (×5): qty 1

## 2020-12-15 MED ORDER — SIMVASTATIN 20 MG PO TABS
40.0000 mg | ORAL_TABLET | Freq: Every day | ORAL | Status: DC
  Administered 2020-12-16: 40 mg
  Filled 2020-12-15: qty 2

## 2020-12-15 MED ORDER — ROCURONIUM BROMIDE 10 MG/ML (PF) SYRINGE
PREFILLED_SYRINGE | INTRAVENOUS | Status: AC
  Filled 2020-12-15: qty 10

## 2020-12-15 MED ORDER — BACITRACIN ZINC 500 UNIT/GM EX OINT
TOPICAL_OINTMENT | CUTANEOUS | Status: AC
  Filled 2020-12-15: qty 28.35

## 2020-12-15 MED ORDER — PROPOFOL 10 MG/ML IV BOLUS
INTRAVENOUS | Status: DC | PRN
Start: 2020-12-15 — End: 2020-12-15
  Administered 2020-12-15: 50 mg via INTRAVENOUS
  Administered 2020-12-15: 150 mg via INTRAVENOUS
  Administered 2020-12-15: 20 mg via INTRAVENOUS

## 2020-12-15 MED ORDER — FLUMAZENIL 0.5 MG/5ML IV SOLN
INTRAVENOUS | Status: DC | PRN
  Administered 2020-12-15: .2 mg via INTRAVENOUS
  Administered 2020-12-15: .1 mg via INTRAVENOUS
  Administered 2020-12-15: .2 mg via INTRAVENOUS

## 2020-12-15 MED ORDER — GLYCOPYRROLATE PF 0.2 MG/ML IJ SOSY
PREFILLED_SYRINGE | INTRAMUSCULAR | Status: DC | PRN
  Administered 2020-12-15: .2 mg via INTRAVENOUS

## 2020-12-15 MED ORDER — DEXAMETHASONE SODIUM PHOSPHATE 10 MG/ML IJ SOLN
INTRAMUSCULAR | Status: AC
  Filled 2020-12-15: qty 1

## 2020-12-15 MED ORDER — AMISULPRIDE (ANTIEMETIC) 5 MG/2ML IV SOLN: 10.0000 mg | Freq: Once | INTRAVENOUS | Status: DC | PRN

## 2020-12-15 MED ORDER — POLYETHYLENE GLYCOL 3350 17 G PO PACK: 17.0000 g | PACK | Freq: Every day | ORAL | Status: DC | PRN

## 2020-12-15 MED ORDER — FENTANYL CITRATE (PF) 250 MCG/5ML IJ SOLN
INTRAMUSCULAR | Status: AC
  Filled 2020-12-15: qty 5

## 2020-12-15 MED ORDER — HYDROCODONE-ACETAMINOPHEN 5-325 MG PO TABS: 1.0000 | ORAL_TABLET | ORAL | Status: DC | PRN

## 2020-12-15 MED ORDER — LIDOCAINE 2% (20 MG/ML) 5 ML SYRINGE
INTRAMUSCULAR | Status: AC
  Filled 2020-12-15: qty 5

## 2020-12-15 MED ORDER — CLEVIDIPINE BUTYRATE 0.5 MG/ML IV EMUL
0.0000 mg/h | INTRAVENOUS | Status: AC
  Administered 2020-12-15: 2 mg/h via INTRAVENOUS
  Filled 2020-12-15: qty 50

## 2020-12-15 MED ORDER — 0.9 % SODIUM CHLORIDE (POUR BTL) OPTIME
TOPICAL | Status: DC | PRN
  Administered 2020-12-15: 3000 mL

## 2020-12-15 MED ORDER — HEMOSTATIC AGENTS (NO CHARGE) OPTIME
TOPICAL | Status: DC | PRN
  Administered 2020-12-15: 1 via TOPICAL

## 2020-12-15 MED ORDER — FENTANYL CITRATE (PF) 100 MCG/2ML IJ SOLN
25.0000 ug | INTRAMUSCULAR | Status: DC | PRN
  Administered 2020-12-16 – 2020-12-17 (×2): 25 ug via INTRAVENOUS
  Filled 2020-12-15 (×2): qty 2

## 2020-12-15 MED ORDER — BUPIVACAINE HCL (PF) 0.5 % IJ SOLN
INTRAMUSCULAR | Status: AC
  Filled 2020-12-15: qty 30

## 2020-12-15 MED ORDER — ACETAMINOPHEN 160 MG/5ML PO SOLN: 325.0000 mg | Freq: Once | ORAL | Status: DC | PRN

## 2020-12-15 MED ORDER — THROMBIN 20000 UNITS EX SOLR
CUTANEOUS | Status: AC
  Filled 2020-12-15: qty 20000

## 2020-12-15 MED ORDER — BENZTROPINE MESYLATE 1 MG PO TABS
1.0000 mg | ORAL_TABLET | Freq: Two times a day (BID) | ORAL | Status: DC
  Administered 2020-12-15 – 2020-12-16 (×2): 1 mg
  Filled 2020-12-15 (×3): qty 1

## 2020-12-15 MED ORDER — SODIUM CHLORIDE 0.9 % IV SOLN: INTRAVENOUS | Status: DC

## 2020-12-15 MED ORDER — HYDRALAZINE HCL 20 MG/ML IJ SOLN
5.0000 mg | Freq: Once | INTRAMUSCULAR | Status: AC
  Administered 2020-12-15: 5 mg via INTRAVENOUS
  Filled 2020-12-15: qty 1

## 2020-12-15 MED ORDER — FENTANYL CITRATE (PF) 100 MCG/2ML IJ SOLN: 25.0000 ug | INTRAMUSCULAR | Status: DC | PRN

## 2020-12-15 MED ORDER — THROMBIN 5000 UNITS EX SOLR
CUTANEOUS | Status: AC
  Filled 2020-12-15: qty 5000

## 2020-12-15 MED ORDER — PROPOFOL 1000 MG/100ML IV EMUL
0.0000 ug/kg/min | INTRAVENOUS | Status: DC
  Administered 2020-12-15: 5 ug/kg/min via INTRAVENOUS
  Filled 2020-12-15 (×2): qty 100

## 2020-12-15 MED ORDER — CLEVIDIPINE BUTYRATE 0.5 MG/ML IV EMUL: 0.0000 mg/h | INTRAVENOUS | Status: DC

## 2020-12-15 MED ORDER — BUPIVACAINE HCL 0.5 % IJ SOLN
INTRAMUSCULAR | Status: DC | PRN
  Administered 2020-12-15: 5 mL

## 2020-12-15 MED ORDER — HYDROMORPHONE HCL 1 MG/ML IJ SOLN: 0.5000 mg | INTRAMUSCULAR | Status: DC | PRN

## 2020-12-15 MED ORDER — FENTANYL CITRATE (PF) 100 MCG/2ML IJ SOLN
25.0000 ug | INTRAMUSCULAR | Status: DC | PRN
  Administered 2020-12-19 (×2): 100 ug via INTRAVENOUS
  Filled 2020-12-15 (×2): qty 2

## 2020-12-15 MED ORDER — DEXAMETHASONE SODIUM PHOSPHATE 10 MG/ML IJ SOLN
INTRAMUSCULAR | Status: DC | PRN
  Administered 2020-12-15: 10 mg via INTRAVENOUS

## 2020-12-15 MED ORDER — PHENYLEPHRINE 40 MCG/ML (10ML) SYRINGE FOR IV PUSH (FOR BLOOD PRESSURE SUPPORT)
PREFILLED_SYRINGE | INTRAVENOUS | Status: DC | PRN
  Administered 2020-12-15 (×2): 80 ug via INTRAVENOUS

## 2020-12-15 MED ORDER — SUGAMMADEX SODIUM 200 MG/2ML IV SOLN
INTRAVENOUS | Status: DC | PRN
  Administered 2020-12-15: 120 mg via INTRAVENOUS
  Administered 2020-12-15: 80 mg via INTRAVENOUS

## 2020-12-15 MED ORDER — PROPOFOL 10 MG/ML IV BOLUS
INTRAVENOUS | Status: AC
  Filled 2020-12-15: qty 20

## 2020-12-15 MED ORDER — PROMETHAZINE HCL 25 MG PO TABS: 12.5000 mg | ORAL_TABLET | ORAL | Status: DC | PRN

## 2020-12-15 MED ORDER — MEPERIDINE HCL 25 MG/ML IJ SOLN: 6.2500 mg | INTRAMUSCULAR | Status: DC | PRN

## 2020-12-15 MED ORDER — THROMBIN 5000 UNITS EX SOLR
OROMUCOSAL | Status: DC | PRN
  Administered 2020-12-15: 5 mL via TOPICAL

## 2020-12-15 MED ORDER — THROMBIN 20000 UNITS EX SOLR
CUTANEOUS | Status: DC | PRN
  Administered 2020-12-15: 20 mL via TOPICAL

## 2020-12-15 MED ORDER — POLYETHYLENE GLYCOL 3350 17 G PO PACK
17.0000 g | PACK | Freq: Every day | ORAL | Status: DC
  Administered 2020-12-15 – 2020-12-17 (×3): 17 g
  Filled 2020-12-15 (×3): qty 1

## 2020-12-15 MED ORDER — INDOCYANINE GREEN 25 MG IV SOLR
INTRAVENOUS | Status: DC | PRN
  Administered 2020-12-15: 12.5 mg via INTRAVENOUS

## 2020-12-15 MED ORDER — LIDOCAINE 2% (20 MG/ML) 5 ML SYRINGE
INTRAMUSCULAR | Status: DC | PRN
  Administered 2020-12-15: 60 mg via INTRAVENOUS

## 2020-12-15 MED ORDER — CEFAZOLIN SODIUM-DEXTROSE 2-4 GM/100ML-% IV SOLN
2.0000 g | Freq: Three times a day (TID) | INTRAVENOUS | Status: AC
Start: 2020-12-15 — End: 2020-12-16
  Administered 2020-12-15 – 2020-12-16 (×2): 2 g via INTRAVENOUS
  Filled 2020-12-15 (×2): qty 100

## 2020-12-15 MED ORDER — FENTANYL CITRATE (PF) 250 MCG/5ML IJ SOLN
INTRAMUSCULAR | Status: DC | PRN
  Administered 2020-12-15: 100 ug via INTRAVENOUS
  Administered 2020-12-15: 50 ug via INTRAVENOUS

## 2020-12-15 MED ORDER — ONDANSETRON HCL 4 MG PO TABS: 4.0000 mg | ORAL_TABLET | ORAL | Status: DC | PRN

## 2020-12-15 MED ORDER — CEFAZOLIN SODIUM 1 G IJ SOLR
INTRAMUSCULAR | Status: AC
  Filled 2020-12-15: qty 20

## 2020-12-15 MED ORDER — SENNOSIDES-DOCUSATE SODIUM 8.6-50 MG PO TABS: 1.0000 | ORAL_TABLET | Freq: Every evening | ORAL | Status: DC | PRN

## 2020-12-15 MED ORDER — MIDAZOLAM HCL 5 MG/5ML IJ SOLN
INTRAMUSCULAR | Status: DC | PRN
  Administered 2020-12-15: 2 mg via INTRAVENOUS

## 2020-12-15 MED ORDER — IOHEXOL 350 MG/ML SOLN
75.0000 mL | Freq: Once | INTRAVENOUS | Status: AC | PRN
  Administered 2020-12-15: 75 mL via INTRAVENOUS

## 2020-12-15 MED ORDER — METOPROLOL TARTRATE 25 MG PO TABS
12.5000 mg | ORAL_TABLET | Freq: Two times a day (BID) | ORAL | Status: DC
  Filled 2020-12-15: qty 1

## 2020-12-15 MED ORDER — ROCURONIUM BROMIDE 10 MG/ML (PF) SYRINGE
PREFILLED_SYRINGE | INTRAVENOUS | Status: DC | PRN
  Administered 2020-12-15: 100 mg via INTRAVENOUS

## 2020-12-15 MED ORDER — ONDANSETRON HCL 4 MG/2ML IJ SOLN: 4.0000 mg | INTRAMUSCULAR | Status: DC | PRN

## 2020-12-15 MED ORDER — ONDANSETRON HCL 4 MG/2ML IJ SOLN
INTRAMUSCULAR | Status: AC
  Filled 2020-12-15: qty 2

## 2020-12-15 MED ORDER — LEVETIRACETAM IN NACL 1000 MG/100ML IV SOLN
1000.0000 mg | INTRAVENOUS | Status: AC
  Administered 2020-12-15: 1000 mg via INTRAVENOUS
  Filled 2020-12-15: qty 100

## 2020-12-15 MED ORDER — ONDANSETRON HCL 4 MG/2ML IJ SOLN
INTRAMUSCULAR | Status: DC | PRN
  Administered 2020-12-15: 4 mg via INTRAVENOUS

## 2020-12-15 MED ORDER — FLUMAZENIL 0.5 MG/5ML IV SOLN
INTRAVENOUS | Status: AC
  Filled 2020-12-15: qty 5

## 2020-12-15 MED ORDER — ACETAMINOPHEN 10 MG/ML IV SOLN: 1000.0000 mg | Freq: Once | INTRAVENOUS | Status: DC | PRN

## 2020-12-15 MED ORDER — FLEET ENEMA 7-19 GM/118ML RE ENEM: 1.0000 | ENEMA | Freq: Once | RECTAL | Status: DC | PRN

## 2020-12-15 MED ORDER — DOCUSATE SODIUM 50 MG/5ML PO LIQD
100.0000 mg | Freq: Two times a day (BID) | ORAL | Status: DC
  Administered 2020-12-15 – 2020-12-18 (×5): 100 mg
  Filled 2020-12-15 (×5): qty 10

## 2020-12-15 MED ORDER — NICARDIPINE HCL IN NACL 20-0.86 MG/200ML-% IV SOLN
3.0000 mg/h | INTRAVENOUS | Status: DC
  Administered 2020-12-15: 5 mg/h via INTRAVENOUS
  Filled 2020-12-15 (×2): qty 200

## 2020-12-15 MED ORDER — CLEVIDIPINE BUTYRATE 0.5 MG/ML IV EMUL
0.0000 mg/h | INTRAVENOUS | Status: AC
  Administered 2020-12-15: 14 mg/h via INTRAVENOUS
  Filled 2020-12-15: qty 50

## 2020-12-15 MED ORDER — SENNA 8.6 MG PO TABS: 1.0000 | ORAL_TABLET | Freq: Two times a day (BID) | ORAL | Status: DC

## 2020-12-15 MED ORDER — CLEVIDIPINE BUTYRATE 0.5 MG/ML IV EMUL
0.0000 mg/h | INTRAVENOUS | Status: DC
  Administered 2020-12-15 – 2020-12-16 (×2): 16 mg/h via INTRAVENOUS
  Administered 2020-12-16: 2 mg/h via INTRAVENOUS
  Administered 2020-12-18: 8 mg/h via INTRAVENOUS
  Administered 2020-12-18: 13 mg/h via INTRAVENOUS
  Administered 2020-12-19: 21 mg/h via INTRAVENOUS
  Administered 2020-12-19: 14 mg/h via INTRAVENOUS
  Administered 2020-12-19: 1 mg/h via INTRAVENOUS
  Administered 2020-12-19: 10 mg/h via INTRAVENOUS
  Administered 2020-12-20: 18 mg/h via INTRAVENOUS
  Administered 2020-12-20 (×3): 20 mg/h via INTRAVENOUS
  Administered 2020-12-20: 6 mg/h via INTRAVENOUS
  Administered 2020-12-20: 18 mg/h via INTRAVENOUS
  Filled 2020-12-15 (×5): qty 50
  Filled 2020-12-15: qty 100
  Filled 2020-12-15 (×5): qty 50
  Filled 2020-12-15: qty 100
  Filled 2020-12-15 (×3): qty 50

## 2020-12-15 MED ORDER — TRAZODONE HCL 50 MG PO TABS
150.0000 mg | ORAL_TABLET | Freq: Every day | ORAL | Status: DC
  Administered 2020-12-15: 150 mg
  Filled 2020-12-15: qty 3

## 2020-12-15 SURGICAL SUPPLY — 106 items
APL SKNCLS STERI-STRIP NONHPOA (GAUZE/BANDAGES/DRESSINGS)
BAND INSRT 18 STRL LF DISP RB (MISCELLANEOUS) ×4
BAND RUBBER #18 3X1/16 STRL (MISCELLANEOUS) ×6 IMPLANT
BENZOIN TINCTURE PRP APPL 2/3 (GAUZE/BANDAGES/DRESSINGS) IMPLANT
BIT DRILL WIRE PASS 1.3MM (BIT) IMPLANT
BLADE SAW GIGLI 16 STRL (MISCELLANEOUS) IMPLANT
BLADE SURG 15 STRL LF DISP TIS (BLADE) ×2 IMPLANT
BLADE SURG 15 STRL SS (BLADE) ×3
BNDG CMPR 75X41 PLY ABS (GAUZE/BANDAGES/DRESSINGS) ×2
BNDG CMPR 75X41 PLY HI ABS (GAUZE/BANDAGES/DRESSINGS)
BNDG GAUZE ELAST 4 BULKY (GAUZE/BANDAGES/DRESSINGS) ×2 IMPLANT
BNDG STRETCH 4X75 NS LF (GAUZE/BANDAGES/DRESSINGS) ×1 IMPLANT
BNDG STRETCH 4X75 STRL LF (GAUZE/BANDAGES/DRESSINGS) IMPLANT
BUR ACORN 6.0 PRECISION (BURR) IMPLANT
BUR MATCHSTICK NEURO 3.0 LAGG (BURR) IMPLANT
BUR ROUND FLUTED 4 SOFT TCH (BURR) ×1 IMPLANT
BUR ROUND FLUTED 5 RND (BURR) IMPLANT
BUR SPIRAL ROUTER 2.3 (BUR) IMPLANT
CANISTER SUCT 3000ML PPV (MISCELLANEOUS) ×3 IMPLANT
CARTRIDGE OIL MAESTRO DRILL (MISCELLANEOUS) ×2 IMPLANT
CATH COUDE FOLEY 5CC 14FR (CATHETERS) IMPLANT
CATH FOLEY 2WAY  3CC 10FR (CATHETERS)
CATH FOLEY 2WAY 3CC 10FR (CATHETERS) IMPLANT
CATH FOLEY 2WAY SLVR  5CC 12FR (CATHETERS) ×3
CATH FOLEY 2WAY SLVR 5CC 12FR (CATHETERS) IMPLANT
CATH FOLEY LATEX FREE 12 FR (CATHETERS)
CATH FOLEY LF 12 FR (CATHETERS) IMPLANT
CATH INTERMIT  6FR 70CM (CATHETERS) ×1 IMPLANT
CLIP ANEURY TI PERM MINI 6.6M (Clip) IMPLANT
CLIP ANEURY TI PERM STD CVD 8M (Clip) ×1 IMPLANT
CLIP VESOCCLUDE MED 6/CT (CLIP) IMPLANT
COVER WAND RF STERILE (DRAPES) ×3 IMPLANT
DECANTER SPIKE VIAL GLASS SM (MISCELLANEOUS) ×3 IMPLANT
DIFFUSER DRILL AIR PNEUMATIC (MISCELLANEOUS) ×3 IMPLANT
DRAPE MICROSCOPE LEICA (MISCELLANEOUS) ×3 IMPLANT
DRAPE NEUROLOGICAL W/INCISE (DRAPES) ×3 IMPLANT
DRAPE WARM FLUID 44X44 (DRAPES) ×3 IMPLANT
DRILL WIRE PASS 1.3MM (BIT)
DRSG ADAPTIC 3X8 NADH LF (GAUZE/BANDAGES/DRESSINGS) ×4 IMPLANT
DRSG TELFA 3X8 NADH (GAUZE/BANDAGES/DRESSINGS) ×3 IMPLANT
DURAPREP 6ML APPLICATOR 50/CS (WOUND CARE) ×3 IMPLANT
ELECT REM PT RETURN 9FT ADLT (ELECTROSURGICAL) ×3
ELECTRODE REM PT RTRN 9FT ADLT (ELECTROSURGICAL) ×2 IMPLANT
EVACUATOR SILICONE 100CC (DRAIN) IMPLANT
FORCEPS BIPOLAR SPETZLER 8 1.0 (NEUROSURGERY SUPPLIES) IMPLANT
GAUZE 4X4 16PLY RFD (DISPOSABLE) IMPLANT
GAUZE SPONGE 4X4 12PLY STRL (GAUZE/BANDAGES/DRESSINGS) IMPLANT
GAUZE SPONGE 4X4 12PLY STRL LF (GAUZE/BANDAGES/DRESSINGS) ×1 IMPLANT
GLOVE BIO SURGEON STRL SZ7.5 (GLOVE) ×2 IMPLANT
GLOVE BIOGEL PI IND STRL 7.5 (GLOVE) ×4 IMPLANT
GLOVE BIOGEL PI INDICATOR 7.5 (GLOVE) ×2
GLOVE ECLIPSE 7.0 STRL STRAW (GLOVE) ×6 IMPLANT
GOWN STRL REUS W/ TWL LRG LVL3 (GOWN DISPOSABLE) ×4 IMPLANT
GOWN STRL REUS W/ TWL XL LVL3 (GOWN DISPOSABLE) IMPLANT
GOWN STRL REUS W/TWL 2XL LVL3 (GOWN DISPOSABLE) IMPLANT
GOWN STRL REUS W/TWL LRG LVL3 (GOWN DISPOSABLE) ×6
GOWN STRL REUS W/TWL XL LVL3 (GOWN DISPOSABLE)
GUIDEWIRE STR DUAL SENSOR (WIRE) ×1 IMPLANT
HEMOSTAT POWDER KIT SURGIFOAM (HEMOSTASIS) IMPLANT
HEMOSTAT SURGICEL 2X14 (HEMOSTASIS) ×3 IMPLANT
HOOK DURA (MISCELLANEOUS) ×3 IMPLANT
HOOK DURA 1/2IN (MISCELLANEOUS) ×3 IMPLANT
KIT BASIN OR (CUSTOM PROCEDURE TRAY) ×3 IMPLANT
KIT DRAIN CSF ACCUDRAIN (MISCELLANEOUS) IMPLANT
KIT TURNOVER KIT B (KITS) ×3 IMPLANT
KNIFE ARACHNOID DISP AM-24-S (MISCELLANEOUS) ×1 IMPLANT
NDL HYPO 25X1 1.5 SAFETY (NEEDLE) ×2 IMPLANT
NDL SPNL 25GX3.5 QUINCKE BL (NEEDLE) IMPLANT
NEEDLE HYPO 22GX1.5 SAFETY (NEEDLE) IMPLANT
NEEDLE HYPO 25X1 1.5 SAFETY (NEEDLE) ×3 IMPLANT
NEEDLE SPNL 25GX3.5 QUINCKE BL (NEEDLE) IMPLANT
NS IRRIG 1000ML POUR BTL (IV SOLUTION) ×3 IMPLANT
OIL CARTRIDGE MAESTRO DRILL (MISCELLANEOUS) ×3
PACK BATTERY CMF DISP FOR DVR (ORTHOPEDIC DISPOSABLE SUPPLIES) ×1 IMPLANT
PACK CRANIOTOMY CUSTOM (CUSTOM PROCEDURE TRAY) ×3 IMPLANT
PAD DRESSING TELFA 3X8 NADH (GAUZE/BANDAGES/DRESSINGS) ×2 IMPLANT
PATTIES SURGICAL .25X.25 (GAUZE/BANDAGES/DRESSINGS) IMPLANT
PATTIES SURGICAL .5 X.5 (GAUZE/BANDAGES/DRESSINGS) IMPLANT
PATTIES SURGICAL .5 X3 (DISPOSABLE) IMPLANT
PATTIES SURGICAL 1/4 X 3 (GAUZE/BANDAGES/DRESSINGS) IMPLANT
PATTIES SURGICAL 1X1 (DISPOSABLE) IMPLANT
PERFORATOR LRG  14-11MM (BIT)
PERFORATOR LRG 14-11MM (BIT) IMPLANT
PIN MAYFIELD SKULL DISP (PIN) IMPLANT
PLATE CRANIAL UNIV 8H (Plate) ×1 IMPLANT
PLATE UNIV CMF 16 2H (Plate) ×2 IMPLANT
SCREW UNIII AXS SD 1.5X4 (Screw) ×6 IMPLANT
SPONGE NEURO XRAY DETECT 1X3 (DISPOSABLE) IMPLANT
SPONGE SURGIFOAM ABS GEL 100 (HEMOSTASIS) ×3 IMPLANT
STAPLER VISISTAT 35W (STAPLE) ×3 IMPLANT
STOCKINETTE 6  STRL (DRAPES) ×3
STOCKINETTE 6 STRL (DRAPES) ×2 IMPLANT
SUT ETHILON 3 0 FSL (SUTURE) IMPLANT
SUT NURALON 4 0 TR CR/8 (SUTURE) ×6 IMPLANT
SUT VIC AB 0 CT1 18XCR BRD8 (SUTURE) ×4 IMPLANT
SUT VIC AB 0 CT1 8-18 (SUTURE) ×6
SUT VIC AB 3-0 SH 8-18 (SUTURE) ×3 IMPLANT
TIP NONSTICK .5MMX23CM (INSTRUMENTS) ×3
TIP NONSTICK .5X23 (INSTRUMENTS) IMPLANT
TOWEL GREEN STERILE (TOWEL DISPOSABLE) ×3 IMPLANT
TOWEL GREEN STERILE FF (TOWEL DISPOSABLE) ×3 IMPLANT
TRAY FOL W/BAG SLVR 16FR STRL (SET/KITS/TRAYS/PACK) IMPLANT
TRAY FOLEY MTR SLVR 16FR STAT (SET/KITS/TRAYS/PACK) IMPLANT
TRAY FOLEY W/BAG SLVR 16FR LF (SET/KITS/TRAYS/PACK)
TUBE CONNECTING 20X1/4 (TUBING) ×1 IMPLANT
WATER STERILE IRR 1000ML POUR (IV SOLUTION) ×3 IMPLANT

## 2020-12-15 NOTE — Anesthesia Procedure Notes (Signed)

## 2020-12-15 NOTE — Anesthesia Preprocedure Evaluation (Addendum)
Anesthesia Evaluation  Patient identified by MRN, date of birth, ID band  Reviewed: Allergy & Precautions, NPO status , Patient's Chart, lab work & pertinent test results  Airway Mallampati: I  TM Distance: >3 FB Neck ROM: Full    Dental  (+) Edentulous Upper, Edentulous Lower   Pulmonary Current Smoker,     + decreased breath sounds      Cardiovascular hypertension, Pt. on medications  Rhythm:Regular Rate:Normal     Neuro/Psych PSYCHIATRIC DISORDERS Anxiety Depression Schizophrenia    GI/Hepatic negative GI ROS, Neg liver ROS,   Endo/Other  diabetes  Renal/GU negative Renal ROS     Musculoskeletal negative musculoskeletal ROS (+)   Abdominal Normal abdominal exam  (+)   Peds  Hematology negative hematology ROS (+)   Anesthesia Other Findings   Reproductive/Obstetrics                            Anesthesia Physical Anesthesia Plan  ASA: III  Anesthesia Plan: General   Post-op Pain Management:    Induction: Intravenous  PONV Risk Score and Plan: 3 and Ondansetron, Dexamethasone, Treatment may vary due to age or medical condition and Amisulpride  Airway Management Planned: Oral ETT  Additional Equipment: Arterial line  Intra-op Plan:   Post-operative Plan: Possible Post-op intubation/ventilation  Informed Consent: I have reviewed the patients History and Physical, chart, labs and discussed the procedure including the risks, benefits and alternatives for the proposed anesthesia with the patient or authorized representative who has indicated his/her understanding and acceptance.       Plan Discussed with: CRNA  Anesthesia Plan Comments: (Lab Results      Component                Value               Date                      WBC                      6.6                 12/14/2020                HGB                      11.5 (L)            12/14/2020                HCT                       37.9                12/14/2020                MCV                      78.6 (L)            12/14/2020                PLT                      233                 12/14/2020            )  Anesthesia Quick Evaluation  

## 2020-12-15 NOTE — Procedures (Addendum)
Foley Catheter Placement Note  Indications: 70 y.o. female undergoing craniotomy for whom urology was consulted for intraoperative foley placement.   Pre-operative Diagnosis: Urethral meatal stenosis  Post-operative Diagnosis: Same  Surgeon: Carmie Kanner, MD  Assistants: None  Procedure Details  Patient was placed in the supine lithotomy position, prepped with Betadine and draped in the usual sterile fashion. Very narrow urethral meatus was noted. Attempted placement of 14Fr coude catheter was unsuccessful. A sensor wire was then passed into the bladder and placement confirmed with a 5Fr open ended catheter. A 12 Fr catheter was then placed over the wire which passed easily into the bladder. There was return of clear yellow urine and then proceeded to insert 10 mL of sterile water into the Foley balloon.  The catheter was attached to a drainage bag and secured with a StatLock.                 Complications: None; patient tolerated the procedure well.  Plan:   1.  Continue Foley catheter on discharge, will schedule patient to follow up in urology clinic for TOV and further evaluation of meatal stenosis       Attending Attestation: Dr. Milford Cage was available.  .  I reviewed the resident's note and agree with the documented findings and plan of care.

## 2020-12-15 NOTE — Op Note (Signed)
NEUROSURGERY OPERATIVE NOTE   PREOP DIAGNOSIS:  1. Left posterior communicating artery aneurysm 2. Left occulomotor nerve palsy   POSTOP DIAGNOSIS: Same  PROCEDURE: 1. Left pterional craniotomy for clipping of posterior communicating artery aneurysm, simple 2. Use of intraoperative microscope  SURGEON: Dr. [name], MD  ASSISTANT: Ferne Reus, PA-C  ANESTHESIA: General Endotracheal  EBL: 75cc  SPECIMENS: None  DRAINS: None  COMPLICATIONS: None immediate  CONDITION: Hemodynamically stable to PACU  HISTORY: Shannon [name] is a 70 y.o. female sent to the emergency department after she had sudden onset of headache and blurry vision.  She saw her ophthalmologist who diagnosed her with a left oculomotor nerve palsy and referred her to the emergency department where CT angiogram revealed the presence of a left posterior communicating artery aneurysm.  With the acute nature of the 3rd nerve palsy, urgent treatment of her aneurysm was indicated.  After discussion regarding the indications for treatment, risks, benefits, and alternatives to surgery, patient provided consent to proceed.  All her questions were answered.  PROCEDURE IN DETAIL: The patient was brought to the operating room. After induction of general anesthesia, attempts were made to place a Foley catheter by the circulating nurse, my PA, and myself including trial of 14, 16 French Foley catheters as well as 14 and 16 [place] coud catheters.  These were all unsuccessful likely due to the presence of a significant urethral stricture.  Bladder scan was obtained indicating approximately 126 cc of urine.  We therefore elected to proceed without the presence of the Foley catheter.  Urology service was called who saw the patient at the end of the case for placement of a Foley.  The patient was positioned on the operative table in the [place] head holder in the supine position. All pressure points were meticulously  padded. Skin incision was then marked out and prepped and draped in the usual sterile fashion.  After timeout was conducted, standard curvilinear left-sided incision was infiltrated with local anesthetic without epinephrine.  Incision was then made sharply and carried down through the galea.  Raney clips were applied.  The temporalis muscle and fascia were then incised and a single piece myocutaneous flap was elevated and reflected anteriorly.  Bur hole over the pterion, superior temporal line, and just above the root of the zygoma were made.  These were connected with the craniotome.  The lesser wing of the sphenoid was drilled down and the craniotomy flap was elevated.  Hemostasis on the epidural plane was secured with bipolar electrocautery.  The remainder of the lesser wing of the sphenoid was then drilled down in order to obtain unrestricted visualization of the supraclinoid carotid artery.  At this point the dura was opened in curvilinear fashion and tacked up anteriorly.  The microscope was then draped sterilely and brought into the field and the remainder of the case was done under the microscope using microdissection technique.  Initially, a subfrontal approach was employed to identify the optic nerve.  Arachnoid overlying the optic nerve was incised and dissection was carried slightly medially and more laterally to identify the internal carotid artery at its exit from the dural ring.  Dissection was further carried out along the carotid artery taking care to limit any temporal traction.  The remainder of the supraclinoid internal carotid artery was exposed to the level of the ICA bifurcation.  The anterior choroidal artery was identified.  The oculomotor nerve was identified with what appeared to be aneurysm dome displacing the nerve superiorly  and laterally.  Dissection was then carried out on the undersurface of the internal carotid artery at the level of the aneurysm neck both proximally and  distally in order to create space for clip blades.  A curved 7 mm mini titanium Aesculap clip was selected and placed across the neck of the aneurysm.  At this point, further dissection was carried out and the clip did appear to completely cover the aneurysm neck.  There did not appear to be any perforators caught within the clip blades.  A 12.5 mg dose of indocyanine green was then administered by the anesthesia service.  Fluorescence angiography was then recorded on the microscope and reviewed indicating patency of the internal carotid artery, anterior choroidal artery, and no visible filling of the aneurysm dome.  At this point the wound was irrigated with normal saline irrigation.  No active bleeding was identified.  The dura was then reapproximated with interrupted 4-0 Nurolon stitches.  The dura was covered with a Gelfoam onlay graft.  The bone was then replaced and plated with standard titanium plates and screws.  The temporalis muscle was reapproximated with interrupted 0 Vicryl stitches and the galea was reapproximated with interrupted 3-0 Vicryl.  Skin was closed with staples.  Bacitracin ointment and sterile dressing was applied.  Patient was then removed from the Mayfield head holder and a head wrap was applied.  At the end of the case all sponge, needle, instrument, and cottonoid counts were correct.  Urology service then saw the patient for placement of a Foley catheter, details dictated separately.   [name], MD [facility]

## 2020-12-15 NOTE — Progress Notes (Signed)
Pt seen in PACU immediately postop. Pt has been off inhalation anesthetic gas for approx. One hour, remains somnolent. She breathes spontaneously over vent, coughs, with reactive right pupil. Left pupil remains non-reactive (preop CN III palsy) but has no motor responses to pain.  I have personally reviewed the CT/CTA. This demonstrates a significant amount of bifrontal pneumocephaly but no significant mass effect on the brain nor is there effacement of the frontal horns. There is no HCP, no postoperative hematoma. CTA demonstrates widely patent bilateral ICA, ACA, MCA and Basilar artery/PCAs. Aneurysm clip is in place with occlusion of the previously seen left Pcom aneurysm.   Etiology of her very slow emergence from anesthesia remains unclear however I do not see any evidence of vascular occlusion /stroke nor is there a surgically reversible cause such as hematoma or hydrocephalus. It is possible with the pneumocephalus and her psychiatric history that here wakeup may be significantly prolonged. Will therefore plan on monitoring overnight and cont to monitor her neurologic condition. If there is no change, will likely obtain EEG in am.   [name], MD [facility]

## 2020-12-15 NOTE — Progress Notes (Signed)
NEUROSURGERY PROGRESS NOTE   Pt seen and examined. No issues overnight. History reviewed in EMR and with patient. Briefly, The patient was in her normal state of health until approximately five days ago when she noted sudden onset of blurry vision.  She does note decreased acuity but does not see double with either eye closed.  At the same time she noted onset of headache including left-sided retro-orbital headache.  She does not complain of any associated numbness, tingling, or weakness of the extremities.  She is able to ambulate normally.  No changes in bladder function.  Of note, the patient has a history of hypertension although it appears to be poorly controlled.  She does also have a medical history significant for diabetes.  She denies any cardiac history or history of stroke.  She denies use of any anti-platelet or anticoagulation medications.  She is a smoker, with approximately 25 pack year history smoking half pack a day since age 17. No known family history of intracranial aneurysms or hemorrhage.  EXAM: Temp:  [98 F (36.7 C)-98.3 F (36.8 C)] 98.2 F (36.8 C) (02/24 0800) Pulse Rate:  [44-81] 74 (02/24 0800) Resp:  [12-22] 20 (02/24 0800) BP: (137-185)/(43-107) 185/61 (02/24 0800) SpO2:  [90 %-100 %] 95 % (02/24 0800) Weight:  [57.9 kg] 57.9 kg (02/24 0500) Intake/Output      02/23 0701 02/24 0700 02/24 0701 02/25 0700   I.V. (mL/kg) 220.3 (3.8) 154.9 (2.7)   Total Intake(mL/kg) 220.3 (3.8) 154.9 (2.7)   Urine (mL/kg/hr) 300    Total Output 300    Net -79.7 +154.9         Awake, alert, oriented Speech fluent, appropriate Left ptosis, lateral/sup gaze palsy Remainder of CN normal 5/5 BUE/BLE  LABS: Lab Results  Component Value Date   CREATININE 0.90 12/14/2020   BUN 12 12/14/2020   NA 138 12/14/2020   K 3.8 12/14/2020   CL 98 12/14/2020   CO2 27 12/14/2020   Lab Results  Component Value Date   WBC 6.6 12/14/2020   HGB 11.5 (L) 12/14/2020   HCT 37.9  12/14/2020   MCV 78.6 (L) 12/14/2020   PLT 233 12/14/2020    IMAGING:   CT angiogram of head and neck was personally reviewed.  This demonstrates a posterior laterally projecting aneurysm of the supraclinoid left internal carotid artery.  This measures approximately 6 mm in length and appears to have a relatively narrow neck.  In addition, there is significant calcific atherosclerotic disease involving the cervical and intracranial portions of the internal carotid artery, including significant calcified plaque at the left cervical internal carotid artery origin.  Stenosis in this region measures approximately 50%.  IMPRESSION: 70 y.o. female   Presenting with acute onset of headache and left third nerve palsy in the setting of an unruptured left posterior communicating artery aneurysm.  Patient does have significant vascular risk factors.  I therefore do think the treatment of this aneurysm in an urgent fashion is indicated in order to prevent subarachnoid hemorrhage.  While coiling is certainly an option, with the size of this aneurysm as well as the presence of significant calcified atherosclerotic disease especially in the cervical carotid and the ability to decompress the third nerve surgically, I do think that clipping is preferable of this case. The quality of the CT angiogram is good and I do not think that there is a significant amount of further information to be gleaned from formal catheter angiogram.  PLAN: -   We  will plan on proceeding with left-sided craniotomy for clipping of posterior communicating artery aneurysm    I did review the situation above with the patient.  I reviewed the reason for her blurry vision and the need for urgent treatment in order to prevent subarachnoid hemorrhage.  We did discuss the options for treatment including my recommendation for surgical clipping.  We discussed the details of the surgery as well as the expected postoperative course and recovery.  We did  discuss specific risks of the surgical clipping including the risk of stroke possibly leading to weakness, paralysis, coma, death, risk of bleeding including possibly life-threatening bleeding, seizure, infection, and hydrocephalus.  Patient appeared to understand our discussion.  All her questions today were answered.  She provided verbal consent to proceed.   [name], MD [facility]

## 2020-12-15 NOTE — Transfer of Care (Signed)
Immediate Anesthesia Transfer of Care Note  Patient: Shannon [name]  Procedure(s) Performed: LEFT CRANIOTOMY FOR INTRACRANIAL ANEURYSM (Left ) INSERTION OF FOLEY CATHETER (Urethra)  Patient Location: PACU  Anesthesia Type:General  Level of Consciousness: Patient remains intubated per anesthesia plan  Airway & Oxygen Therapy: Patient placed on Ventilator (see vital sign flow sheet for setting)  Post-op Assessment: Report given to RN  Post vital signs: Reviewed and unstable  Last Vitals:  Vitals Value Taken Time  BP    Temp    Pulse    Resp    SpO2      Last Pain:  Vitals:   12/15/20 1200  TempSrc: Oral  PainSc: 0-No pain         Complications:  Encounter Complications  Complication Outcome Phase Comment  Unable to extubate  Intraprocedure patient spontaneously breathing with support, not following commands.

## 2020-12-15 NOTE — ED Notes (Signed)
Dr Christella Noa made aware of pt blood pressure and low HR. Received verbal order for hydralazine

## 2020-12-15 NOTE — Consult Note (Signed)
NAME:  Shannon [name], MRN:  [id number], DOB:  08/29/1951, LOS: 1 ADMISSION DATE:  12/14/2020, CONSULTATION DATE: 2/24 REFERRING MD: Dr. Christella Noa, CHIEF COMPLAINT: Postop respiratory failure  Brief History:  70 year old female who presented with double vision found to have a left P-comm aneurysm and a right posterior fossa meningioma.  She underwent left pterional craniotomy for clipping of posterior communicating artery aneurysm on 2/24.  History of Present Illness:  Patient is encephalopathic and/or intubated. Therefore history has been obtained from chart review.  70 year old female with past medical history as below, which is significant for diabetes, hyperlipidemia, hypertension, and schizophrenia.  She initially presented to her ophthalmology office on 2/23 with complaints of double vision.  The ophthalmologist identified a left 3rd cranial nerve palsy and directed the patient immediately to the emergency department.  In the emergency department she complained of double vision and headache behind the left eye.  She CT angiogram was done and identified a left P-comm aneurysm and a right posterior fossa meningioma.  She was admitted by the neurosurgery team and on 2/24 she was taken to the operating room for left pterional craniotomy and clipping of the posterior communicating artery aneurysm.  Postoperatively she was very slow to arouse post anesthesia.  For this reason she was left on the ventilator and transferred to neurosurgical ICU where PCCM was consulted.  Past Medical History:   has a past medical history of Anxiety, Depression, Diabetes mellitus, Hyperlipidemia, Hypertension, and Schizophrenia ([place]).   Significant Hospital Events:  2/23 presented for double vision and headache, found to have P-comm aneurysm 2/24 2 OR for clipping  Consults:  Neurosurgery Urology  Procedures:  2/24 ET tube  Significant Diagnostic Tests:  CT head 2/23> 2.6 x 3.2 cm extra-axial mass in the far  right lateral posterior fossa consistent with meningioma.  Old small vessel infarctions of both thalami. CT angio head, neck 2/23> 3 x 3 left P-comm origin aneurysm.  No intracranial arterial occlusion or high-grade stenosis. MRI brain 2/23> no acute intracranial abnormality, right posterior fossa meningioma. CT angiogram head 2/24 > status post left pterional craniotomy for clipping of left posterior communicating artery aneurysm.  No evidence of continued aneurysm opacification.  Micro Data:    Antimicrobials:     Interim History / Subjective:    Objective   Blood pressure (!) 143/56, pulse 72, temperature (!) 97.5 F (36.4 C), resp. rate 19, weight 57.9 kg, SpO2 100 %.    Vent Mode: PRVC FiO2 (%):  [50 %] 50 % Set Rate:  [18 bmp] 18 bmp Vt Set:  [450 mL] 450 mL PEEP:  [5 cmH20] 5 cmH20   Intake/Output Summary (Last 24 hours) at 12/15/2020 2032 Last data filed at 12/15/2020 1850 Gross per 24 hour  Intake 2093.39 ml  Output 975 ml  Net 1118.39 ml   Filed Weights   12/15/20 0500  Weight: 57.9 kg    Examination: General: middle aged female HENT: Surgical bandage in place. L pupil sluggish.  Lungs: Clear, unlabored Cardiovascular: RRR, no MRG Abdomen: Soft, non-tender, non-distended Extremities: No acute deformity Neuro: Sedated GU: Foley  Resolved Hospital Problem list     Assessment & Plan:   Left posterior communicating artery aneurysm: s/p clip 2/24 - Management per neurosurgery   Acute hypoxemic respiratory failure - Full vent support - CXR - ABG - Propofol infusion for RASS goal -1 to -2 - Plans for SAT/SBT in AM  DM - CBG monitoring and SSI  Difficult Foley placement: Foley catheter  was placed by urology in the [place]. - Urology recommends continuing Foley catheter through discharge to be further evaluated at the urology clinic as an outpatient.  HTN: - Cleviprex infusion for SBP goal < 160  - Continue home amlodipine, metoprolol - Hold home HCTZ,  lisinopril pending post contrast labs.   Schizophrenia - Holding home lithium, zyprexa until taking PO    Best practice (evaluated daily)  Diet: NPO Pain/Anxiety/Delirium protocol (if indicated): Prop/PRN fent VAP protocol (if indicated): Per protocol DVT prophylaxis: SCDs GI prophylaxis: PPI Glucose control: SSI Mobility: BR Disposition:ICU  Goals of Care:  Last date of multidisciplinary goals of care discussion: Family and staff present:  Summary of discussion:  Follow up goals of care discussion due: 3/3 Code Status: FULL  Labs   CBC: Recent Labs  Lab 12/14/20 1835 12/14/20 1902 12/14/20 2308 12/15/20 1522  WBC 8.2  --  6.6  --   NEUTROABS 4.9  --  4.1  --   HGB 12.4 14.3 11.5* 12.2  HCT 39.7 42.0 37.9 36.0  MCV 78.0*  --  78.6*  --   PLT 278  --  233  --     Basic Metabolic Panel: Recent Labs  Lab 12/14/20 1835 12/14/20 1902 12/15/20 1522  NA 135 138 142  K 3.9 3.8 3.7  CL 101 98  --   CO2 27  --   --   GLUCOSE 101* 96  --   BUN 10 12  --   CREATININE 0.90 0.90  --   CALCIUM 9.2  --   --    GFR: CrCl cannot be calculated (Unknown ideal weight.). Recent Labs  Lab 12/14/20 1835 12/14/20 2308  WBC 8.2 6.6    Liver Function Tests: Recent Labs  Lab 12/14/20 1835  AST 14*  ALT 10  ALKPHOS 92  BILITOT 0.5  PROT 6.8  ALBUMIN 3.3*   No results for input(s): LIPASE, AMYLASE in the last 168 hours. No results for input(s): AMMONIA in the last 168 hours.  ABG    Component Value Date/Time   PHART 7.421 12/15/2020 1522   PCO2ART 43.0 12/15/2020 1522   PO2ART 280 (H) 12/15/2020 1522   HCO3 27.9 12/15/2020 1522   TCO2 29 12/15/2020 1522   O2SAT 100.0 12/15/2020 1522     Coagulation Profile: Recent Labs  Lab 12/14/20 1835  INR 1.0    Cardiac Enzymes: No results for input(s): CKTOTAL, CKMB, CKMBINDEX, TROPONINI in the last 168 hours.  HbA1C: Hgb A1c MFr Bld  Date/Time Value Ref Range Status  12/14/2020 11:08 PM 5.9 (H) 4.8 - 5.6 %  Final    Comment:    (NOTE) Pre diabetes:          5.7%-6.4%  Diabetes:              >6.4%  Glycemic control for   <7.0% adults with diabetes   10/30/2012 06:10 AM 6.1 (H) <5.7 % Final    Comment:    (NOTE)                                                                       According to the ADA Clinical Practice Recommendations for 2011, when HbA1c is used as a screening test:  >=  6.5%   Diagnostic of Diabetes Mellitus           (if abnormal result is confirmed) 5.7-6.4%   Increased risk of developing Diabetes Mellitus References:Diagnosis and Classification of Diabetes Mellitus,Diabetes XFGH,8299,37(JIRCV 1):S62-S69 and Standards of Medical Care in         Diabetes - 2011,Diabetes Care,2011,34 (Suppl 1):S11-S61.    CBG: Recent Labs  Lab 12/15/20 0327 12/15/20 0751 12/15/20 1127 12/15/20 1846 12/15/20 2021  GLUCAP 105* 104* 102* 145* 152*    Review of Systems:   Patient is encephalopathic and/or intubated. Therefore history has been obtained from chart review.    Past Medical History:  She,  has a past medical history of Anxiety, Depression, Diabetes mellitus, Hyperlipidemia, Hypertension, and Schizophrenia ([place]).   Surgical History:   Past Surgical History:  Procedure Laterality Date  . BREAST SURGERY    . EXPLORATORY LAPAROTOMY       Social History:   reports that she has been smoking cigarettes. She has a 40.00 pack-year smoking history. She has never used smokeless tobacco. She reports current alcohol use of about 1.0 standard drink of alcohol per week. She reports that she does not use drugs.   Family History:  Her family history includes Kidney disease in her mother.   Allergies No Known Allergies   Home Medications  Prior to Admission medications   Medication Sig Start Date End Date Taking? Authorizing Provider  acetaminophen (TYLENOL) 500 MG tablet Take 1,000 mg by mouth every 6 (six) hours as needed for mild pain.   Yes [provider]   albuterol (VENTOLIN HFA) 108 (90 Base) MCG/ACT inhaler Inhale 2 puffs into the lungs every 6 (six) hours as needed for shortness of breath or wheezing. 06/21/20  Yes [provider]  amLODipine (NORVASC) 5 MG tablet Take 1 tablet (5 mg total) by mouth daily. For hypertension. Patient taking differently: Take 5 mg by mouth daily. For hypertension. Had not filled recently until 12-13-20 11/04/12  Yes Mashburn, Marlane Hatcher, PA-C  lithium carbonate (LITHOBID) 300 MG CR tablet Take 300 mg by mouth 2 (two) times daily. 10/11/20  Yes [provider]  OLANZapine (ZYPREXA) 20 MG tablet Take 20 mg by mouth at bedtime. 10/11/20  Yes [provider]     Critical care time: 38 mins     Georgann Housekeeper, AGACNP-BC [facility]  See Amion for personal pager PCCM on call pager [phone] until 7pm. Please call Elink 7p-7a. [phone]  12/15/2020 8:58 PM

## 2020-12-15 NOTE — Progress Notes (Signed)
RT called to PACU, bay 7, applied vent to 7.5 ETT/23 cm, placed commercial tube holder, secure, VT 450, RR 18, 50%, 5 peep.

## 2020-12-15 NOTE — Anesthesia Procedure Notes (Signed)
Arterial Line Insertion Start/End2/24/2022 1:35 PM, 12/15/2020 1:39 PM Performed by: CRNA  Patient location: Pre-op. Preanesthetic checklist: patient identified, IV checked, site marked, risks and benefits discussed, surgical consent, monitors and equipment checked, pre-op evaluation, timeout performed and anesthesia consent Lidocaine 1% used for infiltration Right, radial was placed Catheter size: 20 G Hand hygiene performed , maximum sterile barriers used  and Seldinger technique used Allen's test indicative of satisfactory collateral circulation Attempts: 1 Procedure performed without using ultrasound guided technique. Following insertion, dressing applied and Biopatch. Post procedure assessment: normal  Patient tolerated the procedure well with no immediate complications.

## 2020-12-15 NOTE — Progress Notes (Signed)
RT NOTE:  Pt transported from PACU to CT to 4N20 without event.

## 2020-12-16 ENCOUNTER — Encounter (HOSPITAL_COMMUNITY): Payer: Self-pay | Admitting: Neurosurgery

## 2020-12-16 DIAGNOSIS — E1159 Type 2 diabetes mellitus with other circulatory complications: Secondary | ICD-10-CM

## 2020-12-16 DIAGNOSIS — J9601 Acute respiratory failure with hypoxia: Secondary | ICD-10-CM

## 2020-12-16 DIAGNOSIS — I671 Cerebral aneurysm, nonruptured: Secondary | ICD-10-CM | POA: Diagnosis not present

## 2020-12-16 DIAGNOSIS — I152 Hypertension secondary to endocrine disorders: Secondary | ICD-10-CM

## 2020-12-16 LAB — BASIC METABOLIC PANEL
Anion gap: 9 (ref 5–15)
BUN: 11 mg/dL (ref 8–23)
CO2: 21 mmol/L — ABNORMAL LOW (ref 22–32)
Calcium: 8.3 mg/dL — ABNORMAL LOW (ref 8.9–10.3)
Chloride: 110 mmol/L (ref 98–111)
Creatinine, Ser: 0.93 mg/dL (ref 0.44–1.00)
GFR, Estimated: >60 mL/min (ref >60)
Glucose, Bld: 103 mg/dL — ABNORMAL HIGH (ref 70–99)
Potassium: 3.2 mmol/L — ABNORMAL LOW (ref 3.5–5.1)
Sodium: 140 mmol/L (ref 135–145)

## 2020-12-16 LAB — POCT I-STAT 7, (LYTES, BLD GAS, ICA,H+H)
Acid-base deficit: 1 mmol/L (ref 0.0–2.0)
Bicarbonate: 25.5 mmol/L (ref 20.0–28.0)
Calcium, Ion: 1.29 mmol/L (ref 1.15–1.40)
HCT: 41 % (ref 36.0–46.0)
Hemoglobin: 13.9 g/dL (ref 12.0–15.0)
O2 Saturation: 94 %
Potassium: 4 mmol/L (ref 3.5–5.1)
Sodium: 141 mmol/L (ref 135–145)
TCO2: 27 mmol/L (ref 22–32)
pCO2 arterial: 49.3 mmHg — ABNORMAL HIGH (ref 32.0–48.0)
pH, Arterial: 7.322 — ABNORMAL LOW (ref 7.350–7.450)
pO2, Arterial: 78 mmHg — ABNORMAL LOW (ref 83.0–108.0)

## 2020-12-16 LAB — GLUCOSE, CAPILLARY
Glucose-Capillary: 109 mg/dL — ABNORMAL HIGH (ref 70–99)
Glucose-Capillary: 130 mg/dL — ABNORMAL HIGH (ref 70–99)
Glucose-Capillary: 101 mg/dL — ABNORMAL HIGH (ref 70–99)
Glucose-Capillary: 102 mg/dL — ABNORMAL HIGH (ref 70–99)
Glucose-Capillary: 110 mg/dL — ABNORMAL HIGH (ref 70–99)
Glucose-Capillary: 123 mg/dL — ABNORMAL HIGH (ref 70–99)

## 2020-12-16 LAB — CBC
HCT: 33.4 % — ABNORMAL LOW (ref 36.0–46.0)
Hemoglobin: 10.8 g/dL — ABNORMAL LOW (ref 12.0–15.0)
MCH: 24.6 pg — ABNORMAL LOW (ref 26.0–34.0)
MCHC: 32.3 g/dL (ref 30.0–36.0)
MCV: 76.1 fL — ABNORMAL LOW (ref 80.0–100.0)
Platelets: 235 10*3/uL (ref 150–400)
RBC: 4.39 MIL/uL (ref 3.87–5.11)
RDW: 16 % — ABNORMAL HIGH (ref 11.5–15.5)
WBC: 15.3 10*3/uL — ABNORMAL HIGH (ref 4.0–10.5)
nRBC: 0 % (ref 0.0–0.2)

## 2020-12-16 LAB — PHOSPHORUS: Phosphorus: 4.3 mg/dL (ref 2.5–4.6)

## 2020-12-16 LAB — MAGNESIUM: Magnesium: 1.8 mg/dL (ref 1.7–2.4)

## 2020-12-16 LAB — TRIGLYCERIDES: Triglycerides: 141 mg/dL (ref <150)

## 2020-12-16 MED ORDER — ATORVASTATIN CALCIUM 10 MG PO TABS
20.0000 mg | ORAL_TABLET | Freq: Every day | ORAL | Status: DC
  Administered 2020-12-17 – 2020-12-18 (×2): 20 mg
  Filled 2020-12-16 (×2): qty 2

## 2020-12-16 MED ORDER — OLANZAPINE 10 MG PO TABS
20.0000 mg | ORAL_TABLET | Freq: Every day | ORAL | Status: DC
  Administered 2020-12-16 – 2020-12-18 (×3): 20 mg
  Filled 2020-12-16 (×5): qty 2

## 2020-12-16 MED ORDER — POTASSIUM CHLORIDE 20 MEQ PO PACK
20.0000 meq | PACK | ORAL | Status: AC
  Administered 2020-12-16 (×2): 20 meq
  Filled 2020-12-16 (×2): qty 1

## 2020-12-16 MED ORDER — ACETAMINOPHEN 325 MG PO TABS
650.0000 mg | ORAL_TABLET | ORAL | Status: DC | PRN
  Administered 2020-12-16 – 2020-12-18 (×4): 650 mg
  Filled 2020-12-16 (×4): qty 2

## 2020-12-16 MED ORDER — OSMOLITE 1.2 CAL PO LIQD
1000.0000 mL | ORAL | Status: DC
  Administered 2020-12-16 – 2020-12-18 (×2): 1000 mL
  Filled 2020-12-16 (×2): qty 1000

## 2020-12-16 MED ORDER — LITHIUM CITRATE 300 MG/5 ML PO SYRP
300.0000 mg | Freq: Two times a day (BID) | ORAL | Status: DC
  Filled 2020-12-16 (×2): qty 5

## 2020-12-16 MED ORDER — METOPROLOL TARTRATE 25 MG PO TABS
12.5000 mg | ORAL_TABLET | Freq: Two times a day (BID) | ORAL | Status: DC
  Administered 2020-12-17 – 2020-12-18 (×3): 12.5 mg
  Filled 2020-12-16 (×4): qty 1

## 2020-12-16 MED ORDER — CHLORHEXIDINE GLUCONATE 0.12% ORAL RINSE (MEDLINE KIT)
15.0000 mL | Freq: Two times a day (BID) | OROMUCOSAL | Status: DC
  Administered 2020-12-16 – 2020-12-20 (×10): 15 mL via OROMUCOSAL

## 2020-12-16 MED ORDER — PROSOURCE TF PO LIQD
45.0000 mL | Freq: Every day | ORAL | Status: DC
  Administered 2020-12-16 – 2020-12-18 (×3): 45 mL
  Filled 2020-12-16 (×3): qty 45

## 2020-12-16 MED ORDER — ACETAMINOPHEN 650 MG RE SUPP: 650.0000 mg | RECTAL | Status: DC | PRN

## 2020-12-16 MED ORDER — PIOGLITAZONE HCL 15 MG PO TABS
15.0000 mg | ORAL_TABLET | Freq: Every day | ORAL | Status: DC
Start: 2020-12-17 — End: 2020-12-16

## 2020-12-16 MED ORDER — ORAL CARE MOUTH RINSE
15.0000 mL | OROMUCOSAL | Status: DC
  Administered 2020-12-16 – 2020-12-20 (×43): 15 mL via OROMUCOSAL

## 2020-12-16 MED ORDER — POTASSIUM CHLORIDE 10 MEQ/100ML IV SOLN
10.0000 meq | INTRAVENOUS | Status: AC
  Administered 2020-12-16 (×4): 10 meq via INTRAVENOUS
  Filled 2020-12-16 (×4): qty 100

## 2020-12-16 NOTE — Progress Notes (Signed)
PT Cancellation Note  Patient Details Name: Shannon [name] MRN: [id number] DOB: [birth year]   Cancelled Treatment:    Reason Eval/Treat Not Completed: Active bedrest order. PT remains intubated and has an active bedrest order present in chart. PT will follow up once pt is off bedrest and more appropriate to participate in skilled PT intervention and evaluation.   Zenaida Niece 12/16/2020, 1:42 PM

## 2020-12-16 NOTE — Anesthesia Postprocedure Evaluation (Signed)
Anesthesia Post Note  Patient: Shannon [name]  Procedure(s) Performed: LEFT CRANIOTOMY FOR INTRACRANIAL ANEURYSM (Left ) INSERTION OF FOLEY CATHETER (Urethra)     Patient location during evaluation: SICU Anesthesia Type: General Level of consciousness: sedated Pain management: pain level controlled Vital Signs Assessment: post-procedure vital signs reviewed and stable Respiratory status: patient remains intubated per anesthesia plan Cardiovascular status: stable Postop Assessment: no apparent nausea or vomiting Anesthetic complications: yes   Encounter Complications  Complication Outcome Phase Comment  Unable to extubate  Intraprocedure patient spontaneously breathing with support, not following commands.    Last Vitals:  Vitals:   12/16/20 0320 12/16/20 0400  BP:  (!) 99/50  Pulse: (!) 55 (!) 58  Resp: 19 20  Temp:  37.6 C  SpO2: 97% 98%    Last Pain:  Vitals:   12/16/20 0400  TempSrc: Oral  PainSc:                  [name]

## 2020-12-16 NOTE — Progress Notes (Signed)
  NEUROSURGERY PROGRESS NOTE   Pt seen and examined. No issues overnight.   EXAM: Temp:  [97.1 F (36.2 C)-101.9 F (38.8 C)] 99.7 F (37.6 C) (02/25 0400) Pulse Rate:  [48-93] 53 (02/25 0800) Resp:  [14-21] 20 (02/25 0800) BP: (99-148)/(50-68) 124/58 (02/25 0800) SpO2:  [95 %-100 %] 100 % (02/25 0800) Arterial Line BP: (79-177)/(42-92) 79/54 (02/25 0800) FiO2 (%):  [40 %-50 %] 40 % (02/25 0746) Weight:  [66.1 kg] 66.1 kg (02/25 0411) Intake/Output      02/24 0701 02/25 0700 02/25 0701 02/26 0700   I.V. (mL/kg) 2537.5 (38.4) 80.3 (1.2)   NG/GT 60    IV Piggyback 450 265.1   Total Intake(mL/kg) 3047.5 (46.1) 345.5 (5.2)   Urine (mL/kg/hr) 1510 (1)    Blood 75    Total Output 1585    Net +1462.5 +345.5         Easily arouses to voice Right pupil briskly reactive, left pupil sluggish but reactive Breathing spontaneously over vent Moves all extremities spontaneously with good strength, localizes BUE Not following commands Headwrap in place, c/d/i   LABS: Lab Results  Component Value Date   CREATININE 0.93 12/16/2020   BUN 11 12/16/2020   NA 140 12/16/2020   K 3.2 (L) 12/16/2020   CL 110 12/16/2020   CO2 21 (L) 12/16/2020   Lab Results  Component Value Date   WBC 15.3 (H) 12/16/2020   HGB 10.8 (L) 12/16/2020   HCT 33.4 (L) 12/16/2020   MCV 76.1 (L) 12/16/2020   PLT 235 12/16/2020    IMPRESSION: - 70 y.o. female POD#1 s/p left craniotomy for clipping of Pcom aneurysm. Very slow to emerge from anesthesia yesterday however level of consciousness greatly improved this am.  PLAN: - Wean to extubate per PCCM - Cont SBP goal < 144mmHg - Cont Keppra   [name], MD [facility]

## 2020-12-16 NOTE — Progress Notes (Signed)
OT Cancellation Note  Patient Details Name: ROCKELLE HEUERMAN MRN: [id number] DOB: 12/11/1950   Cancelled Treatment:    Reason Eval/Treat Not Completed: Patient not medically ready;Active bedrest order (sedated on vent) OT order received and appreciated however this conflicts with current bedrest order set. Please increase activity tolerance as appropriate and remove bedrest from orders. . Please contact OT at [phone] if bed rest order is discontinued. OT will hold evaluation at this time and will check back as time allows pending increased activity orders.  Billey Chang, OTR/L  Acute Rehabilitation Services Pager: [phone] Office: [phone] .  12/16/2020, 8:43 AM

## 2020-12-16 NOTE — Progress Notes (Signed)
NAME:  NYARA CAPELL, MRN:  [id number], DOB:  11-07-1950, LOS: 2 ADMISSION DATE:  12/14/2020, CONSULTATION DATE: 2/24 REFERRING MD: Dr. Christella Noa, CHIEF COMPLAINT: Postop respiratory failure  Brief History:  70 year old female who presented with double vision found to have a left P-comm aneurysm and a right posterior fossa meningioma.  She underwent left pterional craniotomy for clipping of posterior communicating artery aneurysm on 2/24.  History of Present Illness:  Patient is encephalopathic and/or intubated. Therefore history has been obtained from chart review.  70 year old female with past medical history as below, which is significant for diabetes, hyperlipidemia, hypertension, and schizophrenia.  She initially presented to her ophthalmology office on 2/23 with complaints of double vision.  The ophthalmologist identified a left 3rd cranial nerve palsy and directed the patient immediately to the emergency department.  In the emergency department she complained of double vision and headache behind the left eye.  She CT angiogram was done and identified a left P-comm aneurysm and a right posterior fossa meningioma.  She was admitted by the neurosurgery team and on 2/24 she was taken to the operating room for left pterional craniotomy and clipping of the posterior communicating artery aneurysm.  Postoperatively she was very slow to arouse post anesthesia.  For this reason she was left on the ventilator and transferred to neurosurgical ICU where PCCM was consulted.  Past Medical History:   has a past medical history of Anxiety, Depression, Diabetes mellitus, Hyperlipidemia, Hypertension, and Schizophrenia ([place]).   Significant Hospital Events:  2/23 presented for double vision and headache, found to have P-comm aneurysm 2/24 2 OR for clipping  Consults:  Neurosurgery Urology  Procedures:  2/24 ET tube  Significant Diagnostic Tests:  CT head 2/23> 2.6 x 3.2 cm extra-axial mass in the far  right lateral posterior fossa consistent with meningioma.  Old small vessel infarctions of both thalami. CT angio head, neck 2/23> 3 x 3 left P-comm origin aneurysm.  No intracranial arterial occlusion or high-grade stenosis. MRI brain 2/23> no acute intracranial abnormality, right posterior fossa meningioma. CT angiogram head 2/24 > status post left pterional craniotomy for clipping of left posterior communicating artery aneurysm.  No evidence of continued aneurysm opacification.  Micro Data:    Antimicrobials:     Interim History / Subjective:  Overnight no acute events.  Morning she arouses to voice, keeps her eyes closed.  Does not follow commands.   Objective   Blood pressure (!) 148/53, pulse 61, temperature 98.6 F (37 C), temperature source Axillary, resp. rate 18, weight 66.1 kg, SpO2 100 %.    Vent Mode: PRVC FiO2 (%):  [40 %-50 %] 40 % Set Rate:  [18 bmp-20 bmp] 20 bmp Vt Set:  [450 mL] 450 mL PEEP:  [5 cmH20] 5 cmH20 Plateau Pressure:  [13 cmH20-16 cmH20] 16 cmH20   Intake/Output Summary (Last 24 hours) at 12/16/2020 1511 Last data filed at 12/16/2020 1400 Gross per 24 hour  Intake 3377.84 ml  Output 1660 ml  Net 1717.84 ml   Filed Weights   12/15/20 0500 12/16/20 0411  Weight: 57.9 kg 66.1 kg    Examination: General: middle aged female, eyes Shut, intubated Lungs: Clear, unlabored Cardiovascular: RRR, no MRG Abdomen: Soft, non-tender, non-distended Extremities: No acute deformity Neuro: Off sedation, moves all extremities, does not follow commands GU: Foley  Resolved Hospital Problem list     Assessment & Plan:   Left posterior communicating artery aneurysm: s/p clip 2/24 - Management per neurosurgery   Acute hypoxemic respiratory  failure - Full vent support.  Attempted SBT today but she failed due to low respiratory rate twice -Hold sedation, if she needs additional continue sedation recommend Precedex - Plans for SAT/SBT in AM  DM - CBG  monitoring and SSI  Difficult Foley placement: Foley catheter was placed by urology in the OR. - Urology recommends continuing Foley catheter through discharge to be further evaluated at the urology clinic as an outpatient.  HTN: - Cleviprex infusion for SBP goal < 160  - Continue home amlodipine, metoprolol - Hold home HCTZ, lisinopril for now, add back as needed  Schizophrenia -We will add back lithium and Zyprexa through tube   Best practice (evaluated daily)  Diet: NPO Pain/Anxiety/Delirium protocol (if indicated): Prop/PRN fent VAP protocol (if indicated): Per protocol DVT prophylaxis: SCDs GI prophylaxis: PPI Glucose control: SSI Mobility: BR Disposition:ICU  Goals of Care:  Last date of multidisciplinary goals of care discussion: Family and staff present:  Summary of discussion:  Follow up goals of care discussion due: 3/3 Code Status: FULL  Labs   CBC: Recent Labs  Lab 12/14/20 1835 12/14/20 1902 12/14/20 2308 12/15/20 1522 12/15/20 1901 12/15/20 2105 12/16/20 0414  WBC 8.2  --  6.6  --   --   --  15.3*  NEUTROABS 4.9  --  4.1  --   --   --   --   HGB 12.4   < > 11.5* 12.2 13.9 13.6 10.8*  HCT 39.7   < > 37.9 36.0 41.0 40.0 33.4*  MCV 78.0*  --  78.6*  --   --   --  76.1*  PLT 278  --  233  --   --   --  235   < > = values in this interval not displayed.    Basic Metabolic Panel: Recent Labs  Lab 12/14/20 1835 12/14/20 1902 12/15/20 1522 12/15/20 1901 12/15/20 2105 12/16/20 0414  NA 135 138 142 141 142 140  K 3.9 3.8 3.7 4.0 3.6 3.2*  CL 101 98  --   --   --  110  CO2 27  --   --   --   --  21*  GLUCOSE 101* 96  --   --   --  103*  BUN 10 12  --   --   --  11  CREATININE 0.90 0.90  --   --   --  0.93  CALCIUM 9.2  --   --   --   --  8.3*   GFR: CrCl cannot be calculated (Unknown ideal weight.). Recent Labs  Lab 12/14/20 1835 12/14/20 2308 12/16/20 0414  WBC 8.2 6.6 15.3*    Liver Function Tests: Recent Labs  Lab 12/14/20 1835   AST 14*  ALT 10  ALKPHOS 92  BILITOT 0.5  PROT 6.8  ALBUMIN 3.3*   No results for input(s): LIPASE, AMYLASE in the last 168 hours. No results for input(s): AMMONIA in the last 168 hours.  ABG    Component Value Date/Time   PHART 7.427 12/15/2020 2105   PCO2ART 37.0 12/15/2020 2105   PO2ART 98 12/15/2020 2105   HCO3 24.4 12/15/2020 2105   TCO2 25 12/15/2020 2105   ACIDBASEDEF 1.0 12/15/2020 1901   O2SAT 98.0 12/15/2020 2105     Coagulation Profile: Recent Labs  Lab 12/14/20 1835  INR 1.0    Cardiac Enzymes: No results for input(s): CKTOTAL, CKMB, CKMBINDEX, TROPONINI in the last 168 hours.  HbA1C: Hgb A1c MFr Bld  Date/Time Value Ref Range Status  12/14/2020 11:08 PM 5.9 (H) 4.8 - 5.6 % Final    Comment:    (NOTE) Pre diabetes:          5.7%-6.4%  Diabetes:              >6.4%  Glycemic control for   <7.0% adults with diabetes   10/30/2012 06:10 AM 6.1 (H) <5.7 % Final    Comment:    (NOTE)                                                                       According to the ADA Clinical Practice Recommendations for 2011, when HbA1c is used as a screening test:  >=6.5%   Diagnostic of Diabetes Mellitus           (if abnormal result is confirmed) 5.7-6.4%   Increased risk of developing Diabetes Mellitus References:Diagnosis and Classification of Diabetes Mellitus,Diabetes PNTI,1443,15(QMGQQ 1):S62-S69 and Standards of Medical Care in         Diabetes - 2011,Diabetes PYPP,5093,26 (Suppl 1):S11-S61.    CBG: Recent Labs  Lab 12/15/20 2021 12/15/20 2319 12/16/20 0338 12/16/20 0739 12/16/20 1146  GLUCAP 152* 131* 109* 130* 101*     Critical care time    The patient is critically ill due to respiratory failure.  Critical care was necessary to treat or prevent imminent or life-threatening deterioration.  Critical care was time spent personally by me on the following activities: development of treatment plan with patient and/or surrogate as well as  nursing, discussions with consultants, evaluation of patient's response to treatment, examination of patient, obtaining history from patient or surrogate, ordering and performing treatments and interventions, ordering and review of laboratory studies, ordering and review of radiographic studies, pulse oximetry, re-evaluation of patient's condition and participation in multidisciplinary rounds.   Critical Care Time devoted to patient care services described in this note is 35 minutes. This time reflects time of care of this [place] . This critical care time does not reflect separately billable procedures or procedure time, teaching time or supervisory time of PA/NP/Med student/Med Resident etc but could involve care discussion time.       [facility] 12/16/2020 3:11 PM  Pager: see AMION After hours pager: [phone]  If no response to pager , please call [phone] until 7pm After 7:00 pm call Elink  [phone]

## 2020-12-16 NOTE — Progress Notes (Signed)
eLink Physician-Brief Progress Note Patient Name: Shannon [name] DOB: November 14, 1950 MRN: [id number]   Date of Service  12/16/2020  HPI/Events of Note  Agitation - Request to renew restraint orders.   eICU Interventions  Will renew bilateral soft wrist restraints X 12 hours.      Intervention Category Major Interventions: Other:  Lysle Dingwall 12/16/2020, 7:58 PM

## 2020-12-16 NOTE — Progress Notes (Signed)
Initial Nutrition Assessment  DOCUMENTATION CODES:   Not applicable  INTERVENTION:   Initiate tube feeding via OG tube: Osmolite 1.2 at 60 ml/h (1440 ml per day) Prosource TF 45 ml daily  Provides 1768 kcal, 99 gm protein, 1167 ml free water daily    NUTRITION DIAGNOSIS:   Inadequate oral intake related to inability to eat as evidenced by NPO status.  GOAL:   Patient will meet greater than or equal to 90% of their needs  MONITOR:   TF tolerance,Vent status  REASON FOR ASSESSMENT:   Consult,Ventilator Enteral/tube feeding initiation and management  ASSESSMENT:   Pt with PMH of anxiety, depression, DM, HLD, HTN, and schizophrenia admitted with aneurysm and R posterior fossa meningioma s/p L crani for clipping.  2/24 crani for clipping  Pt discussed during ICU rounds and with RN.  Failed SBT this am.   Patient is currently intubated on ventilator support MV: 8.8 L/min Temp (24hrs), Avg:98.9 F (37.2 C), Min:97.1 F (36.2 C), Max:101.9 F (38.8 C)   Medications reviewed and include: colace, SSI, miralax, senna Labs reviewed: K+ 3.2 16 F OG tube    Diet Order:   Diet Order            Diet NPO time specified  Diet effective now                 EDUCATION NEEDS:   No education needs have been identified at this time  Skin:  Skin Assessment: Reviewed RN Assessment  Last BM:  unknown  Height:   Ht Readings from Last 1 Encounters:  12/16/20 5' 2.99" (1.6 m)    Weight:   Wt Readings from Last 1 Encounters:  12/16/20 66.1 kg    Ideal Body Weight:     BMI:  Body mass index is 25.82 kg/m.  Estimated Nutritional Needs:   Kcal:  1700-2000  Protein:  85-100 grams  Fluid:  > 1.7 L/day  Lockie Pares., RD, LDN, CNSC See AMiON for contact information

## 2020-12-16 NOTE — Progress Notes (Signed)
K+ 3.2 Replaced per protocol  

## 2020-12-17 DIAGNOSIS — G9341 Metabolic encephalopathy: Secondary | ICD-10-CM | POA: Diagnosis not present

## 2020-12-17 DIAGNOSIS — J9601 Acute respiratory failure with hypoxia: Secondary | ICD-10-CM | POA: Diagnosis not present

## 2020-12-17 DIAGNOSIS — I671 Cerebral aneurysm, nonruptured: Secondary | ICD-10-CM | POA: Diagnosis not present

## 2020-12-17 LAB — BASIC METABOLIC PANEL
Anion gap: 7 (ref 5–15)
BUN: 19 mg/dL (ref 8–23)
CO2: 21 mmol/L — ABNORMAL LOW (ref 22–32)
Calcium: 8.4 mg/dL — ABNORMAL LOW (ref 8.9–10.3)
Chloride: 111 mmol/L (ref 98–111)
Creatinine, Ser: 0.81 mg/dL (ref 0.44–1.00)
GFR, Estimated: >60 mL/min (ref >60)
Glucose, Bld: 113 mg/dL — ABNORMAL HIGH (ref 70–99)
Potassium: 4.1 mmol/L (ref 3.5–5.1)
Sodium: 139 mmol/L (ref 135–145)

## 2020-12-17 LAB — GLUCOSE, CAPILLARY
Glucose-Capillary: 105 mg/dL — ABNORMAL HIGH (ref 70–99)
Glucose-Capillary: 148 mg/dL — ABNORMAL HIGH (ref 70–99)
Glucose-Capillary: 164 mg/dL — ABNORMAL HIGH (ref 70–99)
Glucose-Capillary: 200 mg/dL — ABNORMAL HIGH (ref 70–99)
Glucose-Capillary: 88 mg/dL (ref 70–99)
Glucose-Capillary: 95 mg/dL (ref 70–99)

## 2020-12-17 LAB — MAGNESIUM
Magnesium: 2 mg/dL (ref 1.7–2.4)
Magnesium: 2 mg/dL (ref 1.7–2.4)

## 2020-12-17 LAB — PHOSPHORUS
Phosphorus: 3.8 mg/dL (ref 2.5–4.6)
Phosphorus: 3 mg/dL (ref 2.5–4.6)

## 2020-12-17 MED ORDER — AMLODIPINE BESYLATE 10 MG PO TABS
10.0000 mg | ORAL_TABLET | Freq: Every day | ORAL | Status: DC
  Administered 2020-12-18: 10 mg
  Filled 2020-12-17: qty 1

## 2020-12-17 MED ORDER — AMLODIPINE BESYLATE 5 MG PO TABS
5.0000 mg | ORAL_TABLET | Freq: Once | ORAL | Status: AC
  Administered 2020-12-17: 5 mg
  Filled 2020-12-17: qty 1

## 2020-12-17 NOTE — Progress Notes (Signed)
Patient ID: Shannon [name], female   DOB: 11/30/1950, 70 y.o.   MRN: [id number] Vital signs are stable Patient remains on ventilator and responds to deep central pain with grimace Not following any commands No progress to weaning vent Yesterday CT reviewed and it looks good save for subdural air At this point we will continue to observe Consider repeat CT in a.m. if no significant changes arise

## 2020-12-17 NOTE — Progress Notes (Signed)
NAME:  Shannon [name], MRN:  [id number], DOB:  10-01-1951, LOS: 3 ADMISSION DATE:  12/14/2020, CONSULTATION DATE: 2/24 REFERRING MD: Dr. Christella Noa, CHIEF COMPLAINT: Postop respiratory failure  Brief History:  70 year old female who presented with double vision found to have a left P-comm aneurysm and a right posterior fossa meningioma.  She underwent left pterional craniotomy for clipping of posterior communicating artery aneurysm on 2/24.  History of Present Illness:  Patient is encephalopathic and/or intubated. Therefore history has been obtained from chart review.  70 year old female with past medical history as below, which is significant for diabetes, hyperlipidemia, hypertension, and schizophrenia.  She initially presented to her ophthalmology office on 2/23 with complaints of double vision.  The ophthalmologist identified a left 3rd cranial nerve palsy and directed the patient immediately to the emergency department.  In the emergency department she complained of double vision and headache behind the left eye.  She CT angiogram was done and identified a left P-comm aneurysm and a right posterior fossa meningioma.  She was admitted by the neurosurgery team and on 2/24 she was taken to the operating room for left pterional craniotomy and clipping of the posterior communicating artery aneurysm.  Postoperatively she was very slow to arouse post anesthesia.  For this reason she was left on the ventilator and transferred to neurosurgical ICU where PCCM was consulted.  Past Medical History:   has a past medical history of Anxiety, Depression, Diabetes mellitus, Hyperlipidemia, Hypertension, and Schizophrenia ([place]).   Significant Hospital Events:  2/23 presented for double vision and headache, found to have P-comm aneurysm 2/24 2 OR for clipping  Consults:  Neurosurgery Urology  Procedures:  2/24 ET tube  Significant Diagnostic Tests:  CT head 2/23> 2.6 x 3.2 cm extra-axial mass in the far  right lateral posterior fossa consistent with meningioma.  Old small vessel infarctions of both thalami. CT angio head, neck 2/23> 3 x 3 left P-comm origin aneurysm.  No intracranial arterial occlusion or high-grade stenosis. MRI brain 2/23> no acute intracranial abnormality, right posterior fossa meningioma. CT angiogram head 2/24 > status post left pterional craniotomy for clipping of left posterior communicating artery aneurysm.  No evidence of continued aneurysm opacification.  Micro Data:    Antimicrobials:     Interim History / Subjective:  No overnight events. Off sedation since Friday morning. Not opening eyes, but does grimace to sternal rub.    Objective   Blood pressure (!) 118/47, pulse 60, temperature (!) 100.6 F (38.1 C), temperature source Axillary, resp. rate (!) 23, height 5' 2.99" (1.6 m), weight 69.3 kg, SpO2 97 %.    Vent Mode: PSV;CPAP FiO2 (%):  [30 %-40 %] 30 % Set Rate:  [20 bmp] 20 bmp Vt Set:  [420 mL] 420 mL PEEP:  [5 cmH20] 5 cmH20 Pressure Support:  [8 cmH20] 8 cmH20 Plateau Pressure:  [14 cmH20-15 cmH20] 14 cmH20   Intake/Output Summary (Last 24 hours) at 12/17/2020 1131 Last data filed at 12/17/2020 1100 Gross per 24 hour  Intake 3128.41 ml  Output 935 ml  Net 2193.41 ml   Filed Weights   12/16/20 0411 12/17/20 0225 12/17/20 0400  Weight: 66.1 kg 67.1 kg 69.3 kg    Examination: General: middle aged female, eyes Shut, intubated Lungs: Clear, unlabored Cardiovascular: RRR, no MRG Abdomen: Soft, non-tender, non-distended Extremities: No acute deformity Neuro: off sedation, grimaces to pain, does not follow commands.  GU: Foley  Resolved Hospital Problem list     Assessment & Plan:  Left posterior communicating artery aneurysm: s/p clip 2/24 - Management per neurosurgery   Acute hypoxemic respiratory failure - maintain vent support for LTVV. Continue to monitor off sedation - plan for PS trial today as tolerated.  - if she needs  additional continue sedation recommend Precedex  DM - CBG monitoring and SSI  Difficult Foley placement: Foley catheter was placed by urology in the [facility]. - Urology recommends continuing Foley catheter through discharge to be further evaluated at the urology clinic as an outpatient.  HTN: - Cleviprex infusion for SBP goal < 160  - Continue home amlodipine, metoprolo. Will increase amlodipine to 10 mg today.  Schizophrenia - continue lithium and Zyprexa through tube   Best practice (evaluated daily)  Diet: NPO Pain/Anxiety/Delirium protocol (if indicated): off sedation currently VAP protocol (if indicated): Per protocol DVT prophylaxis: SCDs GI prophylaxis: PPI Glucose control: SSI Mobility: BR Disposition:ICU  Goals of Care:  Last date of multidisciplinary goals of care discussion: per primary Family and staff present:  Summary of discussion:  Follow up goals of care discussion due: 3/3 Code Status: FULL  Labs   CBC: Recent Labs  Lab 12/14/20 1835 12/14/20 1902 12/14/20 2308 12/15/20 1522 12/15/20 1901 12/15/20 2105 12/16/20 0414  WBC 8.2  --  6.6  --   --   --  15.3*  NEUTROABS 4.9  --  4.1  --   --   --   --   HGB 12.4   < > 11.5* 12.2 13.9 13.6 10.8*  HCT 39.7   < > 37.9 36.0 41.0 40.0 33.4*  MCV 78.0*  --  78.6*  --   --   --  76.1*  PLT 278  --  233  --   --   --  235   < > = values in this interval not displayed.    Basic Metabolic Panel: Recent Labs  Lab 12/14/20 1835 12/14/20 1902 12/15/20 1522 12/15/20 1901 12/15/20 2105 12/16/20 0414 12/16/20 1622 12/17/20 0242  NA 135 138 142 141 142 140  --  139  K 3.9 3.8 3.7 4.0 3.6 3.2*  --  4.1  CL 101 98  --   --   --  110  --  111  CO2 27  --   --   --   --  21*  --  21*  GLUCOSE 101* 96  --   --   --  103*  --  113*  BUN 10 12  --   --   --  11  --  19  CREATININE 0.90 0.90  --   --   --  0.93  --  0.81  CALCIUM 9.2  --   --   --   --  8.3*  --  8.4*  MG  --   --   --   --   --   --  1.8 2.0   PHOS  --   --   --   --   --   --  4.3 3.8   GFR: Estimated Creatinine Clearance: 62.1 mL/min (by C-G formula based on SCr of 0.81 mg/dL). Recent Labs  Lab 12/14/20 1835 12/14/20 2308 12/16/20 0414  WBC 8.2 6.6 15.3*    Liver Function Tests: Recent Labs  Lab 12/14/20 1835  AST 14*  ALT 10  ALKPHOS 92  BILITOT 0.5  PROT 6.8  ALBUMIN 3.3*   No results for input(s): LIPASE, AMYLASE in the last 168 hours. No results  for input(s): AMMONIA in the last 168 hours.  ABG    Component Value Date/Time   PHART 7.427 12/15/2020 2105   PCO2ART 37.0 12/15/2020 2105   PO2ART 98 12/15/2020 2105   HCO3 24.4 12/15/2020 2105   TCO2 25 12/15/2020 2105   ACIDBASEDEF 1.0 12/15/2020 1901   O2SAT 98.0 12/15/2020 2105     Coagulation Profile: Recent Labs  Lab 12/14/20 1835  INR 1.0    Cardiac Enzymes: No results for input(s): CKTOTAL, CKMB, CKMBINDEX, TROPONINI in the last 168 hours.  HbA1C: Hgb A1c MFr Bld  Date/Time Value Ref Range Status  12/14/2020 11:08 PM 5.9 (H) 4.8 - 5.6 % Final    Comment:    (NOTE) Pre diabetes:          5.7%-6.4%  Diabetes:              >6.4%  Glycemic control for   <7.0% adults with diabetes   10/30/2012 06:10 AM 6.1 (H) <5.7 % Final    Comment:    (NOTE)                                                                       According to the ADA Clinical Practice Recommendations for 2011, when HbA1c is used as a screening test:  >=6.5%   Diagnostic of Diabetes Mellitus           (if abnormal result is confirmed) 5.7-6.4%   Increased risk of developing Diabetes Mellitus References:Diagnosis and Classification of Diabetes Mellitus,Diabetes DUKG,2542,70(WCBJS 1):S62-S69 and Standards of Medical Care in         Diabetes - 2011,Diabetes EGBT,5176,16 (Suppl 1):S11-S61.    CBG: Recent Labs  Lab 12/16/20 1530 12/16/20 1931 12/16/20 2311 12/17/20 0317 12/17/20 0730  GLUCAP 102* 110* 123* 95 200*     Critical care time    The patient  is critically ill due to encephalopathy, respiratory failure.  Critical care was necessary to treat or prevent imminent or life-threatening deterioration.  Critical care was time spent personally by me on the following activities: development of treatment plan with patient and/or surrogate as well as nursing, discussions with consultants, evaluation of patient's response to treatment, examination of patient, obtaining history from patient or surrogate, ordering and performing treatments and interventions, ordering and review of laboratory studies, ordering and review of radiographic studies, pulse oximetry, re-evaluation of patient's condition and participation in multidisciplinary rounds.   Critical Care Time devoted to patient care services described in this note is 33 minutes. This time reflects time of care of this [place] . This critical care time does not reflect separately billable procedures or procedure time, teaching time or supervisory time of PA/NP/Med student/Med Resident etc but could involve care discussion time.       [facility] 12/17/2020 11:31 AM  Pager: see AMION After hours pager: [phone]  If no response to pager , please call [phone] until 7pm After 7:00 pm call Elink  [phone]

## 2020-12-17 NOTE — Progress Notes (Signed)
PT Cancellation Note  Patient Details Name: Shannon [name] MRN: [id number] DOB: 08-Sep-1951   Cancelled Treatment:    Reason Eval/Treat Not Completed: Patient not medically ready as she remains intubated and and unable to tolerate activity for PT evaluation at this time. PT will continue to follow and evaluate as appropriate.   Hardie Pulley, DPT   Acute Rehabilitation Department Pager #: [phone]   Otho Bellows 12/17/2020, 11:43 AM

## 2020-12-17 NOTE — Progress Notes (Signed)
PT Cancellation Note  Patient Details Name: Shannon [name] MRN: [id number] DOB: August 01, 1951   Cancelled Treatment:    Reason Eval/Treat Not Completed: Active bedrest order this morning. PT will continue to follow and evaluate as appropriate.   Hardie Pulley, DPT   Acute Rehabilitation Department Pager #: [phone]   Otho Bellows 12/17/2020, 7:37 AM

## 2020-12-17 NOTE — Progress Notes (Signed)
OT Cancellation Note  Patient Details Name: Shannon [name] MRN: [id number] DOB: 1951-01-17   Cancelled Treatment:    Reason Eval/Treat Not Completed: Patient not medically ready;Medical issues which prohibited therapy (Pt intubated. Pt not stable enough for movement beyond vitals and nursing assessment,)  Jefferey Pica, OTR/L [place] Pager: [phone] Office: [phone]  ALLISON C 12/17/2020, 11:45 AM

## 2020-12-18 DIAGNOSIS — I671 Cerebral aneurysm, nonruptured: Secondary | ICD-10-CM | POA: Diagnosis not present

## 2020-12-18 DIAGNOSIS — J9601 Acute respiratory failure with hypoxia: Secondary | ICD-10-CM | POA: Diagnosis not present

## 2020-12-18 LAB — GLUCOSE, CAPILLARY
Glucose-Capillary: 139 mg/dL — ABNORMAL HIGH (ref 70–99)
Glucose-Capillary: 137 mg/dL — ABNORMAL HIGH (ref 70–99)
Glucose-Capillary: 130 mg/dL — ABNORMAL HIGH (ref 70–99)
Glucose-Capillary: 93 mg/dL (ref 70–99)
Glucose-Capillary: 96 mg/dL (ref 70–99)
Glucose-Capillary: 111 mg/dL — ABNORMAL HIGH (ref 70–99)

## 2020-12-18 LAB — PHOSPHORUS: Phosphorus: 3 mg/dL (ref 2.5–4.6)

## 2020-12-18 LAB — MAGNESIUM: Magnesium: 2 mg/dL (ref 1.7–2.4)

## 2020-12-18 NOTE — Progress Notes (Signed)
PT Cancellation Note  Patient Details Name: ERICKA MARCELLUS MRN: [id number] DOB: 1951/08/22   Cancelled Treatment:    Reason Eval/Treat Not Completed: Patient not medically ready;Medical issues which prohibited therapy. Per RN pt becomes bradycardic to 20s with bed mobility. Requesting PT hold at this time. PT will follow up when medically appropriate to mobilize.   Zenaida Niece 12/18/2020, 3:30 PM

## 2020-12-18 NOTE — Progress Notes (Signed)
NAME:  Shannon [name], MRN:  [id number], DOB:  [birth year], LOS: 4 ADMISSION DATE:  12/14/2020, CONSULTATION DATE: 2/24 REFERRING MD: Dr. Christella Noa, CHIEF COMPLAINT: Postop respiratory failure  Brief History:  70 year old female who presented with double vision found to have a left P-comm aneurysm and a right posterior fossa meningioma.  She underwent left pterional craniotomy for clipping of posterior communicating artery aneurysm on 2/24.  History of Present Illness:  Patient is encephalopathic and/or intubated. Therefore history has been obtained from chart review.  70 year old female with past medical history as below, which is significant for diabetes, hyperlipidemia, hypertension, and schizophrenia.  She initially presented to her ophthalmology office on 2/23 with complaints of double vision.  The ophthalmologist identified a left 3rd cranial nerve palsy and directed the patient immediately to the emergency department.  In the emergency department she complained of double vision and headache behind the left eye.  She CT angiogram was done and identified a left P-comm aneurysm and a right posterior fossa meningioma.  She was admitted by the neurosurgery team and on 2/24 she was taken to the operating room for left pterional craniotomy and clipping of the posterior communicating artery aneurysm.  Postoperatively she was very slow to arouse post anesthesia.  For this reason she was left on the ventilator and transferred to neurosurgical ICU where PCCM was consulted.  Past Medical History:   has a past medical history of Anxiety, Depression, Diabetes mellitus, Hyperlipidemia, Hypertension, and Schizophrenia ([place]).   Significant Hospital Events:  2/23 presented for double vision and headache, found to have P-comm aneurysm 2/24 2 OR for clipping  Consults:  Neurosurgery Urology  Procedures:  2/24 ET tube  Significant Diagnostic Tests:  CT head 2/23> 2.6 x 3.2 cm extra-axial mass in the far  right lateral posterior fossa consistent with meningioma.  Old small vessel infarctions of both thalami. CT angio head, neck 2/23> 3 x 3 left P-comm origin aneurysm.  No intracranial arterial occlusion or high-grade stenosis. MRI brain 2/23> no acute intracranial abnormality, right posterior fossa meningioma. CT angiogram head 2/24 > status post left pterional craniotomy for clipping of left posterior communicating artery aneurysm.  No evidence of continued aneurysm opacification.  Micro Data:    Antimicrobials:     Interim History / Subjective:  No overnight issues. Awakens to tactile stimuli, more so than yesterday  Objective   Blood pressure (!) 132/51, pulse 64, temperature 99.9 F (37.7 C), temperature source Axillary, resp. rate (!) 27, height 5' 2.99" (1.6 m), weight 70.3 kg, SpO2 100 %.    Vent Mode: PSV;CPAP FiO2 (%):  [30 %-40 %] 40 % Set Rate:  [20 bmp] 20 bmp Vt Set:  [420 mL] 420 mL PEEP:  [5 cmH20] 5 cmH20 Pressure Support:  [5 cmH20-10 cmH20] 5 cmH20 Plateau Pressure:  [14 cmH20-19 cmH20] 14 cmH20   Intake/Output Summary (Last 24 hours) at 12/18/2020 1246 Last data filed at 12/18/2020 1200 Gross per 24 hour  Intake 3090.89 ml  Output 575 ml  Net 2515.89 ml   Filed Weights   12/17/20 0225 12/17/20 0400 12/18/20 0500  Weight: 67.1 kg 69.3 kg 70.3 kg    Examination: General: middle aged female, intubated Lungs: Clear, unlabored Cardiovascular: RRR, no MRG Abdomen: Soft, non-tender, non-distended Extremities: No acute deformity Neuro: off sedation, opens right eye to verbal stimuli, follows commands, moves all 4 extremities  Resolved Hospital Problem list     Assessment & Plan:   Left posterior communicating artery aneurysm: s/p clip 2/24 -  Management per neurosurgery   Acute hypoxemic respiratory failure - maintain vent support for LTVV. Continue to monitor off sedation - SBT today, will consider extubation. Mental status precluding currently  DM -  CBG monitoring and SSI  Difficult Foley placement: Foley catheter was placed by urology in the OR. - Urology recommends continuing Foley catheter through discharge to be further evaluated at the urology clinic as an outpatient.  HTN: - Cleviprex infusion for SBP goal < 160  - Continue amlodipine and metoprolol. cleviprex is off.   Schizophrenia - continue lithium and Zyprexa through tube   Best practice (evaluated daily)  Diet: NPO Pain/Anxiety/Delirium protocol (if indicated): off sedation currently VAP protocol (if indicated): Per protocol DVT prophylaxis: SCDs GI prophylaxis: PPI Glucose control: SSI Mobility: BR Disposition:ICU  Goals of Care:  Last date of multidisciplinary goals of care discussion: per primary Family and staff present:  Summary of discussion:  Follow up goals of care discussion due: 3/3 Code Status: FULL  Labs   CBC: Recent Labs  Lab 12/14/20 1835 12/14/20 1902 12/14/20 2308 12/15/20 1522 12/15/20 1901 12/15/20 2105 12/16/20 0414  WBC 8.2  --  6.6  --   --   --  15.3*  NEUTROABS 4.9  --  4.1  --   --   --   --   HGB 12.4   < > 11.5* 12.2 13.9 13.6 10.8*  HCT 39.7   < > 37.9 36.0 41.0 40.0 33.4*  MCV 78.0*  --  78.6*  --   --   --  76.1*  PLT 278  --  233  --   --   --  235   < > = values in this interval not displayed.    Basic Metabolic Panel: Recent Labs  Lab 12/14/20 1835 12/14/20 1902 12/15/20 1522 12/15/20 1901 12/15/20 2105 12/16/20 0414 12/16/20 1622 12/17/20 0242 12/17/20 1655 12/18/20 0508  NA 135 138 142 141 142 140  --  139  --   --   K 3.9 3.8 3.7 4.0 3.6 3.2*  --  4.1  --   --   CL 101 98  --   --   --  110  --  111  --   --   CO2 27  --   --   --   --  21*  --  21*  --   --   GLUCOSE 101* 96  --   --   --  103*  --  113*  --   --   BUN 10 12  --   --   --  11  --  19  --   --   CREATININE 0.90 0.90  --   --   --  0.93  --  0.81  --   --   CALCIUM 9.2  --   --   --   --  8.3*  --  8.4*  --   --   MG  --   --    --   --   --   --  1.8 2.0 2.0 2.0  PHOS  --   --   --   --   --   --  4.3 3.8 3.0 3.0   GFR: Estimated Creatinine Clearance: 62.5 mL/min (by C-G formula based on SCr of 0.81 mg/dL). Recent Labs  Lab 12/14/20 1835 12/14/20 2308 12/16/20 0414  WBC 8.2 6.6 15.3*    Liver Function Tests: Recent Labs  Lab 12/14/20 1835  AST 14*  ALT 10  ALKPHOS 92  BILITOT 0.5  PROT 6.8  ALBUMIN 3.3*   No results for input(s): LIPASE, AMYLASE in the last 168 hours. No results for input(s): AMMONIA in the last 168 hours.  ABG    Component Value Date/Time   PHART 7.427 12/15/2020 2105   PCO2ART 37.0 12/15/2020 2105   PO2ART 98 12/15/2020 2105   HCO3 24.4 12/15/2020 2105   TCO2 25 12/15/2020 2105   ACIDBASEDEF 1.0 12/15/2020 1901   O2SAT 98.0 12/15/2020 2105     Coagulation Profile: Recent Labs  Lab 12/14/20 1835  INR 1.0    Cardiac Enzymes: No results for input(s): CKTOTAL, CKMB, CKMBINDEX, TROPONINI in the last 168 hours.  HbA1C: Hgb A1c MFr Bld  Date/Time Value Ref Range Status  12/14/2020 11:08 PM 5.9 (H) 4.8 - 5.6 % Final    Comment:    (NOTE) Pre diabetes:          5.7%-6.4%  Diabetes:              >6.4%  Glycemic control for   <7.0% adults with diabetes   10/30/2012 06:10 AM 6.1 (H) <5.7 % Final    Comment:    (NOTE)                                                                       According to the ADA Clinical Practice Recommendations for 2011, when HbA1c is used as a screening test:  >=6.5%   Diagnostic of Diabetes Mellitus           (if abnormal result is confirmed) 5.7-6.4%   Increased risk of developing Diabetes Mellitus References:Diagnosis and Classification of Diabetes Mellitus,Diabetes RDEY,8144,81(EHUDJ 1):S62-S69 and Standards of Medical Care in         Diabetes - 2011,Diabetes SHFW,2637,85 (Suppl 1):S11-S61.    CBG: Recent Labs  Lab 12/17/20 1909 12/17/20 2353 12/18/20 0432 12/18/20 0732 12/18/20 1107  GLUCAP 164* 148* 139* 137* 130*      Critical care time    The patient is critically ill due to encephalopathy, respiratory failure.  Critical care was necessary to treat or prevent imminent or life-threatening deterioration.  Critical care was time spent personally by me on the following activities: development of treatment plan with patient and/or surrogate as well as nursing, discussions with consultants, evaluation of patient's response to treatment, examination of patient, obtaining history from patient or surrogate, ordering and performing treatments and interventions, ordering and review of laboratory studies, ordering and review of radiographic studies, pulse oximetry, re-evaluation of patient's condition and participation in multidisciplinary rounds.   Critical Care Time devoted to patient care services described in this note is 32 minutes. This time reflects time of care of this [place] . This critical care time does not reflect separately billable procedures or procedure time, teaching time or supervisory time of PA/NP/Med student/Med Resident etc but could involve care discussion time.       [facility] 12/18/2020 12:46 PM  Pager: see AMION After hours pager: [phone]  If no response to pager , please call [phone] until 7pm After 7:00 pm call Elink  [phone]

## 2020-12-18 NOTE — Progress Notes (Signed)
Patient ID: Shannon [name], female   DOB: 12-14-1950, 70 y.o.   MRN: [id number] Vital signs are stable Patient remains deeply comatose.  There is some spontaneous movement to painful stimulation in the left side.  Pupils are 2 mm and reactive.  Continue supportive care

## 2020-12-19 DIAGNOSIS — I671 Cerebral aneurysm, nonruptured: Secondary | ICD-10-CM | POA: Diagnosis not present

## 2020-12-19 DIAGNOSIS — J9601 Acute respiratory failure with hypoxia: Secondary | ICD-10-CM | POA: Diagnosis not present

## 2020-12-19 LAB — TYPE AND SCREEN
ABO/RH(D): A POS
Antibody Screen: NEGATIVE
Unit division: 0
Unit division: 0
Unit division: 0
Unit division: 0

## 2020-12-19 LAB — BPAM RBC
Blood Product Expiration Date: 202203142359
Blood Product Expiration Date: 202203142359
Blood Product Expiration Date: 202203142359
Blood Product Expiration Date: 202203142359
ISSUE DATE / TIME: 202202241436
ISSUE DATE / TIME: 202202241436
Unit Type and Rh: 6200
Unit Type and Rh: 6200
Unit Type and Rh: 6200
Unit Type and Rh: 6200

## 2020-12-19 LAB — GLUCOSE, CAPILLARY
Glucose-Capillary: 141 mg/dL — ABNORMAL HIGH (ref 70–99)
Glucose-Capillary: 157 mg/dL — ABNORMAL HIGH (ref 70–99)
Glucose-Capillary: 124 mg/dL — ABNORMAL HIGH (ref 70–99)
Glucose-Capillary: 115 mg/dL — ABNORMAL HIGH (ref 70–99)
Glucose-Capillary: 128 mg/dL — ABNORMAL HIGH (ref 70–99)

## 2020-12-19 MED ORDER — ATROPINE SULFATE 1 MG/ML IJ SOLN
INTRAMUSCULAR | Status: AC
  Filled 2020-12-19: qty 1

## 2020-12-19 MED ORDER — HEPARIN SODIUM (PORCINE) 5000 UNIT/ML IJ SOLN
5000.0000 [IU] | Freq: Three times a day (TID) | INTRAMUSCULAR | Status: DC
  Administered 2020-12-19 – 2020-12-28 (×26): 5000 [IU] via SUBCUTANEOUS
  Filled 2020-12-19 (×26): qty 1

## 2020-12-19 MED ORDER — HYDRALAZINE HCL 20 MG/ML IJ SOLN
10.0000 mg | INTRAMUSCULAR | Status: DC | PRN
  Administered 2020-12-20 – 2020-12-21 (×2): 10 mg via INTRAVENOUS
  Filled 2020-12-19 (×2): qty 1

## 2020-12-19 MED ORDER — ATROPINE SULFATE 1 MG/10ML IJ SOSY
PREFILLED_SYRINGE | INTRAMUSCULAR | Status: AC
  Filled 2020-12-19: qty 10

## 2020-12-19 MED ORDER — INSULIN ASPART 100 UNIT/ML ~~LOC~~ SOLN
0.0000 [IU] | Freq: Three times a day (TID) | SUBCUTANEOUS | Status: DC
  Administered 2020-12-19 – 2020-12-25 (×6): 1 [IU] via SUBCUTANEOUS

## 2020-12-19 MED ORDER — INSULIN ASPART 100 UNIT/ML ~~LOC~~ SOLN: 0.0000 [IU] | Freq: Three times a day (TID) | SUBCUTANEOUS | Status: DC

## 2020-12-19 NOTE — Progress Notes (Signed)
eLink Physician-Brief Progress Note Patient Name: Shannon [name] DOB: 06/13/1951 MRN: [id number]   Date of Service  12/19/2020  HPI/Events of Note  Notified of SBP 169 on maximum of Cleviprex with SBP goal of <160 BP currently 158/46  HR 78 Bradyarrhythmias as per notes. Metoprolol placed on hold  eICU Interventions  Ordered hydralazine 10 mg IV prn if SBP persistently above 160     Intervention Category Major Interventions: Hypertension - evaluation and management  Shona Needles [place] 12/19/2020, 10:58 PM

## 2020-12-19 NOTE — Progress Notes (Addendum)
  NEUROSURGERY PROGRESS NOTE   No issues overnight.  EXAM:  BP (!) 136/52   Pulse 74   Temp 98.9 F (37.2 C) (Axillary)   Resp (!) 23   Ht 5' 2.99" (1.6 m) Comment: previous admit  Wt 70.3 kg   SpO2 99%   BMI 27.46 kg/m    Intubated Tongue edematous Opens right eye to pain, reactive, left CN3 palsy, pupil reactive Follows commands briskly with all four extremities (grip, toes) headwrap in place. No bleeding noted  IMPRESSION/PLAN 70 y.o. female POD#4 s/p left craniotomy for clipping of Pcom aneurysm. bradycardia - continue supportive care, keppra - vent per CCM. Appreciate assistance   =================================================================  I have seen and examined Shannon [name] and agree with the exam, impression, and plan as documented in the note by Ferne Reus, PA-C.  POD# 4 s/p clipping LPcom aneurysm. Neurologically appears to be at baseline, easily arouses to voice, briskly FC, stable L CNIII palsy. Has had episodes of bradycardia with spontaneous resolution, appear to be in relation to ETT manipulation or suctioning suggesting vagal response rather than underlying cardiac conduction abnormality.  - Spoke with Dr. Doyne Keel, will plan on extubating today - Cont Keppra - PT/OT assuming she tolerates extubation  [name], MD [facility]

## 2020-12-19 NOTE — Progress Notes (Signed)
OT Cancellation Note  Patient Details Name: Shannon [name] MRN: [id number] DOB: Feb 13, 1951   Cancelled Treatment:    Reason Eval/Treat Not Completed: Patient not medically ready;Active bedrest order/ bradycardia OT order received and appreciated however this conflicts with current bedrest order set. Please increase activity tolerance as appropriate and remove bedrest from orders. . Please contact OT at [phone] if bed rest order is discontinued. OT will hold evaluation at this time and will check back as time allows pending increased activity orders.   Billey Chang, OTR/L  Acute Rehabilitation Services Pager: [phone] Office: [phone] .  12/19/2020, 8:30 AM

## 2020-12-19 NOTE — Procedures (Signed)
Extubation Procedure Note  Patient Details:   Name: Shannon [name] DOB: March 27, 1951 MRN: [id number]   Airway Documentation:    Vent end date: 12/19/20 Vent end time: 0930   Evaluation  O2 sats: stable throughout Complications: No apparent complications Patient did tolerate procedure well. Bilateral Breath Sounds: Rhonchi,Diminished   Yes   RT extubated patient to 5 L [place] per MD order with RN at bedside. Even though patients tongue is enlarged patient had positive cuff leak. Patient able to speak. MD and RN currently bedside. No severe distress noted at this time. RT will continue to monitor as needed.  Fabiola Backer 12/19/2020, 11:07 AM

## 2020-12-19 NOTE — Progress Notes (Signed)
PT Cancellation Note  Patient Details Name: Shannon [name] MRN: [id number] DOB: December 26, 1950   Cancelled Treatment:    Reason Eval/Treat Not Completed: Patient not medically ready;Medical issues which prohibited therapy.  Pt just extubated this am and not ready to be seen per RN.  Unable to get back today and pt still on bedrest.  Please update activity orders so that therapies are able to see pt 3/1 if appropriate. 12/19/2020  Ginger Carne., PT Acute Rehabilitation Services [phone]  (pager) [phone]  (office)   [name] 12/19/2020, 5:33 PM

## 2020-12-19 NOTE — Progress Notes (Signed)
NAME:  Shannon [name], MRN:  [id number], DOB:  Jun 02, 1951, LOS: 5 ADMISSION DATE:  12/14/2020, CONSULTATION DATE: 2/24 REFERRING MD: Dr. Christella Noa, CHIEF COMPLAINT: Postop respiratory failure  Brief History:  70 year old female who presented with double vision found to have a left P-comm aneurysm and a right posterior fossa meningioma.  She underwent left pterional craniotomy for clipping of posterior communicating artery aneurysm on 2/24.  History of Present Illness:  Patient is encephalopathic and/or intubated. Therefore history has been obtained from chart review.  70 year old female with past medical history as below, which is significant for diabetes, hyperlipidemia, hypertension, and schizophrenia.  She initially presented to her ophthalmology office on 2/23 with complaints of double vision.  The ophthalmologist identified a left 3rd cranial nerve palsy and directed the patient immediately to the emergency department.  In the emergency department she complained of double vision and headache behind the left eye.  She CT angiogram was done and identified a left P-comm aneurysm and a right posterior fossa meningioma.  She was admitted by the neurosurgery team and on 2/24 she was taken to the operating room for left pterional craniotomy and clipping of the posterior communicating artery aneurysm.  Postoperatively she was very slow to arouse post anesthesia.  For this reason she was left on the ventilator and transferred to neurosurgical ICU where PCCM was consulted.  Past Medical History:   has a past medical history of Anxiety, Depression, Diabetes mellitus, Hyperlipidemia, Hypertension, and Schizophrenia ([place]).   Significant Hospital Events:  2/23 presented for double vision and headache, found to have P-comm aneurysm 2/24 2 OR for clipping  Consults:  Neurosurgery Urology  Procedures:  2/24 ET tube  Significant Diagnostic Tests:  CT head 2/23> 2.6 x 3.2 cm extra-axial mass in the far  right lateral posterior fossa consistent with meningioma.  Old small vessel infarctions of both thalami. CT angio head, neck 2/23> 3 x 3 left P-comm origin aneurysm.  No intracranial arterial occlusion or high-grade stenosis. MRI brain 2/23> no acute intracranial abnormality, right posterior fossa meningioma. CT angiogram head 2/24 > status post left pterional craniotomy for clipping of left posterior communicating artery aneurysm.  No evidence of continued aneurysm opacification.  Micro Data:  None   Antimicrobials:  None  Interim History / Subjective:  Will follow commands. Episodes of transient bradycardia, asystole with coughing and suctioning.  Objective   Blood pressure (!) 136/52, pulse 74, temperature 99 F (37.2 C), temperature source Axillary, resp. rate (!) 23, height 5' 2.99" (1.6 m), weight 70.3 kg, SpO2 99 %.    Vent Mode: PRVC FiO2 (%):  [40 %] 40 % Set Rate:  [20 bmp] 20 bmp Vt Set:  [420 mL] 420 mL PEEP:  [5 cmH20] 5 cmH20 Pressure Support:  [5 cmH20-10 cmH20] 10 cmH20 Plateau Pressure:  [16 cmH20] 16 cmH20   Intake/Output Summary (Last 24 hours) at 12/19/2020 0901 Last data filed at 12/19/2020 0600 Gross per 24 hour  Intake 1757.68 ml  Output 900 ml  Net 857.68 ml   Filed Weights   12/17/20 0225 12/17/20 0400 12/18/20 0500  Weight: 67.1 kg 69.3 kg 70.3 kg    Examination: General: middle aged female, intubated Lungs: Clear, unlabored. Tolerating SBT Cardiovascular: RRR, no MRG Abdomen: Soft, non-tender, non-distended Extremities: No acute deformity Neuro: off sedation, opens right eye to verbal stimuli, follows commands, moves all 4 extremities. Able to tolerate sitting in bed.   Resolved Hospital Problem list     Assessment & Plan:  Status post elective clipping of left unruptured PCOM aneurysm.  Critically Acute hypoxemic respiratory failure requiring mechanical ventilation .  Vagally mediated bradycardia related to laryngeal stimulation.   DM Difficult Foley placement: Foley catheter was placed by urology in the OR. HTN: Schizophrenia   Plan: - Mental status sufficient for extubation trial particularly in light of vagal bradyarrhythmias - Stop metoprolol - continue lithium and Zyprexa    Daily Goals Checklist  Pain/Anxiety/Delirium protocol (if indicated): off all sedation Neuro vitals: every 4 hours AED's: Keppra VAP protocol (if indicated): bundle in place Respiratory support goals: extubate Blood pressure target: SBP<160 DVT prophylaxis: heparin tid Nutrition Status:  SLP once extubated.  GI prophylaxis: Protonix Fluid status goals: allow autoregulation Urinary catheter: keep current catheter for now as difficult placement Central lines: PIV, Arterial line Glucose control: euglycemic Mobility/therapy needs: PT/OT once extubated.  Antibiotic de-escalation: none Home medication reconciliation: continue home psychiatric medications.  Daily labs: CBC, BMP qMTh Code Status: Full Family Communication: will updated following extubation.  Disposition: ICU   Goals of Care:  Last date of multidisciplinary goals of care discussion: per primary Family and staff present:  Summary of discussion:  Follow up goals of care discussion due: 3/3 Code Status: FULL  Labs   CBC: Recent Labs  Lab 12/14/20 1835 12/14/20 1902 12/14/20 2308 12/15/20 1522 12/15/20 1901 12/15/20 2105 12/16/20 0414  WBC 8.2  --  6.6  --   --   --  15.3*  NEUTROABS 4.9  --  4.1  --   --   --   --   HGB 12.4   < > 11.5* 12.2 13.9 13.6 10.8*  HCT 39.7   < > 37.9 36.0 41.0 40.0 33.4*  MCV 78.0*  --  78.6*  --   --   --  76.1*  PLT 278  --  233  --   --   --  235   < > = values in this interval not displayed.    Basic Metabolic Panel: Recent Labs  Lab 12/14/20 1835 12/14/20 1902 12/15/20 1522 12/15/20 1901 12/15/20 2105 12/16/20 0414 12/16/20 1622 12/17/20 0242 12/17/20 1655 12/18/20 0508  NA 135 138 142 141 142 140  --   139  --   --   K 3.9 3.8 3.7 4.0 3.6 3.2*  --  4.1  --   --   CL 101 98  --   --   --  110  --  111  --   --   CO2 27  --   --   --   --  21*  --  21*  --   --   GLUCOSE 101* 96  --   --   --  103*  --  113*  --   --   BUN 10 12  --   --   --  11  --  19  --   --   CREATININE 0.90 0.90  --   --   --  0.93  --  0.81  --   --   CALCIUM 9.2  --   --   --   --  8.3*  --  8.4*  --   --   MG  --   --   --   --   --   --  1.8 2.0 2.0 2.0  PHOS  --   --   --   --   --   --  4.3 3.8  3.0 3.0   GFR: Estimated Creatinine Clearance: 62.5 mL/min (by C-G formula based on SCr of 0.81 mg/dL). Recent Labs  Lab 12/14/20 1835 12/14/20 2308 12/16/20 0414  WBC 8.2 6.6 15.3*    Liver Function Tests: Recent Labs  Lab 12/14/20 1835  AST 14*  ALT 10  ALKPHOS 92  BILITOT 0.5  PROT 6.8  ALBUMIN 3.3*   No results for input(s): LIPASE, AMYLASE in the last 168 hours. No results for input(s): AMMONIA in the last 168 hours.  ABG    Component Value Date/Time   PHART 7.427 12/15/2020 2105   PCO2ART 37.0 12/15/2020 2105   PO2ART 98 12/15/2020 2105   HCO3 24.4 12/15/2020 2105   TCO2 25 12/15/2020 2105   ACIDBASEDEF 1.0 12/15/2020 1901   O2SAT 98.0 12/15/2020 2105     Coagulation Profile: Recent Labs  Lab 12/14/20 1835  INR 1.0    Cardiac Enzymes: No results for input(s): CKTOTAL, CKMB, CKMBINDEX, TROPONINI in the last 168 hours.  HbA1C: Hgb A1c MFr Bld  Date/Time Value Ref Range Status  12/14/2020 11:08 PM 5.9 (H) 4.8 - 5.6 % Final    Comment:    (NOTE) Pre diabetes:          5.7%-6.4%  Diabetes:              >6.4%  Glycemic control for   <7.0% adults with diabetes   10/30/2012 06:10 AM 6.1 (H) <5.7 % Final    Comment:    (NOTE)                                                                       According to the ADA Clinical Practice Recommendations for 2011, when HbA1c is used as a screening test:  >=6.5%   Diagnostic of Diabetes Mellitus           (if abnormal result is  confirmed) 5.7-6.4%   Increased risk of developing Diabetes Mellitus References:Diagnosis and Classification of Diabetes Mellitus,Diabetes BWGY,6599,35(TSVXB 1):S62-S69 and Standards of Medical Care in         Diabetes - 2011,Diabetes LTJQ,3009,23 (Suppl 1):S11-S61.    CBG: Recent Labs  Lab 12/18/20 1506 12/18/20 1954 12/18/20 2326 12/19/20 0318 12/19/20 0740  GLUCAP 93 96 111* 141* 157*     Critical care time    The patient is critically ill due to encephalopathy, respiratory failure.  Critical care was necessary to treat or prevent imminent or life-threatening deterioration.  Critical care was time spent personally by me on the following activities: development of treatment plan with patient and/or surrogate as well as nursing, discussions with consultants, evaluation of patient's response to treatment, examination of patient, obtaining history from patient or surrogate, ordering and performing treatments and interventions, ordering and review of laboratory studies, ordering and review of radiographic studies, pulse oximetry, re-evaluation of patient's condition and participation in multidisciplinary rounds.   Critical Care Time devoted to patient care services described in this note is 35 minutes. This time reflects time of care of this [place] . This critical care time does not reflect separately billable procedures or procedure time, teaching time or supervisory time of PA/NP/Med student/Med Resident etc but could involve care discussion time.       Einar Grad  [facility] [place] [facility] 12/19/2020 9:01 AM  Pager: see AMION After hours pager: [phone]  If no response to pager , please call [phone] until 7pm After 7:00 pm call Elink  [phone]

## 2020-12-20 ENCOUNTER — Inpatient Hospital Stay (HOSPITAL_COMMUNITY): Payer: Medicare Other

## 2020-12-20 DIAGNOSIS — L8990 Pressure ulcer of unspecified site, unspecified stage: Secondary | ICD-10-CM | POA: Insufficient documentation

## 2020-12-20 DIAGNOSIS — I671 Cerebral aneurysm, nonruptured: Secondary | ICD-10-CM | POA: Diagnosis not present

## 2020-12-20 LAB — CBC
HCT: 33.5 % — ABNORMAL LOW (ref 36.0–46.0)
Hemoglobin: 10.7 g/dL — ABNORMAL LOW (ref 12.0–15.0)
MCH: 24.3 pg — ABNORMAL LOW (ref 26.0–34.0)
MCHC: 31.9 g/dL (ref 30.0–36.0)
MCV: 76.1 fL — ABNORMAL LOW (ref 80.0–100.0)
Platelets: 153 10*3/uL (ref 150–400)
RBC: 4.4 MIL/uL (ref 3.87–5.11)
RDW: 15.4 % (ref 11.5–15.5)
WBC: 13 10*3/uL — ABNORMAL HIGH (ref 4.0–10.5)
nRBC: 0 % (ref 0.0–0.2)

## 2020-12-20 LAB — BASIC METABOLIC PANEL
Anion gap: 10 (ref 5–15)
BUN: 5 mg/dL — ABNORMAL LOW (ref 8–23)
CO2: 28 mmol/L (ref 22–32)
Calcium: 8.9 mg/dL (ref 8.9–10.3)
Chloride: 102 mmol/L (ref 98–111)
Creatinine, Ser: 0.45 mg/dL (ref 0.44–1.00)
GFR, Estimated: >60 mL/min (ref >60)
Glucose, Bld: 116 mg/dL — ABNORMAL HIGH (ref 70–99)
Potassium: 3.4 mmol/L — ABNORMAL LOW (ref 3.5–5.1)
Sodium: 140 mmol/L (ref 135–145)

## 2020-12-20 LAB — POCT I-STAT 7, (LYTES, BLD GAS, ICA,H+H)
Acid-Base Excess: 3 mmol/L — ABNORMAL HIGH (ref 0.0–2.0)
Bicarbonate: 27.8 mmol/L (ref 20.0–28.0)
Calcium, Ion: 1.23 mmol/L (ref 1.15–1.40)
HCT: 33 % — ABNORMAL LOW (ref 36.0–46.0)
Hemoglobin: 11.2 g/dL — ABNORMAL LOW (ref 12.0–15.0)
O2 Saturation: 94 %
Patient temperature: 98.1
Potassium: 4 mmol/L (ref 3.5–5.1)
Sodium: 138 mmol/L (ref 135–145)
TCO2: 29 mmol/L (ref 22–32)
pCO2 arterial: 40.5 mmHg (ref 32.0–48.0)
pH, Arterial: 7.444 (ref 7.350–7.450)
pO2, Arterial: 68 mmHg — ABNORMAL LOW (ref 83.0–108.0)

## 2020-12-20 LAB — GLUCOSE, CAPILLARY
Glucose-Capillary: 119 mg/dL — ABNORMAL HIGH (ref 70–99)
Glucose-Capillary: 131 mg/dL — ABNORMAL HIGH (ref 70–99)
Glucose-Capillary: 106 mg/dL — ABNORMAL HIGH (ref 70–99)

## 2020-12-20 LAB — TRIGLYCERIDES: Triglycerides: 90 mg/dL (ref <150)

## 2020-12-20 MED ORDER — DEXAMETHASONE SODIUM PHOSPHATE 10 MG/ML IJ SOLN
INTRAMUSCULAR | Status: AC
  Filled 2020-12-20: qty 1

## 2020-12-20 MED ORDER — ORAL CARE MOUTH RINSE
15.0000 mL | Freq: Two times a day (BID) | OROMUCOSAL | Status: DC
  Administered 2020-12-20 – 2020-12-28 (×7): 15 mL via OROMUCOSAL

## 2020-12-20 MED ORDER — MIDAZOLAM HCL 2 MG/2ML IJ SOLN: 2.0000 mg | Freq: Once | INTRAMUSCULAR | Status: DC

## 2020-12-20 MED ORDER — FENTANYL CITRATE (PF) 100 MCG/2ML IJ SOLN
INTRAMUSCULAR | Status: AC
  Filled 2020-12-20: qty 2

## 2020-12-20 MED ORDER — SUCCINYLCHOLINE CHLORIDE 20 MG/ML IJ SOLN
100.0000 mg | Freq: Once | INTRAMUSCULAR | Status: DC
  Filled 2020-12-20: qty 5

## 2020-12-20 MED ORDER — SODIUM CHLORIDE 0.9 % IV SOLN: INTRAVENOUS | Status: DC

## 2020-12-20 MED ORDER — SUCCINYLCHOLINE CHLORIDE 200 MG/10ML IV SOSY
PREFILLED_SYRINGE | INTRAVENOUS | Status: AC
  Filled 2020-12-20: qty 10

## 2020-12-20 MED ORDER — ETOMIDATE 2 MG/ML IV SOLN
INTRAVENOUS | Status: AC
  Filled 2020-12-20: qty 20

## 2020-12-20 MED ORDER — ROCURONIUM BROMIDE 10 MG/ML (PF) SYRINGE
PREFILLED_SYRINGE | INTRAVENOUS | Status: AC
  Filled 2020-12-20: qty 10

## 2020-12-20 MED ORDER — CHLORHEXIDINE GLUCONATE 0.12 % MT SOLN
15.0000 mL | Freq: Two times a day (BID) | OROMUCOSAL | Status: DC
  Administered 2020-12-20 – 2020-12-27 (×14): 15 mL via OROMUCOSAL
  Filled 2020-12-20 (×11): qty 15

## 2020-12-20 MED ORDER — MIDAZOLAM HCL 2 MG/2ML IJ SOLN
INTRAMUSCULAR | Status: AC
  Filled 2020-12-20: qty 4

## 2020-12-20 MED ORDER — ETOMIDATE 2 MG/ML IV SOLN: 20.0000 mg | Freq: Once | INTRAVENOUS | Status: DC

## 2020-12-20 MED ORDER — DEXAMETHASONE SODIUM PHOSPHATE 10 MG/ML IJ SOLN
10.0000 mg | Freq: Once | INTRAMUSCULAR | Status: AC
  Administered 2020-12-20: 10 mg via INTRAVENOUS

## 2020-12-20 MED ORDER — POTASSIUM CHLORIDE 10 MEQ/100ML IV SOLN
10.0000 meq | INTRAVENOUS | Status: AC
  Administered 2020-12-20 (×4): 10 meq via INTRAVENOUS
  Filled 2020-12-20 (×4): qty 100

## 2020-12-20 MED ORDER — LABETALOL HCL 5 MG/ML IV SOLN
10.0000 mg | INTRAVENOUS | Status: DC | PRN
  Administered 2020-12-20 (×2): 10 mg via INTRAVENOUS
  Filled 2020-12-20: qty 4

## 2020-12-20 MED ORDER — HALOPERIDOL LACTATE 5 MG/ML IJ SOLN
2.0000 mg | Freq: Four times a day (QID) | INTRAMUSCULAR | Status: DC | PRN
  Administered 2020-12-20: 2 mg via INTRAVENOUS
  Filled 2020-12-20: qty 1

## 2020-12-20 NOTE — Evaluation (Signed)
Occupational Therapy Evaluation Patient Details Name: Shannon [name] MRN: [id number] DOB: 1951-02-22 Today's Date: 12/20/2020    History of Present Illness Patient is a 70 y/o female who presents with double vision, found to have a left P-comm aneurysm and a right posterior fossa meningioma. s/p left pterional craniotomy for clipping of posterior communicating artery aneurysm on 2/24. Extubated 12/19/20. PMH includes schizophrenia, HTN, HLD, depression, anxiety.   Clinical Impression   PT admitted with craniotomy due to aneurysm and meningoma . Pt currently with functional limitiations due to the deficits listed below (see OT problem list). Pt requires total +2 mod (A) for transfers for safety. Pt oriented xx4 with soft voice quality and increased RR. Pt will benefit from skilled OT to increase their independence and safety with adls and balance to allow discharge CIR.     Follow Up Recommendations  CIR    Equipment Recommendations  3 in 1 bedside commode    Recommendations for Other Services Rehab consult     Precautions / Restrictions Precautions Precautions: Fall;Other (comment) Precaution Comments: watch RR, restless SPP <160 Restrictions Weight Bearing Restrictions: No      Mobility Bed Mobility Overal bed mobility: Needs Assistance Bed Mobility: Sidelying to Sit   Sidelying to sit: Mod assist;HOB elevated       General bed mobility comments: Assist with trunk to get to EOB, able to bring LEs to EOB without assist. Impulsively trying to stand prior to therapists ready.    Transfers Overall transfer level: Needs assistance Equipment used: 1 person hand held assist;2 person hand held assist Transfers: Sit to/from [facility] Sit to Stand: Mod assist Stand pivot transfers: Mod assist;+2 safety/equipment       General transfer comment: Impulsively standing from EOB with Mod A to power up; SPT bed to chair with Mod A for balance/support as pt with  weakness, sitting prematurely. Left knee instability noted. Able to help reposition self in chair with cues. RR in 30s mostly, up to 48 with activity. Sp02 stable in 90s on 4L/min 02 [place].    Balance Overall balance assessment: Needs assistance Sitting-balance support: Feet supported;No upper extremity supported Sitting balance-Leahy Scale: Fair Sitting balance - Comments: Close Min guard for sitting balance; impulsive/restless   Standing balance support: During functional activity Standing balance-Leahy Scale: Poor Standing balance comment: Requires external support in standing.                           ADL either performed or assessed with clinical judgement   ADL Overall ADL's : Needs assistance/impaired Eating/Feeding: NPO   Grooming: Maximal assistance   Upper Body Bathing: Cueing for safety   Lower Body Bathing: With adaptive equipment           Toilet Transfer: +2 for physical assistance;Moderate assistance             General ADL Comments: pt with RR elevated and soft voice quality. Pt the same supine as in the chair. RN aware     Vision   Additional Comments: difficult to assess but tracking therapist in room when name called. Pt closing eyes and laying head down     Perception     Praxis      Pertinent Vitals/Pain Pain Assessment: Faces Faces Pain Scale: Hurts a little bit Pain Location: generalized with movement Pain Descriptors / Indicators: Grimacing;Guarding Pain Intervention(s): Monitored during session;Repositioned     Hand Dominance Right   Extremity/Trunk Assessment Upper  Extremity Assessment Upper Extremity Assessment: Generalized weakness   Lower Extremity Assessment Lower Extremity Assessment: Generalized weakness   Cervical / Trunk Assessment Cervical / Trunk Assessment: Normal   Communication Communication Communication: Expressive difficulties (does not have teeth in, swollen tongue)   Cognition Arousal/Alertness:  Awake/alert Behavior During Therapy: Restless;Impulsive Overall Cognitive Status: Impaired/Different from baseline Area of Impairment: Attention;Following commands;Safety/judgement;Awareness;Problem solving                   Current Attention Level: Sustained   Following Commands: Follows one step commands with increased time Safety/Judgement: Decreased awareness of safety Awareness: Emergent Problem Solving: Requires verbal cues General Comments: A&Ox4. Restless and impulsive at times during session.   General Comments  RR 30-48 with activity O2 remains 90 4L 02 [place]    Exercises     Shoulder Instructions      Home Living Family/patient expects to be discharged to:: Private residence Living Arrangements: Non-relatives/Friends Available Help at Discharge: Friend(s);Available PRN/intermittently Type of Home: Apartment Home Access: Level entry     Home Layout: One level         Bathroom Toilet: Standard     Home Equipment: Cane - single point          Prior Functioning/Environment Level of Independence: Independent        Comments: On disability. Does not drive.        OT Problem List: Decreased strength;Decreased activity tolerance;Impaired balance (sitting and/or standing);Decreased cognition;Decreased safety awareness;Decreased knowledge of use of DME or AE;Decreased knowledge of precautions;Cardiopulmonary status limiting activity      OT Treatment/Interventions: Self-care/ADL training;Therapeutic exercise;Neuromuscular education;DME and/or AE instruction;Energy conservation;Manual therapy;Modalities;Therapeutic activities;Cognitive remediation/compensation;Patient/family education;Balance training    OT Goals(Current goals can be found in the care plan section) Acute Rehab OT Goals Patient Stated Goal: to get up OT Goal Formulation: With patient Time For Goal Achievement: 01/03/21 Potential to Achieve Goals: Good  OT Frequency: Min 2X/week    Barriers to D/C:            Co-evaluation PT/OT/SLP Co-Evaluation/Treatment: Yes Reason for Co-Treatment: Necessary to address cognition/behavior during functional activity;For patient/therapist safety;To address functional/ADL transfers PT goals addressed during session: Balance;Mobility/safety with mobility OT goals addressed during session: ADL's and self-care;Proper use of Adaptive equipment and DME;Strengthening/ROM      AM-PAC OT "6 Clicks" Daily Activity     Outcome Measure Help from another person eating meals?: A Lot Help from another person taking care of personal grooming?: A Lot Help from another person toileting, which includes using toliet, bedpan, or urinal?: A Lot Help from another person bathing (including washing, rinsing, drying)?: A Lot Help from another person to put on and taking off regular upper body clothing?: A Lot Help from another person to put on and taking off regular lower body clothing?: A Lot 6 Click Score: 12   End of Session Equipment Utilized During Treatment: Oxygen Nurse Communication: Mobility status;Precautions  Activity Tolerance: Patient tolerated treatment well Patient left: in chair;with call bell/phone within [name];with chair alarm set;with restraints reapplied  OT Visit Diagnosis: Unsteadiness on feet (R26.81);Muscle weakness (generalized) (M62.81)                Time: 1110-1130 OT Time Calculation (min): 20 min Charges:  OT General Charges $OT Visit: 1 Visit OT Evaluation $OT Eval Moderate Complexity: 1 Mod   Brynn, OTR/L  Acute Rehabilitation Services Pager: [phone] Office: [phone] .   Jeri Modena 12/20/2020, 4:28 PM

## 2020-12-20 NOTE — Evaluation (Signed)
Physical Therapy Evaluation Patient Details Name: Shannon [name] MRN: [id number] DOB: Mar 24, 1951 Today's Date: 12/20/2020   History of Present Illness  Patient is a 70 y/o female who presents with double vision, found to have a left P-comm aneurysm and a right posterior fossa meningioma. s/p left pterional craniotomy for clipping of posterior communicating artery aneurysm on 2/24. Extubated 12/19/20. PMH includes schizophrenia, HTN, HLD, depression, anxiety.  Clinical Impression  Patient presents with generalized weakness (Left>right), impaired balance, restlessness, decreased respiratory status, decreased activity tolerance and impaired mobility s/p above. Pt lives at home with a friend and reports being independent for ADLs PTA. Pt does not drive and is on disability. Requires mod A for bed mobility, standing and transfer to a chair. RR ranged from 30s-48 with activity; Sp02 remained in 90s on 4L/min 02 [place]. Pt noted to be impulsive and restless during session needing cues for safety. Would benefit from intensive rehab to maximize independence and mobility prior to return home. Will follow acutely.    Follow Up Recommendations CIR;Supervision for mobility/OOB    Equipment Recommendations  Other (comment) (TBA)    Recommendations for Other Services Rehab consult     Precautions / Restrictions Precautions Precautions: Fall;Other (comment) Precaution Comments: watch RR, restless, SBP <160 Restrictions Weight Bearing Restrictions: No      Mobility  Bed Mobility Overal bed mobility: Needs Assistance Bed Mobility: Sidelying to Sit   Sidelying to sit: Mod assist;HOB elevated       General bed mobility comments: Assist with trunk to get to EOB, able to bring LEs to EOB without assist. Impulsively trying to stand prior to therapists ready.    Transfers Overall transfer level: Needs assistance Equipment used: 1 person hand held assist;2 person hand held assist Transfers: Sit to/from  [facility] Sit to Stand: Mod assist Stand pivot transfers: Mod assist;+2 safety/equipment       General transfer comment: Impulsively standing from EOB with Mod A to power up; SPT bed to chair with Mod A for balance/support as pt with weakness, sitting prematurely. Left knee instability noted. Able to help reposition self in chair with cues. RR in 30s mostly, up to 48 with activity. Sp02 stable in 90s on 4L/min 02 [place].  Ambulation/Gait             General Gait Details: Deferred due to respiratory status, increased RR.  Stairs            Wheelchair Mobility    Modified Rankin (Stroke Patients Only) Modified Rankin (Stroke Patients Only) Pre-Morbid Rankin Score: Slight disability Modified Rankin: Severe disability     Balance Overall balance assessment: Needs assistance Sitting-balance support: Feet supported;No upper extremity supported Sitting balance-Leahy Scale: Fair Sitting balance - Comments: Close Min guard for sitting balance; impulsive/restless   Standing balance support: During functional activity Standing balance-Leahy Scale: Poor Standing balance comment: Requires external support in standing.                             Pertinent Vitals/Pain Pain Assessment: Faces Faces Pain Scale: Hurts a little bit Pain Location: generalized with movement Pain Descriptors / Indicators: Grimacing;Guarding Pain Intervention(s): Monitored during session;Repositioned    Home Living Family/patient expects to be discharged to:: Private residence Living Arrangements: Non-relatives/Friends Available Help at Discharge: Friend(s);Available PRN/intermittently Type of Home: Apartment Home Access: Level entry     Home Layout: One level Home Equipment: Cane - single point  Prior Function Level of Independence: Independent         Comments: On disability. Does not drive.     Hand Dominance   Dominant Hand: Right     Extremity/Trunk Assessment   Upper Extremity Assessment Upper Extremity Assessment: Defer to OT evaluation    Lower Extremity Assessment Lower Extremity Assessment: Generalized weakness       Communication   Communication: Expressive difficulties (does not have teeth in, swollen tongue)  Cognition Arousal/Alertness: Awake/alert Behavior During Therapy: Restless;Impulsive Overall Cognitive Status: Impaired/Different from baseline Area of Impairment: Attention;Following commands;Safety/judgement;Awareness;Problem solving                   Current Attention Level: Sustained   Following Commands: Follows one step commands with increased time Safety/Judgement: Decreased awareness of safety Awareness: Emergent Problem Solving: Requires verbal cues General Comments: A&Ox4. Restless and impulsive at times during session.      General Comments General comments (skin integrity, edema, etc.): RR ranged from 30-48 with activity. Sp02 remained in 90s on 4L/min 02 [place].    Exercises     Assessment/Plan    PT Assessment Patient needs continued PT services  PT Problem List Decreased strength;Decreased mobility;Decreased safety awareness;Decreased range of motion;Decreased balance;Decreased cognition;Decreased activity tolerance;Cardiopulmonary status limiting activity       PT Treatment Interventions Therapeutic exercise;DME instruction;Gait training;Balance training;Functional mobility training;Therapeutic activities;Patient/family education;Cognitive remediation;Neuromuscular re-education;Stair training    PT Goals (Current goals can be found in the Care Plan section)  Acute Rehab PT Goals Patient Stated Goal: to get up PT Goal Formulation: With patient Time For Goal Achievement: 01/03/21 Potential to Achieve Goals: Good    Frequency Min 4X/week   Barriers to discharge Decreased caregiver support unsure of level of support from friend?    Co-evaluation PT/OT/SLP  Co-Evaluation/Treatment: Yes Reason for Co-Treatment: To address functional/ADL transfers;Complexity of the patient's impairments (multi-system involvement);Necessary to address cognition/behavior during functional activity PT goals addressed during session: Balance;Mobility/safety with mobility         AM-PAC PT "6 Clicks" Mobility  Outcome Measure Help needed turning from your back to your side while in a flat bed without using bedrails?: A Little Help needed moving from lying on your back to sitting on the side of a flat bed without using bedrails?: A Lot Help needed moving to and from a bed to a chair (including a wheelchair)?: A Lot Help needed standing up from a chair using your arms (e.g., wheelchair or bedside chair)?: A Lot Help needed to walk in hospital room?: A Lot Help needed climbing 3-5 steps with a railing? : Total 6 Click Score: 12    End of Session Equipment Utilized During Treatment: Oxygen Activity Tolerance: Other (comment) (limited by RR, restlessness) Patient left: in chair;with call bell/phone within reach;with chair alarm set;with restraints reapplied Nurse Communication: Mobility status;Other (comment) (restlessness) PT Visit Diagnosis: Muscle weakness (generalized) (M62.81);Difficulty in walking, not elsewhere classified (R26.2);Unsteadiness on feet (R26.81)    Time: 1110-1130 PT Time Calculation (min) (ACUTE ONLY): 20 min   Charges:   PT Evaluation $PT Eval Moderate Complexity: 1 Mod          Marisa Severin, PT, DPT Acute Rehabilitation Services Pager [phone] Office [place] 12/20/2020, 1:09 PM

## 2020-12-20 NOTE — Progress Notes (Signed)
NAME:  Shannon [name], MRN:  [id number], DOB:  [birth year], LOS: 6 ADMISSION DATE:  12/14/2020, CONSULTATION DATE: 2/24 REFERRING MD: Dr. Christella Noa, CHIEF COMPLAINT: Postop respiratory failure  Brief History:  70 year old female who presented with double vision found to have a left P-comm aneurysm and a right posterior fossa meningioma.  She underwent left pterional craniotomy for clipping of posterior communicating artery aneurysm on 2/24.  History of Present Illness:  Patient is encephalopathic and/or intubated. Therefore history has been obtained from chart review.  70 year old female with past medical history as below, which is significant for diabetes, hyperlipidemia, hypertension, and schizophrenia.  She initially presented to her ophthalmology office on 2/23 with complaints of double vision.  The ophthalmologist identified a left 3rd cranial nerve palsy and directed the patient immediately to the emergency department.  In the emergency department she complained of double vision and headache behind the left eye.  She CT angiogram was done and identified a left P-comm aneurysm and a right posterior fossa meningioma.  She was admitted by the neurosurgery team and on 2/24 she was taken to the operating room for left pterional craniotomy and clipping of the posterior communicating artery aneurysm.  Postoperatively she was very slow to arouse post anesthesia.  For this reason she was left on the ventilator and transferred to neurosurgical ICU where PCCM was consulted.  Past Medical History:   has a past medical history of Anxiety, Depression, Diabetes mellitus, Hyperlipidemia, Hypertension, and Schizophrenia ([place]).   Significant Hospital Events:  2/23 presented for double vision and headache, found to have P-comm aneurysm 2/24 2 OR for clipping  Consults:  Neurosurgery Urology  Procedures:  2/24 ET tube  Significant Diagnostic Tests:  CT head 2/23> 2.6 x 3.2 cm extra-axial mass in the far  right lateral posterior fossa consistent with meningioma.  Old small vessel infarctions of both thalami. CT angio head, neck 2/23> 3 x 3 left P-comm origin aneurysm.  No intracranial arterial occlusion or high-grade stenosis. MRI brain 2/23> no acute intracranial abnormality, right posterior fossa meningioma. CT angiogram head 2/24 > status post left pterional craniotomy for clipping of left posterior communicating artery aneurysm.  No evidence of continued aneurysm opacification.  Micro Data:  None   Antimicrobials:  None  Interim History / Subjective:  Extubated yesterday. Remains restless. Continued tachypnea.   Objective   Blood pressure (!) 170/68, pulse 86, temperature 98.2 F (36.8 C), temperature source Axillary, resp. rate (!) 27, height 5' 2.99" (1.6 m), weight 70.3 kg, SpO2 94 %.        Intake/Output Summary (Last 24 hours) at 12/20/2020 0956 Last data filed at 12/20/2020 0800 Gross per 24 hour  Intake 1086.33 ml  Output 3225 ml  Net -2138.67 ml   Filed Weights   12/17/20 0225 12/17/20 0400 12/18/20 0500  Weight: 67.1 kg 69.3 kg 70.3 kg    Examination: General: middle aged female, restless in bed.  Lungs: Clear chest. ++ upper airway secretions, large tongue with ++ secretions. Sonorous breathing improves with jaw thrust.  Cardiovascular: RRR, no MRG Abdomen: Soft, non-tender, non-distended Extremities: No acute deformity Neuro: awake and follows commands. Speech garbled. Restless, moves all limbs.    Resolved Hospital Problem list     Assessment & Plan:   Status post elective clipping of left unruptured PCOM aneurysm.  Remains critically ill due to acute hypoxic respiratory insufficiency, remains high risk for intubation due to airway loss. Macroglossia secondary to orotracheal intubation. Vagally mediated bradycardia related to laryngeal stimulation -  now resolved following extubation.  DM Difficult Foley placement: Foley catheter was placed by urology in  the OR. HTN Schizophrenia   Plan: -Allow tongue swelling to spontaneously resolve. -Encourage sitting position and deep breathing -Not yet safe to swallow, would avoid NG tube for now as patient may be able to swallow spontaneously in the next few days and insertion of a nasogastric tube may further compromise breathing   Daily Goals Checklist  Pain/Anxiety/Delirium protocol (if indicated): off all sedation Neuro vitals: every 4 hours AED's: Keppra VAP protocol (if indicated): Now extubate Respiratory support goals: Wean oxygen as tolerated, suction secretions Blood pressure target: SBP<160 DVT prophylaxis: heparin tid Nutrition Status:  SLP once extubated and respiratory status improves.  Hold enteral medication for now.  Core track tube tomorrow if swallowing remains unsafe. GI prophylaxis: Not indicated Fluid status goals: allow autoregulation Urinary catheter: keep current catheter for now as difficult placement Central lines: PIV, Arterial line Glucose control: euglycemic Mobility/therapy needs: PT/OT once extubated.  Antibiotic de-escalation: none Home medication reconciliation: continue home psychiatric medications.  Daily labs: CBC, BMP qMTh Code Status: Full Family Communication: attempted to update patient's sister. Disposition: ICU until respiratory status improves.    Goals of Care:  Last date of multidisciplinary goals of care discussion: per primary Family and staff present:  Summary of discussion:  Follow up goals of care discussion due: 3/3 Code Status: FULL  Labs   CBC: Recent Labs  Lab 12/14/20 1835 12/14/20 1902 12/14/20 2308 12/15/20 1522 12/15/20 1901 12/15/20 2105 12/16/20 0414 12/20/20 0256  WBC 8.2  --  6.6  --   --   --  15.3* 13.0*  NEUTROABS 4.9  --  4.1  --   --   --   --   --   HGB 12.4   < > 11.5* 12.2 13.9 13.6 10.8* 10.7*  HCT 39.7   < > 37.9 36.0 41.0 40.0 33.4* 33.5*  MCV 78.0*  --  78.6*  --   --   --  76.1* 76.1*  PLT 278   --  233  --   --   --  235 153   < > = values in this interval not displayed.    Basic Metabolic Panel: Recent Labs  Lab 12/14/20 1835 12/14/20 1902 12/15/20 1522 12/15/20 1901 12/15/20 2105 12/16/20 0414 12/16/20 1622 12/17/20 0242 12/17/20 1655 12/18/20 0508 12/20/20 0256  NA 135 138   < > 141 142 140  --  139  --   --  140  K 3.9 3.8   < > 4.0 3.6 3.2*  --  4.1  --   --  3.4*  CL 101 98  --   --   --  110  --  111  --   --  102  CO2 27  --   --   --   --  21*  --  21*  --   --  28  GLUCOSE 101* 96  --   --   --  103*  --  113*  --   --  116*  BUN 10 12  --   --   --  11  --  19  --   --  5*  CREATININE 0.90 0.90  --   --   --  0.93  --  0.81  --   --  0.45  CALCIUM 9.2  --   --   --   --  8.3*  --  8.4*  --   --  8.9  MG  --   --   --   --   --   --  1.8 2.0 2.0 2.0  --   PHOS  --   --   --   --   --   --  4.3 3.8 3.0 3.0  --    < > = values in this interval not displayed.   GFR: Estimated Creatinine Clearance: 63.3 mL/min (by C-G formula based on SCr of 0.45 mg/dL). Recent Labs  Lab 12/14/20 1835 12/14/20 2308 12/16/20 0414 12/20/20 0256  WBC 8.2 6.6 15.3* 13.0*    Liver Function Tests: Recent Labs  Lab 12/14/20 1835  AST 14*  ALT 10  ALKPHOS 92  BILITOT 0.5  PROT 6.8  ALBUMIN 3.3*   No results for input(s): LIPASE, AMYLASE in the last 168 hours. No results for input(s): AMMONIA in the last 168 hours.  ABG    Component Value Date/Time   PHART 7.427 12/15/2020 2105   PCO2ART 37.0 12/15/2020 2105   PO2ART 98 12/15/2020 2105   HCO3 24.4 12/15/2020 2105   TCO2 25 12/15/2020 2105   ACIDBASEDEF 1.0 12/15/2020 1901   O2SAT 98.0 12/15/2020 2105     Coagulation Profile: Recent Labs  Lab 12/14/20 1835  INR 1.0    Cardiac Enzymes: No results for input(s): CKTOTAL, CKMB, CKMBINDEX, TROPONINI in the last 168 hours.  HbA1C: Hgb A1c MFr Bld  Date/Time Value Ref Range Status  12/14/2020 11:08 PM 5.9 (H) 4.8 - 5.6 % Final    Comment:    (NOTE) Pre  diabetes:          5.7%-6.4%  Diabetes:              >6.4%  Glycemic control for   <7.0% adults with diabetes   10/30/2012 06:10 AM 6.1 (H) <5.7 % Final    Comment:    (NOTE)                                                                       According to the ADA Clinical Practice Recommendations for 2011, when HbA1c is used as a screening test:  >=6.5%   Diagnostic of Diabetes Mellitus           (if abnormal result is confirmed) 5.7-6.4%   Increased risk of developing Diabetes Mellitus References:Diagnosis and Classification of Diabetes Mellitus,Diabetes KDXI,3382,50(NLZJQ 1):S62-S69 and Standards of Medical Care in         Diabetes - 2011,Diabetes BHAL,9379,02 (Suppl 1):S11-S61.    CBG: Recent Labs  Lab 12/19/20 0740 12/19/20 1126 12/19/20 1458 12/19/20 2132 12/20/20 0615  GLUCAP 157* 124* 115* 128* 119*    CRITICAL CARE Performed by: Kipp Brood   Total critical care time: 35 minutes  Critical care time was exclusive of separately billable procedures and treating other patients.  Critical care was necessary to treat or prevent imminent or life-threatening deterioration.  Critical care was time spent personally by me on the following activities: development of treatment plan with patient and/or surrogate as well as nursing, discussions with consultants, evaluation of patient's response to treatment, examination of patient, obtaining history from patient or surrogate, ordering and performing treatments and interventions, ordering and review  of laboratory studies, ordering and review of radiographic studies, pulse oximetry, re-evaluation of patient's condition and participation in multidisciplinary rounds.  Kipp Brood, MD [facility] ICU Physician [place]  Pager: [phone] Mobile: [phone] After hours: [phone].

## 2020-12-20 NOTE — Progress Notes (Signed)
PT Cancellation Note  Patient Details Name: Shannon [name] MRN: [id number] DOB: June 01, 1951   Cancelled Treatment:    Reason Eval/Treat Not Completed: Active bedrest order Pt continues to be on bedrest. Please increase activity orders prior to PT evaluation. Will follow.   Marguarite Arbour A Hartshorne 12/20/2020, 7:03 AM Marisa Severin, PT, DPT Acute Rehabilitation Services Pager [phone] Office [phone]

## 2020-12-20 NOTE — Progress Notes (Addendum)
[place] to room as pt restless, BP elevated, and O2 88% and dropping.  Pt placed on NRB, repositioned; O2 slowly rising; NTS without any output. Respiratory and R. Agarwala MD called to bedside, orders for Bipap. Patient placed on bipap, posey belt removed.  Orders for cxr and abg placed.  Sister Hassan Rowan at bedside and updated by this RN and MD Agarwala.   1500 PA V. Costella updated on above events.

## 2020-12-20 NOTE — Progress Notes (Signed)
RT called to patient room due to patient having a drop in oxygen saturations and increased work of breathing.  Upon arrival patient was noted to be in respiratory distress and on non-rebreather mask.  Orders placed for patient to go on bipap.  ABG was obtained.  Patient sat up straighter in bed and work of breathing began to ease.  Tolerating bipap well at this time.  Will continue to monitor.

## 2020-12-20 NOTE — Evaluation (Addendum)
Speech Language Cognitive Evaluation  Patient Details  Name: Shannon [name] MRN: [id number] Date of Birth: 1951/03/27  Today's Date: 12/20/2020 Time: SLP Start Time (ACUTE ONLY): 20 SLP Stop Time (ACUTE ONLY): 1116 SLP Time Calculation (min) (ACUTE ONLY): 11 min  Past Medical History:  Past Medical History:  Diagnosis Date  . Anxiety   . Depression   . Diabetes mellitus   . Hyperlipidemia   . Hypertension   . Schizophrenia [facility])    Past Surgical History:  Past Surgical History:  Procedure Laterality Date  . BREAST SURGERY    . CRANIOTOMY Left 12/15/2020   Procedure: LEFT CRANIOTOMY FOR INTRACRANIAL ANEURYSM;  Surgeon: [name], MD;  Location: [place];  Service: Neurosurgery;  Laterality: Left;  . EXPLORATORY LAPAROTOMY    . INSERTION OF SUPRAPUBIC CATHETER  12/15/2020   Procedure: INSERTION OF FOLEY CATHETER;  Surgeon: Remi Haggard, MD;  Location: [facility] OR;  Service: Urology;;   HPI:  70 year old female with PMH: diabetes, hyperlipidemia, hypertension, and schizophrenia. Pt found to have 3rd cranial nerve palsy 2/23 after complaints of double vision and directed pt to ED. CT angiogram was done revealing a left PC aneurysm and a right posterior fossa meningioma. On 2/24 underwent left pterional craniotomy and clipping of the posterior communicating artery aneurysm. Intubated 2/24-2/28.   Assessment / Plan / Recommendation Clinical Impression  Pt demonstrates significant dysarthria and mild cognitive impairments. Her ability to sustain attention was compromised impacted by restlessness, impulsivity and increased work of breathing at time of evaluation. Oriented x 4, needing frequent cues for sustained attention, calm herself and slow her respirations. Speech in imprecise impacted by endentulous tongue protruding mildly past lower lip. As she is better able to attend, pt can participate in further diagnostic treatment sessions. SLP Visit Diagnosis: Dysarthria and anarthria  (R47.1);Cognitive communication deficit (R41.841)    Aspiration Risk  Moderate aspiration risk    Diet Recommendation NPO   Medication Administration:  (use IV)    Other  Recommendations Oral Care Recommendations: Oral care QID   Follow up Recommendations Inpatient Rehab      Frequency and Duration min 2x/week  2 weeks       Prognosis Prognosis for Safe Diet Advancement: Good      Swallow Study   General Date of Onset: 12/15/20 HPI: 70 year old female with PMH: diabetes, hyperlipidemia, hypertension, and schizophrenia. Pt found to have 3rd cranial nerve palsy 2/23 after complaints of double vision and directed pt to ED. CT angiogram was done revealing a left PC aneurysm and a right posterior fossa meningioma. On 2/24 underwent left pterional craniotomy and clipping of the posterior communicating artery aneurysm. Intubated 2/24-2/28. Type of Study: Bedside Swallow Evaluation Previous Swallow Assessment:  (none) Diet Prior to this Study: NPO Temperature Spikes Noted: No Respiratory Status: Nasal cannula History of Recent Intubation: Yes Length of Intubations (days): 5 days Date extubated: 12/19/20 Behavior/Cognition: Alert;Cooperative;Pleasant mood;Requires cueing Oral Cavity Assessment: Other (comment) (lingual candidas) Oral Care Completed by SLP: Yes Oral Cavity - Dentition: Edentulous (has upper dentures-) Vision: Impaired for self-feeding (left eye closed) Self-Feeding Abilities: Needs assist;Able to feed self Patient Positioning: Upright in bed Baseline Vocal Quality: Low vocal intensity;Hoarse Volitional Cough: Weak Volitional Swallow: Able to elicit    Oral/Motor/Sensory Function Overall Oral Motor/Sensory Function: Mild impairment Facial ROM: Reduced left;Suspected CN VII (facial) dysfunction Facial Symmetry: Abnormal symmetry left;Suspected CN VII (facial) dysfunction Facial Strength: Suspected CN VII (facial) dysfunction;Reduced left Lingual Symmetry: Within  Functional Limits Lingual Strength: Reduced Mandible: Within  Functional Limits   Ice Chips Ice chips: Impaired Presentation: Spoon Oral Phase Impairments: Reduced lingual movement/coordination Oral Phase Functional Implications: Prolonged oral transit Pharyngeal Phase Impairments:  (no swallow initiated)   Thin Liquid Thin Liquid: Not tested    Nectar Thick Nectar Thick Liquid: Not tested   Honey Thick Honey Thick Liquid: Not tested   Puree Puree: Impaired Presentation: Spoon Oral Phase Impairments: Reduced lingual movement/coordination Oral Phase Functional Implications: Oral residue Pharyngeal Phase Impairments:  (no swallow initiated)   Solid     Solid: Not tested      [name] 12/20/2020,12:54 PM   [name].Ed Risk analyst [phone] Office [phone]

## 2020-12-20 NOTE — Progress Notes (Signed)
AM K+ 3.4 with creat 0.45 and GFR > 60. ELink CCM electrolyte protocol initiated.

## 2020-12-20 NOTE — Progress Notes (Signed)
Inpatient Rehab Admissions Coordinator Note:   Per therapy recommendations, pt was screened for CIR candidacy by Shann Medal, PT, DPT.  At this time note pt with respiratory decline, currently requiring bipap.  Will follow for 1 more day for stability before assessing for candidacy.  Please contact me with questions.   Shann Medal, PT, DPT [phone] 12/20/20 3:25 PM

## 2020-12-20 NOTE — Evaluation (Signed)
Clinical/Bedside Swallow Evaluation Patient Details  Name: Shannon [name] MRN: [id number] Date of Birth: [birth year]  Today's Date: 12/20/2020 Time: SLP Start Time (ACUTE ONLY): 1116 SLP Stop Time (ACUTE ONLY): 1104 SLP Time Calculation (min) (ACUTE ONLY): 9 min  Past Medical History:  Past Medical History:  Diagnosis Date  . Anxiety   . Depression   . Diabetes mellitus   . Hyperlipidemia   . Hypertension   . Schizophrenia [facility])    Past Surgical History:  Past Surgical History:  Procedure Laterality Date  . BREAST SURGERY    . CRANIOTOMY Left 12/15/2020   Procedure: LEFT CRANIOTOMY FOR INTRACRANIAL ANEURYSM;  Surgeon: [name], MD;  Location: [place];  Service: Neurosurgery;  Laterality: Left;  . EXPLORATORY LAPAROTOMY    . INSERTION OF SUPRAPUBIC CATHETER  12/15/2020   Procedure: INSERTION OF FOLEY CATHETER;  Surgeon: Remi Haggard, MD;  Location: [facility] OR;  Service: Urology;;   HPI:  70 year old female with PMH: diabetes, hyperlipidemia, hypertension, and schizophrenia. Pt found to have 3rd cranial nerve palsy 2/23 after complaints of double vision and directed pt to ED. CT angiogram was done revealing a left PC aneurysm and a right posterior fossa meningioma. On 2/24 underwent left pterional craniotomy and clipping of the posterior communicating artery aneurysm. Intubated 2/24-2/28.   Assessment / Plan / Recommendation Clinical Impression  Pt alert, followed commands but restless and respiratory rate fluctuating between 20-47 and obvious increased work of breathing. Her tongue is endematous and protrudes past lower lip; able to protrude further and lateralize with imprecision. Has upper dentures, not donned during evaluation. Bilateral brain site of lesion therefore facial weakness mostly symmetrical with minimally weaker on left. One ice chip and 1/3 teaspoon applesauce provided. Swallow initiation was not observed with ice and delayed attempts to transit applesauce therefore  orally suctioned. Recommend continue oral care, NPO, ice chip after oral care if appropriate to moisten mucosa and ST plans to follow up. SLP Visit Diagnosis: Dysphagia, unspecified (R13.10)    Aspiration Risk  Moderate aspiration risk    Diet Recommendation NPO   Medication Administration:  (use IV)    Other  Recommendations Oral Care Recommendations: Oral care QID   Follow up Recommendations Inpatient Rehab      Frequency and Duration min 2x/week  2 weeks       Prognosis Prognosis for Safe Diet Advancement: Good      Swallow Study   General Date of Onset: 12/15/20 HPI: 70 year old female with PMH: diabetes, hyperlipidemia, hypertension, and schizophrenia. Pt found to have 3rd cranial nerve palsy 2/23 after complaints of double vision and directed pt to ED. CT angiogram was done revealing a left PC aneurysm and a right posterior fossa meningioma. On 2/24 underwent left pterional craniotomy and clipping of the posterior communicating artery aneurysm. Intubated 2/24-2/28. Type of Study: Bedside Swallow Evaluation Previous Swallow Assessment:  (none) Diet Prior to this Study: NPO Temperature Spikes Noted: No Respiratory Status: Nasal cannula History of Recent Intubation: Yes Length of Intubations (days): 5 days Date extubated: 12/19/20 Behavior/Cognition: Alert;Cooperative;Pleasant mood;Requires cueing Oral Cavity Assessment: Other (comment) (lingual candidas) Oral Care Completed by SLP: Yes Oral Cavity - Dentition: Edentulous (has upper dentures-) Vision: Impaired for self-feeding (left eye closed) Self-Feeding Abilities: Needs assist;Able to feed self Patient Positioning: Upright in bed Baseline Vocal Quality: Low vocal intensity;Hoarse Volitional Cough: Weak Volitional Swallow: Able to elicit    Oral/Motor/Sensory Function Overall Oral Motor/Sensory Function: Mild impairment Facial ROM: Reduced left;Suspected CN VII (facial) dysfunction Facial Symmetry:  Abnormal  symmetry left;Suspected CN VII (facial) dysfunction Facial Strength: Suspected CN VII (facial) dysfunction;Reduced left Lingual Symmetry: Within Functional Limits Lingual Strength: Reduced Mandible: Within Functional Limits   Ice Chips Ice chips: Impaired Presentation: Spoon Oral Phase Impairments: Reduced lingual movement/coordination Oral Phase Functional Implications: Prolonged oral transit Pharyngeal Phase Impairments:  (no swallow initiated)   Thin Liquid Thin Liquid: Not tested    Nectar Thick Nectar Thick Liquid: Not tested   Honey Thick Honey Thick Liquid: Not tested   Puree Puree: Impaired Presentation: Spoon Oral Phase Impairments: Reduced lingual movement/coordination Oral Phase Functional Implications: Oral residue Pharyngeal Phase Impairments:  (no swallow initiated)   Solid     Solid: Not tested      [name] 12/20/2020,12:22 PM   [name].Ed Risk analyst [phone] Office [phone]

## 2020-12-20 NOTE — Progress Notes (Signed)
Patient has had 5 PIV in 5 days. It has been reported by nurse, Charlotte Crumb RN that PIV are being pulled out d/t patient's restlessness. Both PIV have been wrapped and are secure at this time. Instructed nurse to monitor PIV sites. VU. Fran Lowes, RN VAST

## 2020-12-21 DIAGNOSIS — I671 Cerebral aneurysm, nonruptured: Secondary | ICD-10-CM | POA: Diagnosis not present

## 2020-12-21 LAB — BASIC METABOLIC PANEL
Anion gap: 9 (ref 5–15)
BUN: 14 mg/dL (ref 8–23)
CO2: 26 mmol/L (ref 22–32)
Calcium: 8.8 mg/dL — ABNORMAL LOW (ref 8.9–10.3)
Chloride: 103 mmol/L (ref 98–111)
Creatinine, Ser: 0.53 mg/dL (ref 0.44–1.00)
GFR, Estimated: >60 mL/min (ref >60)
Glucose, Bld: 93 mg/dL (ref 70–99)
Potassium: 4.1 mmol/L (ref 3.5–5.1)
Sodium: 138 mmol/L (ref 135–145)

## 2020-12-21 LAB — GLUCOSE, CAPILLARY
Glucose-Capillary: 87 mg/dL (ref 70–99)
Glucose-Capillary: 90 mg/dL (ref 70–99)
Glucose-Capillary: 111 mg/dL — ABNORMAL HIGH (ref 70–99)
Glucose-Capillary: 84 mg/dL (ref 70–99)

## 2020-12-21 MED ORDER — AMLODIPINE BESYLATE 10 MG PO TABS
10.0000 mg | ORAL_TABLET | Freq: Every day | ORAL | Status: DC
  Administered 2020-12-21 – 2020-12-28 (×8): 10 mg via ORAL
  Filled 2020-12-21 (×8): qty 1

## 2020-12-21 MED ORDER — LITHIUM CARBONATE ER 300 MG PO TBCR
300.0000 mg | EXTENDED_RELEASE_TABLET | Freq: Two times a day (BID) | ORAL | Status: DC
  Administered 2020-12-21 – 2020-12-28 (×15): 300 mg via ORAL
  Filled 2020-12-21 (×17): qty 1

## 2020-12-21 MED ORDER — ENSURE ENLIVE PO LIQD
237.0000 mL | Freq: Two times a day (BID) | ORAL | Status: DC
  Administered 2020-12-22 – 2020-12-28 (×10): 237 mL via ORAL

## 2020-12-21 MED ORDER — VITAL HIGH PROTEIN PO LIQD: 1000.0000 mL | ORAL | Status: DC

## 2020-12-21 MED ORDER — OLANZAPINE 10 MG PO TABS
20.0000 mg | ORAL_TABLET | Freq: Every day | ORAL | Status: DC
  Administered 2020-12-21 – 2020-12-27 (×7): 20 mg via ORAL
  Filled 2020-12-21 (×4): qty 2
  Filled 2020-12-21: qty 4
  Filled 2020-12-21 (×4): qty 2
  Filled 2020-12-21 (×3): qty 4

## 2020-12-21 MED ORDER — DOCUSATE SODIUM 100 MG PO CAPS
100.0000 mg | ORAL_CAPSULE | Freq: Two times a day (BID) | ORAL | Status: DC
  Administered 2020-12-21 – 2020-12-23 (×3): 100 mg via ORAL
  Filled 2020-12-21 (×10): qty 1

## 2020-12-21 MED ORDER — ATORVASTATIN CALCIUM 10 MG PO TABS
20.0000 mg | ORAL_TABLET | Freq: Every day | ORAL | Status: DC
  Administered 2020-12-21 – 2020-12-28 (×8): 20 mg via ORAL
  Filled 2020-12-21 (×8): qty 2

## 2020-12-21 MED ORDER — POLYETHYLENE GLYCOL 3350 17 G PO PACK
17.0000 g | PACK | Freq: Every day | ORAL | Status: DC
  Filled 2020-12-21 (×4): qty 1

## 2020-12-21 MED ORDER — SENNA 8.6 MG PO TABS
1.0000 | ORAL_TABLET | Freq: Two times a day (BID) | ORAL | Status: DC
  Administered 2020-12-21 – 2020-12-23 (×3): 8.6 mg via ORAL
  Filled 2020-12-21 (×10): qty 1

## 2020-12-21 MED ORDER — PROSOURCE TF PO LIQD: 45.0000 mL | Freq: Two times a day (BID) | ORAL | Status: DC

## 2020-12-21 NOTE — Progress Notes (Signed)
Patient resting comfortably on nasal cannula. No respiratory distress noted. BIPAP not needed at this time. RT will monitor as needed. 

## 2020-12-21 NOTE — Progress Notes (Signed)
Physical Therapy Treatment Patient Details Name: Shannon [name] MRN: [id number] DOB: November 29, 1950 Today's Date: 12/21/2020    History of Present Illness Patient is a 70 y/o female who presents with double vision, found to have a left P-comm aneurysm and a right posterior fossa meningioma. s/p left pterional craniotomy for clipping of posterior communicating artery aneurysm on 2/24. Extubated 12/19/20. PMH includes schizophrenia, HTN, HLD, depression, anxiety.    PT Comments    Pt eager to get up and "get some food". Pt overall requiring min +2 assist for mobility tasks, demonstrating R inattention this day as well as impaired coordination bilaterally as assessed by finger-to-nose. Pt also demonstrating incoordination functionally, during both gait and seated LE exercises.  Pt remains impulsive and requires multimodal cues during mobility for safety. Continuing to recommend CIR post-acutely.    Follow Up Recommendations  CIR;Supervision for mobility/OOB     Equipment Recommendations  Other (comment) (TBA)    Recommendations for Other Services       Precautions / Restrictions Precautions Precautions: Fall;Other (comment) Precaution Comments: watch RR, restless SPP <160 Restrictions Weight Bearing Restrictions: No    Mobility  Bed Mobility Overal bed mobility: Needs Assistance Bed Mobility: Rolling;Supine to Sit Rolling: Min guard Sidelying to sit: Mod assist Supine to sit: Min assist;+2 for safety/equipment     General bed mobility comments: Min assist +2 for trunk elevation, LEs to EOB, cues for step-by-step sequencing to EOB.    Transfers Overall transfer level: Needs assistance Equipment used: Rolling walker (2 wheeled) Transfers: Sit to/from Stand Sit to Stand: +2 physical assistance;Min assist Stand pivot transfers: +2 physical assistance;Min assist       General transfer comment: min +2 for power up, rise, steady. Verbal cuing for hand placement when  rising/sitting.  Ambulation/Gait Ambulation/Gait assistance: Min assist;+2 physical assistance;+2 safety/equipment Gait Distance (Feet): 10 Feet Assistive device: Rolling walker (2 wheeled) Gait Pattern/deviations: Step-through pattern;Decreased stride length;Staggering left;Narrow base of support;Trunk flexed Gait velocity: decr   General Gait Details: Min +2 for steadying, placement in RW, managing lines/leads. Verbal cuing for widening BOS, upright posture, centering self in RW as pt with tendency to stand in L side of RW.   Stairs             Wheelchair Mobility    Modified Rankin (Stroke Patients Only) Modified Rankin (Stroke Patients Only) Pre-Morbid Rankin Score: Slight disability Modified Rankin: Moderately severe disability     Balance Overall balance assessment: Needs assistance Sitting-balance support: Bilateral upper extremity supported;Feet supported Sitting balance-Leahy Scale: Fair Sitting balance - Comments: able to sit EOB without support if not challenged, heavy anterior leaning intermittently requiring correction from PT/OT   Standing balance support: Bilateral upper extremity supported;During functional activity Standing balance-Leahy Scale: Poor Standing balance comment: reliant on external support                            Cognition Arousal/Alertness: Awake/alert Behavior During Therapy: Restless Overall Cognitive Status: Impaired/Different from baseline Area of Impairment: Attention                   Current Attention Level: Selective   Following Commands: Follows one step commands consistently Safety/Judgement: Decreased awareness of safety Awareness: Emergent Problem Solving: Requires verbal cues General Comments: oriented x4 and even able to describe surgery. Pt very much expressed wanting food and not eating in 4 days, pt very restless and needs cues for safety. pt with posey and chair alarm  for safety       Exercises      General Comments General comments (skin integrity, edema, etc.): BP sitting EOB 156/78, up in chair post-gait 170/62 (RN notified). RRmax 34 breaths/min, HR 70s, SpO2 WFL on 3LO2      Pertinent Vitals/Pain Pain Assessment: No/denies pain Faces Pain Scale: No hurt Pain Descriptors / Indicators: Grimacing;Guarding Pain Intervention(s): Limited activity within patient's tolerance;Monitored during session;Repositioned    Home Living                      Prior Function            PT Goals (current goals can now be found in the care plan section) Acute Rehab PT Goals Patient Stated Goal: to get up PT Goal Formulation: With patient Time For Goal Achievement: 01/03/21 Potential to Achieve Goals: Good Progress towards PT goals: Progressing toward goals    Frequency    Min 4X/week      PT Plan Current plan remains appropriate    Co-evaluation PT/OT/SLP Co-Evaluation/Treatment: Yes Reason for Co-Treatment: For patient/therapist safety;To address functional/ADL transfers   OT goals addressed during session: ADL's and self-care;Proper use of Adaptive equipment and DME;Strengthening/ROM      AM-PAC PT "6 Clicks" Mobility   Outcome Measure  Help needed turning from your back to your side while in a flat bed without using bedrails?: A Little Help needed moving from lying on your back to sitting on the side of a flat bed without using bedrails?: A Little Help needed moving to and from a bed to a chair (including a wheelchair)?: A Little Help needed standing up from a chair using your arms (e.g., wheelchair or bedside chair)?: A Little Help needed to walk in hospital room?: A Lot Help needed climbing 3-5 steps with a railing? : Total 6 Click Score: 15    End of Session Equipment Utilized During Treatment: Oxygen Activity Tolerance: Patient tolerated treatment well;Patient limited by fatigue (limited by RR, restlessness) Patient left: in chair;with  call bell/phone within reach;with chair alarm set;with restraints reapplied (posey waist belt) Nurse Communication: Mobility status;Other (comment) (restlessness) PT Visit Diagnosis: Muscle weakness (generalized) (M62.81);Difficulty in walking, not elsewhere classified (R26.2);Unsteadiness on feet (R26.81)     Time: 6415-8309 PT Time Calculation (min) (ACUTE ONLY): 25 min  Charges:  $Gait Training: 8-22 mins                     Stacie Glaze, PT Acute Rehabilitation Services Pager [phone]  Office [phone]    [place] 12/21/2020, 12:30 PM

## 2020-12-21 NOTE — Progress Notes (Signed)
  NEUROSURGERY PROGRESS NOTE   No issues overnight.   EXAM:  BP (!) 135/58   Pulse 69   Temp 98.4 F (36.9 C) (Oral)   Resp (!) 32   Ht 5' 2.99" (1.6 m) Comment: previous admit  Wt 70.3 kg   SpO2 100%   BMI 27.46 kg/m   Awake Tongue edematous Opens right eye to pain, reactive, left CN3 palsy, pupil reactive Follows commands briskly with all four extremities  IMPRESSION/PLAN 70 y.o. female POD# 5 s/p clipping LPcom aneurysm. Neurologically appears to be at baseline. - continue supportive care - appreciate CCM assistance

## 2020-12-21 NOTE — Progress Notes (Addendum)
NAME:  Shannon [name], MRN:  [id number], DOB:  Oct 23, 1950, LOS: 7 ADMISSION DATE:  12/14/2020, CONSULTATION DATE: 2/24 REFERRING MD: Dr. Christella Noa, CHIEF COMPLAINT: Postop respiratory failure  Brief History:  70 year old female who presented with double vision found to have a left P-comm aneurysm and a right posterior fossa meningioma.  She underwent left pterional craniotomy for clipping of posterior communicating artery aneurysm on 2/24.  History of Present Illness:  Patient is encephalopathic and/or intubated. Therefore history has been obtained from chart review.  70 year old female with past medical history as below, which is significant for diabetes, hyperlipidemia, hypertension, and schizophrenia.  She initially presented to her ophthalmology office on 2/23 with complaints of double vision.  The ophthalmologist identified a left 3rd cranial nerve palsy and directed the patient immediately to the emergency department.  In the emergency department she complained of double vision and headache behind the left eye.  She CT angiogram was done and identified a left P-comm aneurysm and a right posterior fossa meningioma.  She was admitted by the neurosurgery team and on 2/24 she was taken to the operating room for left pterional craniotomy and clipping of the posterior communicating artery aneurysm.  Postoperatively she was very slow to arouse post anesthesia.  For this reason she was left on the ventilator and transferred to neurosurgical ICU where PCCM was consulted.  Past Medical History:   has a past medical history of Anxiety, Depression, Diabetes mellitus, Hyperlipidemia, Hypertension, and Schizophrenia ([place]).   Significant Hospital Events:  2/23 presented for double vision and headache, found to have P-comm aneurysm 2/24 2 OR for clipping  Consults:  Neurosurgery Urology  Procedures:  2/24 ET tube  Significant Diagnostic Tests:  CT head 2/23> 2.6 x 3.2 cm extra-axial mass in the far  right lateral posterior fossa consistent with meningioma.  Old small vessel infarctions of both thalami. CT angio head, neck 2/23> 3 x 3 left P-comm origin aneurysm.  No intracranial arterial occlusion or high-grade stenosis. MRI brain 2/23> no acute intracranial abnormality, right posterior fossa meningioma. CT angiogram head 2/24 > status post left pterional craniotomy for clipping of left posterior communicating artery aneurysm.  No evidence of continued aneurysm opacification.  Micro Data:  None   Antimicrobials:  None  Interim History / Subjective:  She is doing well. Complain of headache on the left side where she has the staples. She asks when she'll be ready to eat.   Objective   Blood pressure (!) 164/64, pulse 74, temperature 97.9 F (36.6 C), temperature source Axillary, resp. rate (!) 23, height 5' 2.99" (1.6 m), weight 70.3 kg, SpO2 100 %.    Vent Mode: BIPAP;PCV FiO2 (%):  [30 %-60 %] 30 % Set Rate:  [10 bmp] 10 bmp PEEP:  [5 cmH20-10 cmH20] 5 cmH20   Intake/Output Summary (Last 24 hours) at 12/21/2020 0656 Last data filed at 12/21/2020 0600 Gross per 24 hour  Intake 2061.22 ml  Output 1700 ml  Net 361.22 ml   Filed Weights   12/17/20 0225 12/17/20 0400 12/18/20 0500  Weight: 67.1 kg 69.3 kg 70.3 kg    Examination: Const: In no apparent distress, lying comfortably in bed, conversational HEENT: post-op staples, left proptosis, Pupils round and reactive to light  Resp: CTA BL, no wheezes, crackles, rhonchi CV: RRR, no murmurs, gallop, rub Abd: Bowel sounds present, nondistended, nontender to palpation Ext: No lower extremity edema, skin is warm to touch Neuro: Alert, motor strength intact at all extremities, no agitation Skin: Warm  Resolved Hospital Problem list     Assessment & Plan:   Status post elective clipping of left unruptured PCOM aneurysm.  Remains critically ill due to acute hypoxic respiratory insufficiency, remains high risk for intubation due  to airway loss. Macroglossia secondary to orotracheal intubation. Vagally mediated bradycardia related to laryngeal stimulation - now resolved following extubation.  DM Difficult Foley placement: Foley catheter was placed by urology in the OR. HTN Schizophrenia   Plan: -Remains Npo due to moderate aspiration risk -Follow up SLP evaluation -Encourage out of bed to chair -Discontinue keppra -Place Core track and start tube feeding  -Transfer to step down  -Will leave Foley catheter in place until patient becomes more ambulatory    Daily Goals Checklist  Pain/Anxiety/Delirium protocol (if indicated): off all sedation Neuro vitals: every 4 hours AED's: Keppra VAP protocol (if indicated): Now extubate Respiratory support goals: Wean oxygen as tolerated, suction secretions Blood pressure target: SBP<160 DVT prophylaxis: heparin tid Nutrition Status:  SLP once extubated and respiratory status improves.  Hold enteral medication for now.  Core track tube tomorrow if swallowing remains unsafe. GI prophylaxis: Not indicated Fluid status goals: allow autoregulation Urinary catheter: keep current catheter for now as difficult placement Central lines: PIV, Arterial line Glucose control: euglycemic Mobility/therapy needs: PT/OT once extubated.  Antibiotic de-escalation: none Home medication reconciliation: continue home psychiatric medications.  Daily labs: CBC, BMP qMTh Code Status: Full Family Communication: attempted to update patient's sister. Disposition: ICU until respiratory status improves.    Goals of Care:  Last date of multidisciplinary goals of care discussion: per primary Family and staff present:  Summary of discussion:  Follow up goals of care discussion due: 3/3 Code Status: FULL  Labs   CBC: Recent Labs  Lab 12/14/20 1835 12/14/20 1902 12/14/20 2308 12/15/20 1522 12/15/20 1901 12/15/20 2105 12/16/20 0414 12/20/20 0256 12/20/20 1409  WBC 8.2  --  6.6   --   --   --  15.3* 13.0*  --   NEUTROABS 4.9  --  4.1  --   --   --   --   --   --   HGB 12.4   < > 11.5*   < > 13.9 13.6 10.8* 10.7* 11.2*  HCT 39.7   < > 37.9   < > 41.0 40.0 33.4* 33.5* 33.0*  MCV 78.0*  --  78.6*  --   --   --  76.1* 76.1*  --   PLT 278  --  233  --   --   --  235 153  --    < > = values in this interval not displayed.    Basic Metabolic Panel: Recent Labs  Lab 12/14/20 1835 12/14/20 1902 12/15/20 1522 12/15/20 2105 12/16/20 0414 12/16/20 1622 12/17/20 0242 12/17/20 1655 12/18/20 0508 12/20/20 0256 12/20/20 1409  NA 135 138   < > 142 140  --  139  --   --  140 138  K 3.9 3.8   < > 3.6 3.2*  --  4.1  --   --  3.4* 4.0  CL 101 98  --   --  110  --  111  --   --  102  --   CO2 27  --   --   --  21*  --  21*  --   --  28  --   GLUCOSE 101* 96  --   --  103*  --  113*  --   --  116*  --   BUN 10 12  --   --  11  --  19  --   --  5*  --   CREATININE 0.90 0.90  --   --  0.93  --  0.81  --   --  0.45  --   CALCIUM 9.2  --   --   --  8.3*  --  8.4*  --   --  8.9  --   MG  --   --   --   --   --  1.8 2.0 2.0 2.0  --   --   PHOS  --   --   --   --   --  4.3 3.8 3.0 3.0  --   --    < > = values in this interval not displayed.   GFR: Estimated Creatinine Clearance: 63.3 mL/min (by C-G formula based on SCr of 0.45 mg/dL). Recent Labs  Lab 12/14/20 1835 12/14/20 2308 12/16/20 0414 12/20/20 0256  WBC 8.2 6.6 15.3* 13.0*    Liver Function Tests: Recent Labs  Lab 12/14/20 1835  AST 14*  ALT 10  ALKPHOS 92  BILITOT 0.5  PROT 6.8  ALBUMIN 3.3*   No results for input(s): LIPASE, AMYLASE in the last 168 hours. No results for input(s): AMMONIA in the last 168 hours.  ABG    Component Value Date/Time   PHART 7.444 12/20/2020 1409   PCO2ART 40.5 12/20/2020 1409   PO2ART 68 (L) 12/20/2020 1409   HCO3 27.8 12/20/2020 1409   TCO2 29 12/20/2020 1409   ACIDBASEDEF 1.0 12/15/2020 1901   O2SAT 94.0 12/20/2020 1409     Coagulation Profile: Recent Labs   Lab 12/14/20 1835  INR 1.0    Cardiac Enzymes: No results for input(s): CKTOTAL, CKMB, CKMBINDEX, TROPONINI in the last 168 hours.  HbA1C: Hgb A1c MFr Bld  Date/Time Value Ref Range Status  12/14/2020 11:08 PM 5.9 (H) 4.8 - 5.6 % Final    Comment:    (NOTE) Pre diabetes:          5.7%-6.4%  Diabetes:              >6.4%  Glycemic control for   <7.0% adults with diabetes   10/30/2012 06:10 AM 6.1 (H) <5.7 % Final    Comment:    (NOTE)                                                                       According to the ADA Clinical Practice Recommendations for 2011, when HbA1c is used as a screening test:  >=6.5%   Diagnostic of Diabetes Mellitus           (if abnormal result is confirmed) 5.7-6.4%   Increased risk of developing Diabetes Mellitus References:Diagnosis and Classification of Diabetes Mellitus,Diabetes HERD,4081,44(YJEHU 1):S62-S69 and Standards of Medical Care in         Diabetes - 2011,Diabetes DJSH,7026,37 (Suppl 1):S11-S61.    CBG: Recent Labs  Lab 12/19/20 2132 12/20/20 0615 12/20/20 1535 12/20/20 2148 12/21/20 0651  GLUCAP 128* 119* 131* 106* 87    CRITICAL CARE Performed by: Jean Rosenthal

## 2020-12-21 NOTE — Progress Notes (Signed)
Inpatient Rehab Admissions Coordinator:   At this time we are recommending CIR consult and I will place an order per our protocol.    Shann Medal, PT, DPT Admissions Coordinator [phone] 12/21/20  11:26 AM

## 2020-12-21 NOTE — Progress Notes (Signed)
Pt transferred from ICU to 4NP. VS stable. Pt resting comfortably, Bed in lowest position and call bell in reach.   Belongings at bedside:  Upper denture [place]

## 2020-12-21 NOTE — Progress Notes (Addendum)
  NEUROSURGERY PROGRESS NOTE   Respiratory distress yesterday afternoon requiring bipap likely secondary to tongue edema. Doing well this am on nasal canula.  Patient is much more awake/alert today. She does not recall anything thus far since arrival to hospital. She denies pain today.  EXAM:  BP (!) 135/58   Pulse 69   Temp 98.4 F (36.9 C) (Oral)   Resp (!) 32   Ht 5' 2.99" (1.6 m) Comment: previous admit  Wt 70.3 kg   SpO2 100%   BMI 27.46 kg/m   Awake, alert, oriented x4 Speech difficulties due to tongue swelling, but appropriately answering questions. CN grossly intact except left CN3 palsy 5/5 BUE/BLE  Incision: c/d/i  IMPRESSION/PLAN 70 y.o. female POD# 6 s/p clipping LPcom aneurysm. Improving neurologically. - continue supportive care - appreciate CCM assistance with management of patient   I have seen and examined Shannon [name] and agree with the exam, impression, and plan as documented in the note by Ferne Reus, PA-C.  POD#6 s/p clipping L Pcom aneurysm, at baseline with left CN III palsy. Tongue swelling appears improved.   - Recheck swallow eval today hopefully for PO diet - Cont PT/OT - Can transfer to stepdown  [name], MD [facility]

## 2020-12-21 NOTE — Progress Notes (Signed)
Pt removed from BIPAP and placed on 3L [place]. WOB WNL. RN made aware. RT to continue to monitor.

## 2020-12-21 NOTE — Progress Notes (Signed)
  Speech Language Pathology Treatment: Dysphagia;Cognitive-Linquistic  Patient Details Name: Shannon [name] MRN: [id number] DOB: [birth year] Today's Date: 12/21/2020 Time: 1030-1100 SLP Time Calculation (min) (ACUTE ONLY): 30 min  Assessment / Plan / Recommendation Clinical Impression  Pt was seen at bedside for assessment of PO readiness and cognitive-linguistic treatment focused on goals for PO readiness, attention, problem solving, and awareness. Pt was seated in recliner, awake and cooperative. RN reports improvement since yesterday, with pt requesting something to eat/drink. Pt continues to demonstrate adequate orofacial strength and movement, however, her tongue continues to protrude past lips at rest. Pt speech is minimally dysarthric today. Volitional cough is weak and nonproductive. Oral care was completed with suction, with pt assist. Following oral care, upper denture was placed. Pt indicated it did not fit well without adhesive. Due to dentures being ill-fitting, upper denture was removed and placed in denture cup with cleanser tablet, and left next to sink in room. Pt accepted ice chips, thin liquid, nectar thick liquids, puree, and solid textures. Pt was noted to be impulsive with self feeding - unsure if this is due to cognitive impairment or hunger, however, this impulsivity increases risk of aspiration. No overt s/s aspiration observed on any consistency, with success on the 3oz water challenge. Will begin dys 2 diet with thin liquids, meds whole with liquid or puree (per pt preference). Also recommend 1:1 supervision with all PO intake due to impulsivity. Safe swallow precautions posted at [facility] and reviewed with RN.  Pt continues to make improvement with cognitive linguistic goals - speech was much more intelligible today, with improved lingual ROM noted. Pt was able to attend to PO tasks, likely at least in part to being hungry. Pt demonstrated good problem solving to soften graham cracker  by dipping it in applesauce. She continues to require assistance with increasing awareness of deficits and functional impact. SLP will continue to follow acutely for assessment of diet tolerance and cognitive linguistic treatment to maximize independence and safety and decrease caregiver burden.    HPI HPI: 70 year old female with PMH: diabetes, hyperlipidemia, hypertension, and schizophrenia. Pt found to have 3rd cranial nerve palsy 2/23 after complaints of double vision and directed pt to ED. CT angiogram was done revealing a left PC aneurysm and a right posterior fossa meningioma. On 2/24 underwent left pterional craniotomy and clipping of the posterior communicating artery aneurysm. Intubated 2/24-2/28.      SLP Plan  Continue with current plan of care       Recommendations  Diet recommendations: Dysphagia 2 (fine chop);Thin liquid Liquids provided via: Cup;Straw Medication Administration: Whole meds with liquid Supervision: Patient able to self feed;Full supervision/cueing for compensatory strategies Compensations: Minimize environmental distractions;Slow rate;Small sips/bites Postural Changes and/or Swallow Maneuvers: Seated upright 90 degrees;Upright 30-60 min after meal                Oral Care Recommendations: Oral care QID Follow up Recommendations: Inpatient Rehab SLP Visit Diagnosis: Dysphagia, unspecified (R13.10);Cognitive communication deficit (R41.841);Dysarthria and anarthria (R47.1) Plan: Continue with current plan of care       [place]. Shannon [name], [facility], [place] Speech Language Pathologist Office: [phone] Pager: [phone]  Shannon [name] 12/21/2020, 11:05 AM

## 2020-12-21 NOTE — Progress Notes (Signed)
Occupational Therapy Treatment Patient Details Name: Shannon [name] MRN: [id number] DOB: 1951/07/01 Today's Date: 12/21/2020    History of present illness Patient is a 70 y/o female who presents with double vision, found to have a left P-comm aneurysm and a right posterior fossa meningioma. s/p left pterional craniotomy for clipping of posterior communicating artery aneurysm on 2/24. Extubated 12/19/20. PMH includes schizophrenia, HTN, HLD, depression, anxiety.   OT comments  Pt with improvement basic transfers this session compared to last session. Pt with improved RR29 this session on [place] 4L but with decreased SOB compared to last session. Pt able to hold conversation with stronger voice tone. Pt remains high fall risk and decreased awareness to deficits. Recommendation CIR at this time.   Follow Up Recommendations  CIR    Equipment Recommendations  3 in 1 bedside commode    Recommendations for Other Services Rehab consult    Precautions / Restrictions Precautions Precautions: Fall;Other (comment) Precaution Comments: watch RR, restless SPP <160 Restrictions Weight Bearing Restrictions: (P) No       Mobility Bed Mobility Overal bed mobility: Needs Assistance Bed Mobility: Rolling;Supine to Sit Rolling: Min guard Sidelying to sit: Mod assist       General bed mobility comments: pt requires (A) due to posterior LOB and to sequence task due to uncontrolled LOB with attempts. PT static sitting with initial LOB and progressed to min guard with OT / PT helping position    Transfers Overall transfer level: Needs assistance Equipment used: Rolling walker (2 wheeled) Transfers: Sit to/from [employer] Transfers Sit to Stand: +2 physical assistance;Min assist Stand pivot transfers: +2 physical assistance;Min assist       General transfer comment: pt requires (A) for lines and leads. pt able to sustain hands on RW but unable to turn. pt with narrowed base of support and  cues ot widen feet    Balance Overall balance assessment: Needs assistance Sitting-balance support: Bilateral upper extremity supported;Feet supported Sitting balance-Leahy Scale: Fair     Standing balance support: Bilateral upper extremity supported;During functional activity Standing balance-Leahy Scale: Poor Standing balance comment: reliant on external supports                           ADL either performed or assessed with clinical judgement   ADL Overall ADL's : Needs assistance/impaired Eating/Feeding: NPO   Grooming: Minimal assistance;Sitting               Lower Body Dressing: Maximal assistance;Sit to/from stand Lower Body Dressing Details (indicate cue type and reason): pt able to figure 4 cross with OT placing R LE over L to don sock. pt total (A) with L sock. pt unable to complete supine and requires cues for safety Toilet Transfer: +2 for physical assistance;Minimal assistance;RW;BSC Toilet Transfer Details (indicate cue type and reason): simulated transfer bed to chair. pt with uncontrolled descend to chair         Functional mobility during ADLs: +2 for physical assistance;Minimal assistance;Rolling walker General ADL Comments: pt motivated to eat this session. pt very fixated on getting food. Pt set up in the chair for SLP to follow therapy to do food trials     Vision   Additional Comments: L eye partial opening this session but unable to fully open. pt with decreased vision in L eyes states "blurred"   Perception     Praxis      Cognition Arousal/Alertness: Awake/alert Behavior During Therapy: Restless Overall  Cognitive Status: Impaired/Different from baseline Area of Impairment: Attention                   Current Attention Level: Selective   Following Commands: Follows one step commands consistently Safety/Judgement: Decreased awareness of safety Awareness: Emergent Problem Solving: Requires verbal cues General  Comments: oriented x4 and even able to describe surgery. Pt very much expressed wanting food and not eating in 4 days pt very restless and needs cues for safety. pt with posey and chair alarm for safety        Exercises     Shoulder Instructions       General Comments BP sitting 156/78 (101) and after movement 170/62(95) RR 29 HR sustain 70s during session 4L [place]    Pertinent Vitals/ Pain       Pain Assessment: No/denies pain  Home Living                                          Prior Functioning/Environment              Frequency  Min 2X/week        Progress Toward Goals  OT Goals(current goals can now be found in the care plan section)  Progress towards OT goals: Progressing toward goals  Acute Rehab OT Goals Patient Stated Goal: eat something OT Goal Formulation: With patient Time For Goal Achievement: 01/03/21 Potential to Achieve Goals: Good ADL Goals Pt Will Perform Grooming: with min assist;sitting Pt Will Perform Upper Body Bathing: with min assist;sitting Pt Will Transfer to Toilet: with mod assist;bedside commode;stand pivot transfer Additional ADL Goal #1: pt will complete bed mobility supervision level as precursor to adls  Plan Discharge plan remains appropriate    Co-evaluation    PT/OT/SLP Co-Evaluation/Treatment: Yes Reason for Co-Treatment: For patient/therapist safety;To address functional/ADL transfers   OT goals addressed during session: ADL's and self-care;Proper use of Adaptive equipment and DME;Strengthening/ROM      AM-PAC OT "6 Clicks" Daily Activity     Outcome Measure   Help from another person eating meals?: A Little Help from another person taking care of personal grooming?: A Lot Help from another person toileting, which includes using toliet, bedpan, or urinal?: A Little Help from another person bathing (including washing, rinsing, drying)?: A Little Help from another person to put on and taking off  regular upper body clothing?: A Lot Help from another person to put on and taking off regular lower body clothing?: A Lot 6 Click Score: 15    End of Session Equipment Utilized During Treatment: Oxygen;Gait belt;Rolling walker  OT Visit Diagnosis: Unsteadiness on feet (R26.81);Muscle weakness (generalized) (M62.81)   Activity Tolerance Patient tolerated treatment well   Patient Left in chair;with call bell/phone within reach;with chair alarm set;Other (comment) (posey)   Nurse Communication Mobility status;Precautions        Time: [phone] OT Time Calculation (min): 24 min  Charges: OT General Charges $OT Visit: 1 Visit OT Treatments $Self Care/Home Management : 8-22 mins   [place], OTR/L  Acute Rehabilitation Services Pager: [phone] Office: [phone] .    Jeri Modena 12/21/2020, 10:49 AM

## 2020-12-21 NOTE — Consult Note (Signed)
Physical Medicine and Rehabilitation Consult   Reason for Consult: Functional deficits s/p aneurysm clipping.  Referring Physician: Dr. Kathyrn Sheriff.    HPI: Shannon [name] is a 70 y.o. female with history of HTN, T2DM, schizophrenia who was admitted from ophthalmology office on 02/23/22with Left CN III palsy and reports of diplopia X 5 days. She was found to have 3 X 3 mm Left P-comm origin aneurysm and underwent left pterional craniotomy for clipping of aneurysm by Dr. Kathyrn Sheriff. Post op course significant for obtundation felt to be due to significant bifrontal pneumocephalus. On 02/25 was noted to be responding with sluggish but reactive left pupil and had episodes of bradycardia felt to be vagal in nature. She tolerated extubation by 02/28 and on Cleviprex for BP management.    She did develop respiratory distress requiring BIPAP yesterday due to tongue edema and weaned to [place] today. Therapy evaluations completed revealing hypoxia with tachypnea, bradycardia, weakness with L-knee instability, dysarthria with mild cognitive deficits as well as impulsivity affecting mobility and ADLs.  CIR recommended due to functional deficits. She prefers to discharge home but has significant functional impairment.    Review of Systems  Constitutional: Negative for chills and fever.  HENT: Negative for ear pain and tinnitus.   Eyes: Negative for pain.       Lack of vision in left eye  Respiratory: Positive for shortness of breath.   Cardiovascular: Negative for chest pain and palpitations.  Gastrointestinal: Negative for heartburn and nausea.  Genitourinary: Negative for dysuria and urgency.  Musculoskeletal: Negative for back pain and myalgias.  Skin: Negative for rash.  Neurological: Positive for dizziness and weakness. Negative for headaches.  Psychiatric/Behavioral: The patient does not have insomnia.      Past Medical History:  Diagnosis Date  . Anxiety   . Depression   . Diabetes mellitus    . Hyperlipidemia   . Hypertension   . Schizophrenia [facility])     Past Surgical History:  Procedure Laterality Date  . BREAST SURGERY    . CRANIOTOMY Left 12/15/2020   Procedure: LEFT CRANIOTOMY FOR INTRACRANIAL ANEURYSM;  Surgeon: [name], MD;  Location: [place];  Service: Neurosurgery;  Laterality: Left;  . EXPLORATORY LAPAROTOMY    . INSERTION OF SUPRAPUBIC CATHETER  12/15/2020   Procedure: INSERTION OF FOLEY CATHETER;  Surgeon: Remi Haggard, MD;  Location: [facility] OR;  Service: Urology;;    Family History  Problem Relation Age of Onset  . Kidney disease Mother     Social History:  Lives with a friend. Retired--used to clean houses. She  reports that she has been smoking cigarettes--8 per day. She has a 40.00 pack-year smoking history. She has never used smokeless tobacco. She denies alcohol use. She reports that she does not use drugs.   Allergies: No Known Allergies    Medications Prior to Admission  Medication Sig Dispense Refill  . acetaminophen (TYLENOL) 500 MG tablet Take 1,000 mg by mouth every 6 (six) hours as needed for mild pain.    Marland Kitchen albuterol (VENTOLIN HFA) 108 (90 Base) MCG/ACT inhaler Inhale 2 puffs into the lungs every 6 (six) hours as needed for shortness of breath or wheezing.    Marland Kitchen amLODipine (NORVASC) 5 MG tablet Take 1 tablet (5 mg total) by mouth daily. For hypertension. (Patient taking differently: Take 5 mg by mouth daily. For hypertension. Had not filled recently until 12-13-20) 30 tablet 0  . lithium carbonate (LITHOBID) 300 MG CR tablet Take 300  mg by mouth 2 (two) times daily.    Marland Kitchen OLANZapine (ZYPREXA) 20 MG tablet Take 20 mg by mouth at bedtime.      Home: Home Living Family/patient expects to be discharged to:: Private residence Living Arrangements: Non-relatives/Friends Available Help at Discharge: Friend(s),Available PRN/intermittently Type of Home: Apartment Home Access: Level entry [place]: One level Biochemist, clinical:  [place] - single point  Lives With:  ("friend")  Functional History: Prior Function Level of Independence: Independent Comments: On disability. Does not drive. Functional Status:  Mobility: Bed Mobility Overal bed mobility: Needs Assistance Bed Mobility: Rolling,Supine to Sit Rolling: Min guard Sidelying to sit: Mod assist General bed mobility comments: pt requires (A) due to posterior LOB and to sequence task due to uncontrolled LOB with attempts. PT static sitting with initial LOB and progressed to min guard with OT / PT helping position Transfers Overall transfer level: Needs assistance Equipment used: Rolling walker (2 wheeled) Transfers: Sit to/from [employer] Sit to Stand: +2 physical assistance,Min assist Stand pivot transfers: +2 physical assistance,Min assist General transfer comment: pt requires (A) for lines and leads. pt able to sustain hands on RW but unable to turn. pt with narrowed base of support and cues ot widen feet Ambulation/Gait General Gait Details: Deferred due to respiratory status, increased RR.    ADL: ADL Overall ADL's : Needs assistance/impaired Eating/Feeding: NPO Grooming: Minimal assistance,Sitting Upper Body Bathing: Cueing for safety Lower Body Bathing: With adaptive equipment Lower Body Dressing: Maximal assistance,Sit to/from stand Lower Body Dressing Details (indicate cue type and reason): pt able to figure 4 cross with OT placing R LE over L to don sock. pt total (A) with L sock. pt unable to complete supine and requires cues for safety Toilet Transfer: +2 for physical assistance,Minimal assistance,RW,BSC Toilet Transfer Details (indicate cue type and reason): simulated transfer bed to chair. pt with uncontrolled descend to chair Functional mobility during ADLs: +2 for physical assistance,Minimal assistance,Rolling walker General ADL Comments: pt motivated to eat this session. pt very fixated on getting  food. Pt set up in the chair for SLP to follow therapy to do food trials  Cognition: Cognition Overall Cognitive Status: Impaired/Different from baseline Arousal/Alertness: Awake/alert Orientation Level: Oriented X4 Attention: Sustained Sustained Attention: Impaired Sustained Attention Impairment: Verbal basic,Functional basic Memory:  (TBA) Awareness: Impaired Awareness Impairment: Emergent impairment,Anticipatory impairment Problem Solving: Impaired Problem Solving Impairment: Verbal basic,Functional basic Behaviors: Restless,Impulsive Safety/Judgment: Impaired Cognition Arousal/Alertness: Awake/alert Behavior During Therapy: Restless Overall Cognitive Status: Impaired/Different from baseline Area of Impairment: Attention Current Attention Level: Selective Following Commands: Follows one step commands consistently Safety/Judgement: Decreased awareness of safety Awareness: Emergent Problem Solving: Requires verbal cues General Comments: oriented x4 and even able to describe surgery. Pt very much expressed wanting food and not eating in 4 days pt very restless and needs cues for safety. pt with posey and chair alarm for safety    Blood pressure (!) 179/74, pulse 73, temperature 98.4 F (36.9 C), temperature source Oral, resp. rate (!) 33, height 5' 2.99" (1.6 m), weight 70.3 kg, SpO2 100 %. Physical Exam Gen: no distress, normal appearing HEENT: oral mucosa pink and moist, NCAT Cardio: Reg rate Chest: increased work of breathing Abd: soft, non-distended Ext: no edema Psych: pleasant, normal affect Skin: intact Neuro:    Mental Status: She is alert and oriented to person, place, and time.     Comments: Mild dysarthria. Able to recall date and answer biographic questions. Able to follow simple one step motor  commands.      Results for orders placed or performed during the hospital encounter of 12/14/20 (from the past 24 hour(s))  I-STAT 7, (LYTES, BLD GAS, ICA, H+H)      Status: Abnormal   Collection Time: 12/20/20  2:09 PM  Result Value Ref Range   pH, Arterial 7.444 7.350 - 7.450   pCO2 arterial 40.5 32.0 - 48.0 mmHg   pO2, Arterial 68 (L) 83.0 - 108.0 mmHg   Bicarbonate 27.8 20.0 - 28.0 mmol/L   TCO2 29 22 - 32 mmol/L   O2 Saturation 94.0 %   Acid-Base Excess 3.0 (H) 0.0 - 2.0 mmol/L   Sodium 138 135 - 145 mmol/L   Potassium 4.0 3.5 - 5.1 mmol/L   Calcium, Ion 1.23 1.15 - 1.40 mmol/L   HCT 33.0 (L) 36.0 - 46.0 %   Hemoglobin 11.2 (L) 12.0 - 15.0 g/dL   Patient temperature 98.1 F    Collection site Radial    Drawn by RT    Sample type ARTERIAL   Glucose, capillary     Status: Abnormal   Collection Time: 12/20/20  3:35 PM  Result Value Ref Range   Glucose-Capillary 131 (H) 70 - 99 mg/dL  Glucose, capillary     Status: Abnormal   Collection Time: 12/20/20  9:48 PM  Result Value Ref Range   Glucose-Capillary 106 (H) 70 - 99 mg/dL  Glucose, capillary     Status: None   Collection Time: 12/21/20  6:51 AM  Result Value Ref Range   Glucose-Capillary 87 70 - 99 mg/dL  Basic metabolic panel     Status: Abnormal   Collection Time: 12/21/20  7:07 AM  Result Value Ref Range   Sodium 138 135 - 145 mmol/L   Potassium 4.1 3.5 - 5.1 mmol/L   Chloride 103 98 - 111 mmol/L   CO2 26 22 - 32 mmol/L   Glucose, Bld 93 70 - 99 mg/dL   BUN 14 8 - 23 mg/dL   Creatinine, Ser 0.53 0.44 - 1.00 mg/dL   Calcium 8.8 (L) 8.9 - 10.3 mg/dL   GFR, Estimated >60 >60 mL/min   Anion gap 9 5 - 15   DG CHEST PORT 1 VIEW  Result Date: 12/20/2020 CLINICAL DATA:  Oxygen desaturation EXAM: PORTABLE CHEST 1 VIEW COMPARISON:  December 15, 2020 FINDINGS: The endotracheal tube seen on the prior study has been removed. Mild, diffuse, chronic appearing increased lung markings are seen. Mild areas of atelectasis and/or infiltrate are noted within the bilateral lung bases, left greater than right. This is very mildly increased in severity when compared to the prior study. A small left  pleural effusion is suspected. There is no evidence of a pneumothorax. The cardiac silhouette is mildly enlarged. There is moderate severity calcification of the aortic arch. The visualized skeletal structures are unremarkable. IMPRESSION: 1. Mild bibasilar atelectasis and/or infiltrate, left greater than right. 2. Small left pleural effusion. Electronically Signed   By: Virgina Norfolk M.D.   On: 12/20/2020 15:21     Assessment/Plan: Diagnosis: Left P-comm aneurysm s/p craniotomy 1. Does the need for close, 24 hr/day medical supervision in concert with the patient's rehab needs make it unreasonable for this patient to be served in a less intensive setting? Yes 2. Co-Morbidities requiring supervision/potential complications:  1. HTN 2. DM2 3. Schizophrenia 4. Impaired vision 5. Overweight: BMI: 27.46 3. Due to bladder management, bowel management, safety, skin/wound care, disease management, medication administration, pain management and patient education, does  the patient require 24 hr/day rehab nursing? Yes 4. Does the patient require coordinated care of a physician, rehab nurse, therapy disciplines of PT, OT, SLP to address physical and functional deficits in the context of the above medical diagnosis(es)? Yes Addressing deficits in the following areas: balance, endurance, locomotion, strength, transferring, bowel/bladder control, bathing, dressing, feeding, grooming, toileting, cognition and psychosocial support 5. Can the patient actively participate in an intensive therapy program of at least 3 hrs of therapy per day at least 5 days per week? Yes 6. The potential for patient to make measurable gains while on inpatient rehab is excellent 7. Anticipated functional outcomes upon discharge from inpatient rehab are supervision  with PT, supervision with OT, supervision with SLP. 8. Estimated rehab length of stay to reach the above functional goals is: 10-12 days 9. Anticipated discharge  destination: Home 10. Overall Rehab/Functional Prognosis: good  RECOMMENDATIONS: This patient's condition is appropriate for continued rehabilitative care in the following setting: CIR Patient has agreed to participate in recommended program. Yes Note that insurance prior authorization may be required for reimbursement for recommended care.  Comment: Thank you for this consult. Admission coordinator to follow.   I have personally performed a face to face diagnostic evaluation, including, but not limited to relevant history and physical exam findings, of this patient and developed relevant assessment and plan.  Additionally, I have reviewed and concur with the physician assistant's documentation above.  Leeroy Cha, MD Bary Leriche, PA-C 12/21/2020

## 2020-12-21 NOTE — Progress Notes (Signed)
Initial Nutrition Assessment  DOCUMENTATION CODES:   Not applicable  INTERVENTION:   Ensure Enlive po BID, each supplement provides 350 kcal and 20 grams of protein  Encourage PO intake    NUTRITION DIAGNOSIS:   Inadequate oral intake related to inability to eat as evidenced by NPO status. Progressing.   GOAL:   Patient will meet greater than or equal to 90% of their needs Progressing  MONITOR:   Diet advancement,PO intake,Supplement acceptance  REASON FOR ASSESSMENT:   Consult,Ventilator Enteral/tube feeding initiation and management  ASSESSMENT:   Pt with PMH of anxiety, depression, DM, HLD, HTN, and schizophrenia admitted with aneurysm and R posterior fossa meningioma s/p L crani for clipping.  2/24 crani for clipping 2/28 extubation  3/2 diet advanced to Dysphagia 2 with Thin liquids   Pt discussed during ICU rounds and with RN.  Pt more alert today, able to work with SLP.   Medications reviewed and include: colace, SSI, miralax, senna Labs reviewed    Diet Order:   Diet Order            DIET DYS 2 Room service appropriate? Yes; Fluid consistency: Thin  Diet effective now                 EDUCATION NEEDS:   No education needs have been identified at this time  Skin:  Skin Assessment: Skin Integrity Issues: Skin Integrity Issues:: Stage II Stage II: sacrum  Last BM:  3/1  Height:   Ht Readings from Last 1 Encounters:  12/16/20 5' 2.99" (1.6 m)    Weight:   Wt Readings from Last 1 Encounters:  12/18/20 70.3 kg    Ideal Body Weight:     BMI:  Body mass index is 27.46 kg/m.  Estimated Nutritional Needs:   Kcal:  1700-2000  Protein:  85-100 grams  Fluid:  > 1.7 L/day  Lockie Pares., RD, LDN, CNSC See AMiON for contact information

## 2020-12-22 LAB — GLUCOSE, CAPILLARY
Glucose-Capillary: 87 mg/dL (ref 70–99)
Glucose-Capillary: 81 mg/dL (ref 70–99)
Glucose-Capillary: 107 mg/dL — ABNORMAL HIGH (ref 70–99)
Glucose-Capillary: 89 mg/dL (ref 70–99)
Glucose-Capillary: 146 mg/dL — ABNORMAL HIGH (ref 70–99)

## 2020-12-22 NOTE — PMR Pre-admission (Shared)
PMR Admission Coordinator Pre-Admission Assessment  Patient: Shannon [name] is an 70 y.o., female MRN: [id number] DOB: April 13, 1951 Height: 5' 2.99" (160 cm) (previous admit) Weight: 70 kg              Insurance Information HMO: yes    PPO:      PCP:      IPA:      80/20:      OTHER:  PRIMARY: U[employer] Medicare      Policy#: [id number]      Subscriber: patient CM Name:       Phone#: [phone]     Fax#: [phone] Pre-Cert#: [id number] Received approval from Angelica  for 7 days. Updates due on 12/28/20      Employer: Disabled Benefits:  Phone #: [email or web address] [phone]     Name:  Eff. Date: 10/22/20-still active     Deduct: $0 Out of Pocket Max: $7,500 ($0 met)      Life Max: NA  CIR: $1,480 admission co-pay      SNF: $0 for days 1-20; $194.50 co-pay for days 21-100; maximum of 100 days/benefit period Outpatient: 100%     Co-Pay:  Home Health: 100%; limited by medical necessity      Co-Pay:  DME: 80%     Co-Pay: 20% Providers: in network  SECONDARY: Medicaid of [place]      Policy#: [id number]      Phone#: [phone]  Financial Counselor:       Phone#:   The "Data Collection Information Summary" for patients in Inpatient Rehabilitation Facilities with attached "Privacy Act [place] Records" was provided and verbally reviewed with: {CHL IP Patient Family PI:[id number]}  Emergency Contact Information Contact Information    Name Relation Home Work Mobile   [place] Sister [phone]       Current Medical History  Patient Admitting Diagnosis: L P-comm aneurysm with craniotomy  History of Present Illness: A 70 y.o. female with history of HTN, T2DM, schizophrenia who was admitted from ophthalmology office on 02/23/22with Left CN III palsy and reports of diplopia X 5 days. She was found to have 3 X 3 mm Left P-comm origin aneurysm and underwent left pterional craniotomy for clipping of aneurysm by Dr. Kathyrn Sheriff. Post op course significant for obtundation felt to  be due to significant bifrontal pneumocephalus. On 02/25 was noted to be responding with sluggish but reactive left pupil and had episodes of bradycardia felt to be vagal in nature. She tolerated extubation by 02/28 and on Cleviprex for BP management.    She did develop respiratory distress requiring BIPAP yesterday due to tongue edema and weaned to [place] today. Therapy evaluations completed revealing hypoxia with tachypnea, bradycardia, weakness with L-knee instability, dysarthria with mild cognitive deficits as well as impulsivity affecting mobility and ADLs.  CIR recommended due to functional deficits. She prefers to discharge home but has significant functional impairment.   Complete NIHSS TOTAL: 1 Glasgow Coma Scale Score: 15  Past Medical History  Past Medical History:  Diagnosis Date  . Anxiety   . Depression   . Diabetes mellitus   . Hyperlipidemia   . Hypertension   . Schizophrenia ([place])     Family History  family history includes Kidney disease in her mother.  Prior Rehab/Hospitalizations: No previous rehab admissions  Has the patient had prior rehab or hospitalizations prior to admission? No  Has the patient had major surgery during 100 days prior to admission? Yes  Current Medications   Current Facility-Administered Medications:  .  0.9 %  sodium chloride infusion, , Intravenous, Continuous, Agarwala, Ravi, MD, Last Rate: 75 mL/hr at 12/22/20 0844, New Bag at 12/22/20 0844 .  acetaminophen (TYLENOL) tablet 650 mg, 650 mg, Per Tube, Q4H PRN, 650 mg at 12/18/20 0505 **OR** acetaminophen (TYLENOL) suppository 650 mg, 650 mg, Rectal, Q4H PRN, Ashok Pall, MD .  amLODipine (NORVASC) tablet 10 mg, 10 mg, Oral, Daily, Ashok Pall, MD, 10 mg at 12/22/20 0842 .  atorvastatin (LIPITOR) tablet 20 mg, 20 mg, Oral, Daily, Ashok Pall, MD, 20 mg at 12/22/20 0842 .  bisacodyl (DULCOLAX) EC tablet 5 mg, 5 mg, Oral, Daily PRN, Costella, Vincent J, PA-C .  chlorhexidine (PERIDEX) 0.12  % solution 15 mL, 15 mL, Mouth Rinse, BID, Agarwala, Ravi, MD, 15 mL at 12/22/20 0842 .  Chlorhexidine Gluconate Cloth 2 % PADS 6 each, 6 each, Topical, Daily, Ashok Pall, MD, 6 each at 12/22/20 [phone]  docusate sodium (COLACE) capsule 100 mg, 100 mg, Oral, BID, Ashok Pall, MD, 100 mg at 12/21/20 2239 .  feeding supplement (ENSURE ENLIVE / ENSURE PLUS) liquid 237 mL, 237 mL, Oral, BID BM, Agyei, Obed K, MD, 237 mL at 12/22/20 0843 .  fentaNYL (SUBLIMAZE) injection 25 mcg, 25 mcg, Intravenous, Q15 min PRN, Corey Harold, NP, 25 mcg at 12/17/20 0427 .  haloperidol lactate (HALDOL) injection 2 mg, 2 mg, Intravenous, Q6H PRN, Agarwala, Ravi, MD, 2 mg at 12/20/20 1139 .  heparin injection 5,000 Units, 5,000 Units, Subcutaneous, Q8H, Agarwala, Einar Grad, MD, 5,000 Units at 12/22/20 0503 .  hydrALAZINE (APRESOLINE) injection 10 mg, 10 mg, Intravenous, Q4H PRN, Ashok Pall, MD, 10 mg at 12/21/20 1128 .  insulin aspart (novoLOG) injection 0-9 Units, 0-9 Units, Subcutaneous, Q8H, Agarwala, Ravi, MD, 1 Units at 12/20/20 1537 .  lithium carbonate (LITHOBID) CR tablet 300 mg, 300 mg, Oral, BID, Agyei, Obed K, MD, 300 mg at 12/22/20 0842 .  magnesium citrate solution 1 Bottle, 1 Bottle, Oral, Once PRN, Ashok Pall, MD .  MEDLINE mouth rinse, 15 mL, Mouth Rinse, q12n4p, Agarwala, Ravi, MD, 15 mL at 12/21/20 1131 .  naloxone (NARCAN) injection 0.08 mg, 0.08 mg, Intravenous, PRN, Costella, Vincent J, PA-C .  OLANZapine (ZYPREXA) tablet 20 mg, 20 mg, Oral, QHS, Ashok Pall, MD, 20 mg at 12/21/20 2240 .  ondansetron (ZOFRAN) tablet 4 mg, 4 mg, Per Tube, Q4H PRN **OR** ondansetron (ZOFRAN) injection 4 mg, 4 mg, Intravenous, Q4H PRN, Blenda Nicely, RPH .  polyethylene glycol (MIRALAX / GLYCOLAX) packet 17 g, 17 g, Oral, Daily PRN, Costella, Vincent J, PA-C .  polyethylene glycol (MIRALAX / GLYCOLAX) packet 17 g, 17 g, Oral, Daily, Cabbell, Kyle, MD .  promethazine (PHENERGAN) tablet 12.5-25 mg, 12.5-25 mg,  Per Tube, Q4H PRN, Blenda Nicely, RPH .  senna (SENOKOT) tablet 8.6 mg, 1 tablet, Oral, BID, Ashok Pall, MD, 8.6 mg at 12/21/20 2240 .  senna-docusate (Senokot-S) tablet 1 tablet, 1 tablet, Per Tube, QHS PRN, Blenda Nicely, RPH .  sodium phosphate (FLEET) 7-19 GM/118ML enema 1 enema, 1 enema, Rectal, Once PRN, Costella, Vista Mink, PA-C  Patients Current Diet:  Diet Order            DIET DYS 2 Room service appropriate? Yes; Fluid consistency: Thin  Diet effective now                 Precautions / Restrictions Precautions Precautions: Fall,Other (comment) Precaution Comments: watch RR, restless SPP <160 Restrictions Weight Bearing Restrictions: No   Has the patient had 2  or more falls or a fall with injury in the past year?No  Prior Activity Level Limited Community (1-2x/wk): Went out of apartment to her mailbox daily; goes out to store every 2 weeks.  Prior Functional Level Prior Function Level of Independence: Independent Comments: On disability. Does not drive.  Self Care: Did the patient need help bathing, dressing, using the toilet or eating?  Independent  Indoor Mobility: Did the patient need assistance with walking from room to room (with or without device)? Independent  Stairs: Did the patient need assistance with internal or external stairs (with or without device)? Independent  Functional Cognition: Did the patient need help planning regular tasks such as shopping or remembering to take medications? Independent  Home Assistive Devices / Equipment Home Assistive Devices/Equipment: Cane (specify quad or straight) Home Equipment: Cane - single point  Prior Device Use: Indicate devices/aids used by the patient prior to current illness, exacerbation or injury? Cane  Current Functional Level Cognition  Arousal/Alertness: Awake/alert Overall Cognitive Status: Impaired/Different from baseline Current Attention Level: Selective Orientation Level: Oriented  X4 Following Commands: Follows one step commands consistently Safety/Judgement: Decreased awareness of safety General Comments: oriented x4 and even able to describe surgery. Pt very much expressed wanting food and not eating in 4 days, pt very restless and needs cues for safety. pt with posey and chair alarm for safety Attention: Sustained Sustained Attention: Impaired Sustained Attention Impairment: Verbal basic,Functional basic Memory:  (TBA) Awareness: Impaired Awareness Impairment: Emergent impairment,Anticipatory impairment Problem Solving: Impaired Problem Solving Impairment: Verbal basic,Functional basic Behaviors: Restless,Impulsive Safety/Judgment: Impaired    Extremity Assessment (includes Sensation/Coordination)  Upper Extremity Assessment: RUE deficits/detail RUE Deficits / Details: decreased coordination and undershooting  Lower Extremity Assessment: Defer to PT evaluation    ADLs  Overall ADL's : Needs assistance/impaired Eating/Feeding: NPO Grooming: Minimal assistance,Sitting Upper Body Bathing: Cueing for safety Lower Body Bathing: With adaptive equipment Lower Body Dressing: Maximal assistance,Sit to/from stand Lower Body Dressing Details (indicate cue type and reason): pt able to figure 4 cross with OT placing R LE over L to don sock. pt total (A) with L sock. pt unable to complete supine and requires cues for safety Toilet Transfer: +2 for physical assistance,Minimal assistance,RW,BSC Toilet Transfer Details (indicate cue type and reason): simulated transfer bed to chair. pt with uncontrolled descend to chair Functional mobility during ADLs: +2 for physical assistance,Minimal assistance,Rolling walker General ADL Comments: pt motivated to eat this session. pt very fixated on getting food. Pt set up in the chair for SLP to follow therapy to do food trials    Mobility  Overal bed mobility: Needs Assistance Bed Mobility: Rolling,Supine to Sit Rolling: Min  guard Sidelying to sit: Mod assist Supine to sit: Min assist,+2 for safety/equipment General bed mobility comments: Min assist +2 for trunk elevation, LEs to EOB, cues for step-by-step sequencing to EOB.    Transfers  Overall transfer level: Needs assistance Equipment used: Rolling walker (2 wheeled) Transfers: Sit to/from Stand Sit to Stand: +2 physical assistance,Min assist Stand pivot transfers: +2 physical assistance,Min assist General transfer comment: min +2 for power up, rise, steady. Verbal cuing for hand placement when rising/sitting.    Ambulation / Gait / Stairs / Wheelchair Mobility  Ambulation/Gait Ambulation/Gait assistance: Min assist,+2 physical assistance,+2 safety/equipment Gait Distance (Feet): 10 Feet Assistive device: Rolling walker (2 wheeled) Gait Pattern/deviations: Step-through pattern,Decreased stride length,Staggering left,Narrow base of support,Trunk flexed General Gait Details: Min +2 for steadying, placement in RW, managing lines/leads. Verbal cuing for widening BOS, upright posture,  centering self in RW as pt with tendency to stand in L side of RW. Gait velocity: decr    Posture / Balance Dynamic Sitting Balance Sitting balance - Comments: able to sit EOB without support if not challenged, heavy anterior leaning intermittently requiring correction from PT/OT Balance Overall balance assessment: Needs assistance Sitting-balance support: Bilateral upper extremity supported,Feet supported Sitting balance-Leahy Scale: Fair Sitting balance - Comments: able to sit EOB without support if not challenged, heavy anterior leaning intermittently requiring correction from PT/OT Standing balance support: Bilateral upper extremity supported,During functional activity Standing balance-Leahy Scale: Poor Standing balance comment: reliant on external support    Special needs/care consideration {Special Care Needs/Care Considerations:[id number]}     Previous Home  Environment (from acute therapy documentation) Living Arrangements: Non-relatives/Friends  Lives With:  ("friend") Available Help at Discharge: Friend(s),Available PRN/intermittently Type of Home: Apartment Home Layout: One level Home Access: Level entry Biochemist, clinical: [place]: No  Discharge Living Setting Plans for Discharge Living Setting: Lives with (comment),Apartment (Lives with roommate, Freddie) Type of Home at Discharge: Apartment Discharge Home Layout: One level Discharge Home Access: Level entry Discharge Bathroom Shower/Tub: Tub/shower unit,Curtain Discharge Bathroom Toilet: Standard Discharge Bathroom Accessibility: Yes How Accessible: Accessible via walker Does the patient have any problems obtaining your medications?: No  Social/Family/Support Systems Patient Roles: Other (Comment) (Has 2 sisters and a roommate) Contact Information: Varney Biles - sister - [phone] Anticipated Caregiver: Annalee Genta - roommate (female) - [phone] Ability/Limitations of Caregiver: Meryle Ready can provide supervision and some light physical help; roommate is more than 32 yrs old. Caregiver Availability: 24/7 Discharge Plan Discussed with Primary Caregiver: Yes Is Caregiver In Agreement with Plan?: Yes Does Caregiver/Family have Issues with Lodging/Transportation while Pt is in Rehab?: Yes (Does not drive; sister does the driving.)   Goals Patient/Family Goal for Rehab: PT/OT/SLP Supervision goals Expected length of stay: 10-12 days but patient wants a shorter LOS Cultural Considerations: None Pt/Family Agrees to Admission and willing to participate: Yes Program Orientation Provided & Reviewed with Pt/Caregiver Including Roles  & Responsibilities: Yes  Decrease burden of Care through IP rehab admission: {Inpatient Rehab Care:20780}  Possible need for SNF placement upon discharge: Not anticipated  Patient Condition: {PATIENT'S  CONDITION:22832}  Preadmission Screen Completed By:  Retta Diones, RN, 12/22/2020 2:30 PM ______________________________________________________________________   Discussed status with Dr. Marland Kitchenon***at *** and received approval for admission today.  Admission Coordinator:  Retta Diones, time***/Date***

## 2020-12-22 NOTE — Progress Notes (Signed)
Physical Therapy Treatment Patient Details Name: Shannon [name] MRN: [id number] DOB: [birth year] Today's Date: 12/22/2020    History of Present Illness Patient is a 70 y/o female who presents with double vision, found to have a left P-comm aneurysm and a right posterior fossa meningioma. s/p left pterional craniotomy for clipping of posterior communicating artery aneurysm on 2/24. Extubated 12/19/20. PMH includes schizophrenia, HTN, HLD, depression, anxiety.    PT Comments    Pt soiled in stool and attempting to exit bed without assist upon PT arrival to room. Pt overall requiring min assist for bed mobility, transfers, and gait today, but requires max multimodal cuing for safe mobility. +2 continues to be helpful for safety at this time as pt is very impulsive. Pt ambulated room distance with RW today, progressing well. Will continue to follow acutely.    Follow Up Recommendations  CIR;Supervision for mobility/OOB     Equipment Recommendations  Other (comment) (TBA)    Recommendations for Other Services       Precautions / Restrictions Precautions Precautions: Fall;Other (comment) Precaution Comments: watch RR, restless SBP <160    Mobility  Bed Mobility Overal bed mobility: Needs Assistance Bed Mobility: Supine to Sit;Sit to Supine     Supine to sit: Min assist;HOB elevated Sit to supine: Min assist;HOB elevated   General bed mobility comments: Min assist for trunk management, positioning upon return to supine. Verbal cuing for sequencing tasks to/from EOB.    Transfers Overall transfer level: Needs assistance Equipment used: Rolling walker (2 wheeled);1 person hand held assist Transfers: Sit to/from Stand Sit to Stand: Min assist Stand pivot transfers: Min assist       General transfer comment: Min assist for rise, steady, and pivot to [facility] towards pt's R as pt with fecal incontinence  Ambulation/Gait Ambulation/Gait assistance: Min assist Gait Distance (Feet): 15  Feet Assistive device: Rolling walker (2 wheeled) Gait Pattern/deviations: Step-through pattern;Decreased stride length;Staggering left;Narrow base of support;Trunk flexed Gait velocity: decr   General Gait Details: Min assist to steady, guide RW, verbal cuing for placement in RW and upright posture throughout. Pt bumping into objects on L and R throughout session, PT not correcting pt to see if she could problem solve and does solve without PT intervention ~50% of the time.   Stairs             Wheelchair Mobility    Modified Rankin (Stroke Patients Only) Modified Rankin (Stroke Patients Only) Pre-Morbid Rankin Score: Slight disability Modified Rankin: Moderately severe disability     Balance Overall balance assessment: Needs assistance Sitting-balance support: Bilateral upper extremity supported;Feet supported Sitting balance-Leahy Scale: Fair     Standing balance support: Bilateral upper extremity supported;During functional activity Standing balance-Leahy Scale: Poor Standing balance comment: reliant on external support                            Cognition Arousal/Alertness: Awake/alert Behavior During Therapy: Restless Overall Cognitive Status: Impaired/Different from baseline Area of Impairment: Attention;Orientation;Memory;Following commands;Safety/judgement;Awareness;Problem solving                 Orientation Level: Disoriented to;Place Current Attention Level: Selective Memory: Decreased short-term memory Following Commands: Follows one step commands consistently Safety/Judgement: Decreased awareness of safety Awareness: Emergent Problem Solving: Requires verbal cues General Comments: pt reports she is at home, and that she has never met this PT before. Pt follows commands well, but once command is given pt impulsively executes even if PT  wants pt to wait. Cues for safety throughout mobility.      Exercises      General Comments  General comments (skin integrity, edema, etc.): SpO2 92% and greater on RA during mobility, 2LO2 reapplied at end of session and RN notified. other VSS      Pertinent Vitals/Pain Pain Assessment: Faces Faces Pain Scale: Hurts a little bit Pain Location: generalized with movement Pain Descriptors / Indicators: Grimacing;Guarding Pain Intervention(s): Limited activity within patient's tolerance;Monitored during session;Repositioned    Home Living                      Prior Function            PT Goals (current goals can now be found in the care plan section) Acute Rehab PT Goals Patient Stated Goal: get my walking better PT Goal Formulation: With patient Time For Goal Achievement: 01/03/21 Potential to Achieve Goals: Good Progress towards PT goals: Progressing toward goals    Frequency    Min 4X/week      PT Plan Current plan remains appropriate    Co-evaluation              AM-PAC PT "6 Clicks" Mobility   Outcome Measure  Help needed turning from your back to your side while in a flat bed without using bedrails?: A Little Help needed moving from lying on your back to sitting on the side of a flat bed without using bedrails?: A Little Help needed moving to and from a bed to a chair (including a wheelchair)?: A Little Help needed standing up from a chair using your arms (e.g., wheelchair or bedside chair)?: A Little Help needed to walk in hospital room?: A Little Help needed climbing 3-5 steps with a railing? : A Lot 6 Click Score: 17    End of Session   Activity Tolerance: Patient tolerated treatment well;Patient limited by fatigue Patient left: with call bell/phone within reach;in bed;with bed alarm set Nurse Communication: Mobility status;Other (comment) (no O2 needs during mobility) PT Visit Diagnosis: Muscle weakness (generalized) (M62.81);Difficulty in walking, not elsewhere classified (R26.2);Unsteadiness on feet (R26.81)     Time:  7195-9747 PT Time Calculation (min) (ACUTE ONLY): 22 min  Charges:  $Therapeutic Activity: 8-22 mins                     Stacie Glaze, PT Acute Rehabilitation Services Pager [phone]  Office [phone]  Roxine Caddy E Ruffin Pyo 12/22/2020, 5:15 PM

## 2020-12-22 NOTE — Plan of Care (Signed)

## 2020-12-22 NOTE — Progress Notes (Signed)
IP rehab admissions - I met with patient at the bedside, gave her rehab booklets and explained inpatient rehab.  Patient is hopeful that she can go home in the next few days without needing CIR.  Patient is agreeable for me to open her case and request CIR in case she needs to admit to CIR.  I assisted her to call her friend Annalee Genta who lives with patient.  Patient tells me that she has her own apartment.  Will follow up again once we hear back from insurance carrier.  Call for questions.  [phone]

## 2020-12-22 NOTE — Progress Notes (Signed)
  NEUROSURGERY PROGRESS NOTE   No issues overnight with exception of one episode of bradycardia which resolved spontaneously. Pt without complaint this am. Tolerating regular diet.  EXAM:  BP (!) 165/72 (BP Location: Right Arm)   Pulse 78   Temp 98.7 F (37.1 C) (Oral)   Resp 18   Ht 5' 2.99" (1.6 m) Comment: previous admit  Wt 70 kg   SpO2 99%   BMI 27.34 kg/m   Awake, alert, oriented  Speech fluent, appropriate  CN grossly intact x left CN III palsy 5/5 BUE/BLE   IMPRESSION:  70 y.o. female s/p clipping L Pcom aneurysm, baseline occulomotor palsy. Otherwise doing well. Suspect episodes of bradycardia are vasovagal. Have completed workup in ICU previously for these episodes which was negative.  PLAN: - Cont therpay - Dispo planning, will likely need CIR. - Cont to monitor bradycardia  [name], MD [facility]

## 2020-12-22 NOTE — Care Management Important Message (Signed)
Important Message  Patient Details  Name: Shannon [name] MRN: [id number] Date of Birth: 12/20/1950   Medicare Important Message Given:  Yes     Orbie Pyo 12/22/2020, 3:29 PM

## 2020-12-22 NOTE — Progress Notes (Signed)
Nurse heard tele monitor ringing with Sinus Brady of HR 33 with pauses unsustained and correcting self back to HR of 76 with no interventions. Patient is not symptomatic and no complaints of headaches, chest pain, dizziness/light headed.   Will continue to monitor patient.

## 2020-12-23 LAB — GLUCOSE, CAPILLARY
Glucose-Capillary: 128 mg/dL — ABNORMAL HIGH (ref 70–99)
Glucose-Capillary: 115 mg/dL — ABNORMAL HIGH (ref 70–99)

## 2020-12-23 NOTE — Progress Notes (Signed)
Physical Therapy Treatment Patient Details Name: Shannon [name] MRN: [id number] DOB: Sep 23, 1951 Today's Date: 12/23/2020    History of Present Illness Patient is a 70 y/o female who presents with double vision, found to have a left P-comm aneurysm and a right posterior fossa meningioma. s/p left pterional craniotomy for clipping of posterior communicating artery aneurysm on 2/24. Extubated 12/19/20. PMH includes schizophrenia, HTN, HLD, depression, anxiety.    PT Comments    Pt tolerates treatment well with improved activity tolerance and ambulation distances. Pt demonstrates improved stability during ambulation without loss of balance, but does tend to drift laterally. Pt does not demonstrate any impulsiveness this session, but has been impulsive frequently during admission. Pt continues to benefit from acute PT POC to improve dynamic balance and to reinforce safety cues. PT continues to recommend CIR placement at this time as the pt is at an increased risk for falls and continues to demonstrate cognitive deficits which exacerbate her falls risk.  Follow Up Recommendations  CIR;Supervision for mobility/OOB (HHPT and 24/7 supervision if pt declines CIR)     Equipment Recommendations  Rolling walker with 5" wheels    Recommendations for Other Services       Precautions / Restrictions Precautions Precautions: Fall;Other (comment) Precaution Comments: watch RR, SBP <160 Restrictions Weight Bearing Restrictions: No    Mobility  Bed Mobility Overal bed mobility: Needs Assistance Bed Mobility: Supine to Sit     Supine to sit: Supervision          Transfers Overall transfer level: Needs assistance Equipment used: Rolling walker (2 wheeled) Transfers: Sit to/from Stand Sit to Stand: Min assist         General transfer comment: cues for hand placement, assistance to stabilize  Ambulation/Gait Ambulation/Gait assistance: Min assist Gait Distance (Feet): 60 Feet (additional  trial of 50') Assistive device: Rolling walker (2 wheeled) Gait Pattern/deviations: Step-through pattern Gait velocity: reduced Gait velocity interpretation: <1.8 ft/sec, indicate of risk for recurrent falls General Gait Details: pt with slowed step-through gait, tends to drift to R side   Stairs             Wheelchair Mobility    Modified Rankin (Stroke Patients Only) Modified Rankin (Stroke Patients Only) Pre-Morbid Rankin Score: Slight disability Modified Rankin: Moderately severe disability     Balance Overall balance assessment: Needs assistance Sitting-balance support: No upper extremity supported;Feet supported Sitting balance-Leahy Scale: Good     Standing balance support: Single extremity supported;Bilateral upper extremity supported Standing balance-Leahy Scale: Poor Standing balance comment: reliant on external support                            Cognition Arousal/Alertness: Awake/alert Behavior During Therapy: WFL for tasks assessed/performed Overall Cognitive Status: Impaired/Different from baseline Area of Impairment: Problem solving;Safety/judgement                         Safety/Judgement: Decreased awareness of safety   Problem Solving: Slow processing        Exercises      General Comments General comments (skin integrity, edema, etc.): VSS on RA      Pertinent Vitals/Pain Pain Assessment: No/denies pain    Home Living                      Prior Function            PT Goals (current goals can now  be found in the care plan section) Acute Rehab PT Goals Patient Stated Goal: get my walking better Progress towards PT goals: Progressing toward goals    Frequency    Min 4X/week      PT Plan Current plan remains appropriate    Co-evaluation              AM-PAC PT "6 Clicks" Mobility   Outcome Measure  Help needed turning from your back to your side while in a flat bed without using  bedrails?: A Little Help needed moving from lying on your back to sitting on the side of a flat bed without using bedrails?: A Little Help needed moving to and from a bed to a chair (including a wheelchair)?: A Little Help needed standing up from a chair using your arms (e.g., wheelchair or bedside chair)?: A Little Help needed to walk in hospital room?: A Little Help needed climbing 3-5 steps with a railing? : A Lot 6 Click Score: 17    End of Session   Activity Tolerance: Patient tolerated treatment well Patient left: in chair;with call bell/phone within reach;with chair alarm set Nurse Communication: Mobility status PT Visit Diagnosis: Muscle weakness (generalized) (M62.81);Difficulty in walking, not elsewhere classified (R26.2);Unsteadiness on feet (R26.81)     Time: 2256-7209 PT Time Calculation (min) (ACUTE ONLY): 16 min  Charges:  $Gait Training: 8-22 mins                     Zenaida Niece, PT, DPT Acute Rehabilitation Pager: [phone]    Zenaida Niece 12/23/2020, 12:05 PM

## 2020-12-23 NOTE — Plan of Care (Signed)

## 2020-12-23 NOTE — TOC Initial Note (Signed)
Transition of Care (TOC) - Initial/Assessment Note  Marvetta Gibbons RN,BSN Transitions of Care Unit 4NP (non trauma) - RN Case Manager See Treatment Team for direct Phone #   Patient Details  Name: Shannon [name] MRN: [id number] Date of Birth: October 08, 1951  Transition of Care [facility]) CM/SW Contact:    Dawayne Patricia, RN Phone Number: 12/23/2020, 3:38 PM  Clinical Narrative:                 Pt s/p Crani, from home with roommate, consult placed to CIR for possible admit. Notified today by CIR [facility]- that pt has received insurance approval for INPT rehab admission- and they will have a bed available over the weekend- however pt has stated to them that she does not want to go to CIR and desires to return home.  CM to bedside to speak with pt regarding transition plans. Per Pt she is stating that she has been here "long enough" and does not want to go to [place] rehab. Pt states she wants to return home. Discussed home situation- which pt reports that she lives with roommate, roommate will be there 24/7- does not work currently and will be able to assist her if needed. Discussed with pt the recommendations for a safe discharge and why rehab would be a good choice prior to returning home with roommate- pt still adamantly declining INPT rehab stating she "just wants to go home".  Discussed HH with pt which she states she would be agreeable to - choice offered for [facility] agency and pt states she does not have a preference and defers to [facility] to secure [facility] on her behalf. Pt will need HH orders placed. TOC to f/u on [facility] referral pending orders.      Expected Discharge Plan: IP Rehab Facility Barriers to Discharge: Continued Medical Work up   Patient Goals and CMS Choice Patient states their goals for this hospitalization and ongoing recovery are:: "I want to return home" CMS Medicare.gov Compare Post Acute Care list provided to:: Patient Choice offered to / list presented to : Patient  Expected Discharge Plan  and Services Expected Discharge Plan: [place]   Discharge Planning Services: CM Consult Post Acute Care Choice: [place] Living arrangements for the past 2 months: Apartment                                      Prior Living Arrangements/Services Living arrangements for the past 2 months: Apartment Lives with:: Roommate Patient language and need for interpreter reviewed:: Yes Do you feel safe going back to the place where you live?: Yes      Need for Family Participation in Patient Care: Yes (Comment) Care giver support system in place?: Yes (comment) (pt reports roommate will be there)   Criminal Activity/Legal Involvement Pertinent to Current Situation/Hospitalization: No - Comment as needed  Activities of Daily Living Home Assistive Devices/Equipment: Cane (specify quad or straight) ADL Screening (condition at time of admission) Patient's cognitive ability adequate to safely complete daily activities?: Yes Is the patient deaf or have difficulty hearing?: No Does the patient have difficulty seeing, even when wearing glasses/contacts?: Yes (double vision in L eye) Does the patient have difficulty concentrating, remembering, or making decisions?: No Patient able to express need for assistance with ADLs?: Yes Does the patient have difficulty dressing or bathing?: No Independently performs ADLs?: Yes (appropriate for developmental age)  Does the patient have difficulty walking or climbing stairs?: No Weakness of Legs: None Weakness of Arms/Hands: None  Permission Sought/Granted Permission sought to share information with : Chartered certified accountant granted to share information with : Yes, Verbal Permission Granted     Permission granted to share info w AGENCY: HH        Emotional Assessment Appearance:: Appears older than stated age Attitude/Demeanor/Rapport: Engaged Affect (typically observed):  Appropriate Orientation: : Oriented to Self,Oriented to Place,Oriented to  Time,Oriented to Situation Alcohol / Substance Use: Not Applicable Psych Involvement: No (comment)  Admission diagnosis:  Aneurysm of posterior communicating artery [I67.1] Posterior communicating artery aneurysm [I67.1] Left oculomotor nerve palsy [H49.02] Patient Active Problem List   Diagnosis Date Noted  . Pressure injury of skin 12/20/2020  . Aneurysm of posterior communicating artery 12/14/2020  . Controlled type 2 diabetes mellitus without complication, without long-term current use of insulin ([place]) 11/24/2019  . Homeless 10/30/2012  . Schizophrenia, paranoid type ([place]) 03/03/2012  . Post-menopause 10/03/2011  . Vaginal irritation 10/03/2011   PCP:  Trey Sailors, PA Pharmacy:   [facility], [address] [place] [address] Phone: [phone] Fax: [phone]     Social Determinants of Health (SDOH) Interventions    Readmission Risk Interventions No flowsheet data found.

## 2020-12-23 NOTE — H&P (Incomplete)
Physical Medicine and Rehabilitation Admission H&P    Chief Complaint  Patient presents with  .  Functional deficits s/p aneurysm clipping    HPI: Shannon [name] is a 70 year old female with history of HTN, T2DM, schizophrenia who was admitted from ophthalmology office on 12/14/2020 with L-CN III palsy and reports of diplopia for 1 to 2 weeks.  She was found to have 3 x 3 mm left P-comm origin aneurysm and underwent left pterional craniotomy for clipping of aneurysm by Dr. Kathyrn Sheriff.  Preop Foley placed by Dr. Milford Cage of urology due to urethral meatal stenosis with recommendations to follow up with GU.  Postop course significant for obtundation felt to be due to significant bilateral frontal pneumocephalus.  On 02/25 she was noted to be responding with sluggish but reactive left pupil and had episodes of bradycardia felt to be vagal in nature.  She tolerated extubation on 02/28 and was on Cleviprex for BP management.    Hospital course significant for issues with hypoxia, asymptomatic bradycardia as well as need ABLA. Therapy ongoing and patient noted to have weakness with balance deficits, staggering gait, poor safety awareness, tendency to run into items on both side as well ad difficulty with problem solving. CIR was recommended due to functional deficits affecting ADLs and mobility.    ROS    Past Medical History:  Diagnosis Date  . Anxiety   . Depression   . Diabetes mellitus   . Hyperlipidemia   . Hypertension   . Schizophrenia [facility])     Past Surgical History:  Procedure Laterality Date  . BREAST SURGERY    . CRANIOTOMY Left 12/15/2020   Procedure: LEFT CRANIOTOMY FOR INTRACRANIAL ANEURYSM;  Surgeon: [name], MD;  Location: [place];  Service: Neurosurgery;  Laterality: Left;  . EXPLORATORY LAPAROTOMY    . INSERTION OF SUPRAPUBIC CATHETER  12/15/2020   Procedure: INSERTION OF FOLEY CATHETER;  Surgeon: Remi Haggard, MD;  Location: [facility] OR;  Service: Urology;;     Family History  Problem Relation Age of Onset  . Kidney disease Mother     Social History:  Lives with a friend. Retired--used to clean houses. She  reports that she has been smoking cigarettes--about 8 per day. She has a 40.00 pack-year smoking history. She has never used smokeless tobacco. She reports current alcohol use of about 1.0 standard drink of alcohol per week. She reports that she does not use drugs.    Allergies: No Known Allergies    Medications Prior to Admission  Medication Sig Dispense Refill  . acetaminophen (TYLENOL) 500 MG tablet Take 1,000 mg by mouth every 6 (six) hours as needed for mild pain.    Marland Kitchen albuterol (VENTOLIN HFA) 108 (90 Base) MCG/ACT inhaler Inhale 2 puffs into the lungs every 6 (six) hours as needed for shortness of breath or wheezing.    Marland Kitchen amLODipine (NORVASC) 5 MG tablet Take 1 tablet (5 mg total) by mouth daily. For hypertension. (Patient taking differently: Take 5 mg by mouth daily. For hypertension. Had not filled recently until 12-13-20) 30 tablet 0  . lithium carbonate (LITHOBID) 300 MG CR tablet Take 300 mg by mouth 2 (two) times daily.    Marland Kitchen OLANZapine (ZYPREXA) 20 MG tablet Take 20 mg by mouth at bedtime.      Drug Regimen Review { DRUG REGIMEN ALPFXT:[id number]}  Home: Home Living Family/patient expects to be discharged to:: Private residence Living Arrangements: Non-relatives/Friends Available Help at Discharge: Friend(s),Available PRN/intermittently Type of Home: Apartment  Home Access: Level entry Home Layout: One level Bathroom Toilet: Standard Home Equipment: Kasandra Knudsen - single point  Lives With:  ("friend")   Functional History: Prior Function Level of Independence: Independent Comments: On disability. Does not drive.  Functional Status:  Mobility: Bed Mobility Overal bed mobility: Needs Assistance Bed Mobility: Supine to Sit Rolling: Min guard Sidelying to sit: Mod assist Supine to sit: Supervision Sit to supine: Min  assist,HOB elevated General bed mobility comments: Min assist for trunk management, positioning upon return to supine. Verbal cuing for sequencing tasks to/from EOB. Transfers Overall transfer level: Needs assistance Equipment used: Rolling walker (2 wheeled) Transfers: Sit to/from Stand Sit to Stand: Min assist Stand pivot transfers: Min assist General transfer comment: cues for hand placement, assistance to stabilize Ambulation/Gait Ambulation/Gait assistance: Min assist Gait Distance (Feet): 60 Feet (additional trial of 50') Assistive device: Rolling walker (2 wheeled) Gait Pattern/deviations: Step-through pattern General Gait Details: pt with slowed step-through gait, tends to drift to R side Gait velocity: reduced Gait velocity interpretation: <1.8 ft/sec, indicate of risk for recurrent falls    ADL: ADL Overall ADL's : Needs assistance/impaired Eating/Feeding: NPO Grooming: Minimal assistance,Sitting Upper Body Bathing: Cueing for safety Lower Body Bathing: With adaptive equipment Lower Body Dressing: Maximal assistance,Sit to/from stand Lower Body Dressing Details (indicate cue type and reason): pt able to figure 4 cross with OT placing R LE over L to don sock. pt total (A) with L sock. pt unable to complete supine and requires cues for safety Toilet Transfer: +2 for physical assistance,Minimal assistance,RW,BSC Toilet Transfer Details (indicate cue type and reason): simulated transfer bed to chair. pt with uncontrolled descend to chair Functional mobility during ADLs: +2 for physical assistance,Minimal assistance,Rolling walker General ADL Comments: pt motivated to eat this session. pt very fixated on getting food. Pt set up in the chair for SLP to follow therapy to do food trials  Cognition: Cognition Overall Cognitive Status: Impaired/Different from baseline Arousal/Alertness: Awake/alert Orientation Level: Oriented X4 Attention: Sustained Sustained Attention:  Impaired Sustained Attention Impairment: Verbal basic,Functional basic Memory:  (TBA) Awareness: Impaired Awareness Impairment: Emergent impairment,Anticipatory impairment Problem Solving: Impaired Problem Solving Impairment: Verbal basic,Functional basic Behaviors: Restless,Impulsive Safety/Judgment: Impaired Cognition Arousal/Alertness: Awake/alert Behavior During Therapy: WFL for tasks assessed/performed Overall Cognitive Status: Impaired/Different from baseline Area of Impairment: Problem solving,Safety/judgement Orientation Level: Disoriented to,Place Current Attention Level: Selective Memory: Decreased short-term memory Following Commands: Follows one step commands consistently Safety/Judgement: Decreased awareness of safety Awareness: Emergent Problem Solving: Slow processing General Comments: pt reports she is at home, and that she has never met this PT before. Pt follows commands well, but once command is given pt impulsively executes even if PT wants pt to wait. Cues for safety throughout mobility.  : Blood pressure (!) 140/59, pulse 66, temperature 98.8 F (37.1 C), temperature source Oral, resp. rate 20, height 5' 2.99" (1.6 m), weight 69.4 kg, SpO2 93 %. Physical Exam  Results for orders placed or performed during the hospital encounter of 12/14/20 (from the past 48 hour(s))  Glucose, capillary     Status: Abnormal   Collection Time: 12/24/20 11:59 AM  Result Value Ref Range   Glucose-Capillary 132 (H) 70 - 99 mg/dL    Comment: Glucose reference range applies only to samples taken after fasting for at least 8 hours.   Comment 1 Notify RN    Comment 2 Document in Chart   Glucose, capillary     Status: Abnormal   Collection Time: 12/24/20  4:33 PM  Result Value  Ref Range   Glucose-Capillary 130 (H) 70 - 99 mg/dL    Comment: Glucose reference range applies only to samples taken after fasting for at least 8 hours.   Comment 1 Notify RN    Comment 2 Document in Chart    Glucose, capillary     Status: Abnormal   Collection Time: 12/24/20  9:41 PM  Result Value Ref Range   Glucose-Capillary 144 (H) 70 - 99 mg/dL    Comment: Glucose reference range applies only to samples taken after fasting for at least 8 hours.  Glucose, capillary     Status: Abnormal   Collection Time: 12/25/20  8:15 AM  Result Value Ref Range   Glucose-Capillary 110 (H) 70 - 99 mg/dL    Comment: Glucose reference range applies only to samples taken after fasting for at least 8 hours.   Comment 1 Notify RN    Comment 2 Document in Chart   Glucose, capillary     Status: Abnormal   Collection Time: 12/25/20  2:09 PM  Result Value Ref Range   Glucose-Capillary 139 (H) 70 - 99 mg/dL    Comment: Glucose reference range applies only to samples taken after fasting for at least 8 hours.  Glucose, capillary     Status: Abnormal   Collection Time: 12/25/20  9:43 PM  Result Value Ref Range   Glucose-Capillary 102 (H) 70 - 99 mg/dL    Comment: Glucose reference range applies only to samples taken after fasting for at least 8 hours.  Glucose, capillary     Status: None   Collection Time: 12/26/20  6:09 AM  Result Value Ref Range   Glucose-Capillary 94 70 - 99 mg/dL    Comment: Glucose reference range applies only to samples taken after fasting for at least 8 hours.   No results found.     Medical Problem List and Plan: 1.  *** secondary to ***  -patient may *** shower  -ELOS/Goals: *** 2.  Antithrombotics: -DVT/anticoagulation:  Pharmaceutical: Lovenox  -antiplatelet therapy: N/a 3. Pain Management: Tylenol prn 4. Mood: LCSW to follow for evaluation and support.   -antipsychotic agents: Lithium bid and Zyprexa---were resumed on 03/02 5. Neuropsych: This patient is capable of making decisions on her own behalf. 6. Skin/Wound Care: Routine pressure relief measures.  7. Fluids/Electrolytes/Nutrition: Monitor I/O. Check lytes in am.  --continue Ensure for supplement.  8. HTN:  Monitor BP tid--continue Norvasc.  --continues to be labile.  9. Leucocytosis: Monitor for fevers and other signs of infection.   --likely reactive.  10. Constipation: Continue Senna S.  11. ABLA: Recheck labs in am.  12. T2DM: Diet controlled--will monitor BS ac/hs and add CM restrictions to diet.     ***  Bary Leriche, PA-C 12/26/2020

## 2020-12-23 NOTE — Progress Notes (Signed)
  NEUROSURGERY PROGRESS NOTE   No issues overnight. Pt doing well, no real complaints. Reports able to open left eye more today.  EXAM:  BP (!) 188/65 (BP Location: Left Arm)   Pulse 62   Temp 98.9 F (37.2 C) (Oral)   Resp 20   Ht 5' 2.99" (1.6 m) Comment: previous admit  Wt 69.3 kg   SpO2 99%   BMI 27.07 kg/m   Awake, alert, oriented  Speech fluent, appropriate  CN grossly intact, improved left IIIrd palsy, partial left ptosis, has some medial and upward movement 5/5 BUE/BLE   IMPRESSION:  70 y.o. female s/p clipping of left Pcom aneurysm with improved occulomotor palsy Urethral stricture with Foley in place  PLAN: - Cont PT/OT, working on [employer] placment - Cont Foley for now, will d/w Urology   [name], MD [facility]

## 2020-12-23 NOTE — Progress Notes (Signed)
Inpatient Rehab Admissions Coordinator:  Saw pt at bedside to inform her that we received insurance authorization. However, pt told AC that she doesn't want to go to CIR. She wants to go home.  TOC made aware.  Will sign off.   Gayland Curry, [place], [place] Admissions Coordinator [phone]

## 2020-12-24 LAB — GLUCOSE, CAPILLARY
Glucose-Capillary: 112 mg/dL — ABNORMAL HIGH (ref 70–99)
Glucose-Capillary: 104 mg/dL — ABNORMAL HIGH (ref 70–99)
Glucose-Capillary: 132 mg/dL — ABNORMAL HIGH (ref 70–99)
Glucose-Capillary: 130 mg/dL — ABNORMAL HIGH (ref 70–99)
Glucose-Capillary: 144 mg/dL — ABNORMAL HIGH (ref 70–99)

## 2020-12-24 NOTE — Progress Notes (Signed)
Overall stable.  Patient awake and alert.  She is oriented and appropriate.  Her wound is clean and dry.  Patient is refusing inpatient rehab.  She desires discharge home.  Wound is healing well.  Neck is supple.  She is afebrile.  Overall progressing well.  I would like the patient to mobilize and normalize her activities today.  If stable will plan on discharge home tomorrow.

## 2020-12-24 NOTE — Plan of Care (Signed)

## 2020-12-25 LAB — GLUCOSE, CAPILLARY
Glucose-Capillary: 110 mg/dL — ABNORMAL HIGH (ref 70–99)
Glucose-Capillary: 139 mg/dL — ABNORMAL HIGH (ref 70–99)
Glucose-Capillary: 102 mg/dL — ABNORMAL HIGH (ref 70–99)

## 2020-12-25 NOTE — Progress Notes (Signed)
Inpatient Rehab Admissions Coordinator:   Received call from CM stating Pt. Now interested in CIR after previously declining. I will re-enter consult order and pursue for potential admit pending bed availability.  Clemens Catholic, [place], [place] Admissions Coordinator  [phone] ([place]) [phone] (office)

## 2020-12-25 NOTE — Progress Notes (Signed)
Pt now interested in pursing CIR. Pt understands that she is not walking well enough to go home.

## 2020-12-25 NOTE — Progress Notes (Signed)
No new issues or problems overnight.  Patient now states that she is not walking well enough to go home and thinks that she may need more therapy.  She denies any headache.  Her neurologic exam is stable.  Wound is well-healed.  Patient recovering from subarachnoid hemorrhage status post aneurysm clipping.  Continue supportive efforts.  Discharge disposition unclear at this time.

## 2020-12-26 LAB — GLUCOSE, CAPILLARY
Glucose-Capillary: 94 mg/dL (ref 70–99)
Glucose-Capillary: 114 mg/dL — ABNORMAL HIGH (ref 70–99)
Glucose-Capillary: 98 mg/dL (ref 70–99)

## 2020-12-26 NOTE — Progress Notes (Signed)
Inpatient Rehab Admissions Coordinator:    RN contacted me over the weekend that Pt. Was interested in CIR before going home; however, when I reviewed financial cost with her (her insurance requires a $1480.00 per admission), she declined. She would like to consider short term rehab at [facility]. I will reach out to CM. And sign off.   Clemens Catholic, [place], [place] Admissions Coordinator  [phone] ([place]) [phone] (office)

## 2020-12-26 NOTE — Progress Notes (Signed)
  NEUROSURGERY PROGRESS NOTE   No issues overnight.  Hungry, but otherwise no concerns this am  EXAM:  BP (!) 157/78 (BP Location: Left Arm)   Pulse 66   Temp 98.4 F (36.9 C) (Oral)   Resp 18   Ht 5' 2.99" (1.6 m) Comment: previous admit  Wt 69.4 kg   SpO2 93%   BMI 27.11 kg/m   Awake, alert, oriented  Speech fluent, appropriate  CN grossly intact except partial left CN3 palsy 5/5 BUE/BLE   IMPRESSION/PLAN 70 y.o. female s/p clipping of left Pcom aneurysm with improved CN3 palsy Urethral stricture with Foley in place - CIR candidate. Await insurance decision. - continue Foley per Urology

## 2020-12-26 NOTE — Progress Notes (Signed)
Physical Therapy Treatment Patient Details Name: Shannon [name] MRN: [id number] DOB: June 02, 1951 Today's Date: 12/26/2020    History of Present Illness Patient is a 70 y/o female who presents with double vision, found to have a left P-comm aneurysm and a right posterior fossa meningioma. s/p left pterional craniotomy for clipping of posterior communicating artery aneurysm on 2/24. Extubated 12/19/20. PMH includes schizophrenia, HTN, HLD, depression, anxiety.    PT Comments    The pt was eager to participate in PT session with focus on progression of ambulation and decreased assist for safety with mobility. The pt was able to verbalize needs and perform multiple short bouts of ambulation with use of RW with minA, as well as longer bout of hallway ambulation with minA for RW management and posture. The pt continues to present with impulsivity and decreased safety awareness that impacts her ability to manage transfers or gait without minA and use of AD. The pt continued to demo decreased insight to deficits with asking to transition to walking without AD despite need for BUE support and assist to steer RW during today's session. Will continue to benefit from skilled PT to progress dynamic stability, safety awareness, and endurance.     Follow Up Recommendations  CIR;Supervision for mobility/OOB (HHPT and 24/7 supervision if pt declines CIR)     Equipment Recommendations  Rolling walker with 5" wheels    Recommendations for Other Services       Precautions / Restrictions Precautions Precautions: Fall Precaution Comments: watch RR, SBP <160 Restrictions Weight Bearing Restrictions: No    Mobility  Bed Mobility Overal bed mobility: Needs Assistance Bed Mobility: Supine to Sit     Supine to sit: Min assist     General bed mobility comments: no assist needed to physically move, PT assist for line management/detangling as pt moving prior to having space/freedom from lines to do so     Transfers Overall transfer level: Needs assistance Equipment used: Rolling walker (2 wheeled) Transfers: Sit to/from [facility] Sit to Stand: Min guard Stand pivot transfers: Min assist       General transfer comment: minG to power up but pt not utilizing safe techniques, requires minG for safety. minA to manage RW and stability with pivoting. pt with poor environmental awareness, running into multiple objects without ability to self-correct  Ambulation/Gait Ambulation/Gait assistance: Min assist Gait Distance (Feet): 75 Feet Assistive device: Rolling walker (2 wheeled) Gait Pattern/deviations: Step-through pattern;Trunk flexed Gait velocity: 0.3 m/s Gait velocity interpretation: <1.31 ft/sec, indicative of household ambulator General Gait Details: pt with slow, step through gait. cues for positioning in RW and assist with RW management and steering. Pt running into multiple objects with no ability to self-correct      Modified Rankin (Stroke Patients Only) Modified Rankin (Stroke Patients Only) Pre-Morbid Rankin Score: Slight disability Modified Rankin: Moderately severe disability     Balance Overall balance assessment: Needs assistance Sitting-balance support: No upper extremity supported;Feet supported Sitting balance-Leahy Scale: Good Sitting balance - Comments: able to sit EOB without support if not challenged, heavy anterior leaning intermittently requiring correction from PT/OT   Standing balance support: Single extremity supported;Bilateral upper extremity supported Standing balance-Leahy Scale: Poor Standing balance comment: pt leaning on support surfaces to perform standing self-care. reliant on BUE support for gait with heavy reliance on BUE (pt reports arms most fatigued after walk)  Cognition Arousal/Alertness: Awake/alert Behavior During Therapy: WFL for tasks assessed/performed;Impulsive Overall  Cognitive Status: Impaired/Different from baseline Area of Impairment: Problem solving;Safety/judgement                         Safety/Judgement: Decreased awareness of safety   Problem Solving: Slow processing General Comments: pt with impulsivity with movement through session. able to follow commands for action well, but does not follow command to wait for therapist despite repeated cues. Pt needing verbal cues for hand positioning with each trasnfer      Exercises      General Comments General comments (skin integrity, edema, etc.): VSS on RA      Pertinent Vitals/Pain Pain Assessment: Faces Faces Pain Scale: No hurt Pain Intervention(s): Monitored during session           PT Goals (current goals can now be found in the care plan section) Acute Rehab PT Goals Patient Stated Goal: get my walking better PT Goal Formulation: With patient Time For Goal Achievement: 01/03/21 Potential to Achieve Goals: Good Progress towards PT goals: Progressing toward goals    Frequency    Min 4X/week      PT Plan Current plan remains appropriate       AM-PAC PT "6 Clicks" Mobility   Outcome Measure  Help needed turning from your back to your side while in a flat bed without using bedrails?: A Little Help needed moving from lying on your back to sitting on the side of a flat bed without using bedrails?: A Little Help needed moving to and from a bed to a chair (including a wheelchair)?: A Little Help needed standing up from a chair using your arms (e.g., wheelchair or bedside chair)?: A Little Help needed to walk in hospital room?: A Little Help needed climbing 3-5 steps with a railing? : A Lot 6 Click Score: 17    End of Session Equipment Utilized During Treatment: Gait belt Activity Tolerance: Patient tolerated treatment well Patient left: in chair;with call bell/phone within reach;with chair alarm set Nurse Communication: Mobility status PT Visit Diagnosis:  Muscle weakness (generalized) (M62.81);Difficulty in walking, not elsewhere classified (R26.2);Unsteadiness on feet (R26.81)     Time: 3009-2330 PT Time Calculation (min) (ACUTE ONLY): 23 min  Charges:  $Gait Training: 8-22 mins $Therapeutic Activity: 8-22 mins                     Karma Ganja, PT, DPT   Acute Rehabilitation Department Pager #: [phone]   Otho Bellows 12/26/2020, 11:07 AM

## 2020-12-27 LAB — GLUCOSE, CAPILLARY
Glucose-Capillary: 110 mg/dL — ABNORMAL HIGH (ref 70–99)
Glucose-Capillary: 111 mg/dL — ABNORMAL HIGH (ref 70–99)
Glucose-Capillary: 101 mg/dL — ABNORMAL HIGH (ref 70–99)
Glucose-Capillary: 159 mg/dL — ABNORMAL HIGH (ref 70–99)
Glucose-Capillary: 111 mg/dL — ABNORMAL HIGH (ref 70–99)

## 2020-12-27 NOTE — TOC Progression Note (Signed)
Transition of Care [facility]) - Progression Note    Patient Details  Name: Shannon [name] MRN: [id number] Date of Birth: Oct 18, 1951  [facility] CM/SW [place], [place] Phone Number: 12/27/2020, 8:14 AM  Clinical Narrative:     CSW visit with patient at bedside. CSW introduced self and explained role. CSW discussed PT recommendation of short term rehab at [facility]. Patient explained she declined inpatient rehab because of cost and she really wants to go home. CSW explained the SNF process and advised cost will be covered by her insurance and medicaid. Patient states understanding but declined SNF. She states she has a friend in the home with her and he is able to provide the level of care she needs in the home. Patient states she prefers to discharge with [name]. Patient states no preferred agency. CSW give CSW permission to call her sisters [name] and or [name]- was unable to reach either by phone. RNCM updated.  Patient declined SNF.  Thurmond Butts, MSW, LCSW Clinical Social Worker    Expected Discharge Plan: IP Rehab Facility Barriers to Discharge: Continued Medical Work up  Expected Discharge Plan and Services Expected Discharge Plan: [place]   Discharge Planning Services: CM Consult Post Acute Care Choice: [place] arrangements for the past 2 months: Apartment                                       Social Determinants of Health (SDOH) Interventions    Readmission Risk Interventions No flowsheet data found.

## 2020-12-27 NOTE — Progress Notes (Signed)
  Speech Language Pathology Treatment: Dysphagia;Cognitive-Linquistic  Patient Details Name: Shannon [name] MRN: [id number] DOB: 1951/08/09 Today's Date: 12/27/2020 Time: 4098-1191 SLP Time Calculation (min) (ACUTE ONLY): 19 min  Assessment / Plan / Recommendation Clinical Impression  Pt exhibits improvements in speech intelligibility and dysphagia from previous visit by this therapist. Although dysarthria persists, it is significantly improved. She received a phone call from friend/family and was understood by listening partner not needing to repeat herself.  Her verbal problem solving required assistance and feedback as to why she should not go to bathroom independently at this time. She demonstrated functional use of remote to locate desired channel.  Upper dentures donned (using her personal Polygrip) to masticate regular texture efficiently. No s/s aspiration with straw sips thin or solid. Will upgrade to Dys 3 texture.    HPI HPI: 70 year old female with PMH: diabetes, hyperlipidemia, hypertension, and schizophrenia. Pt found to have 3rd cranial nerve palsy 2/23 after complaints of double vision and directed pt to ED. CT angiogram was done revealing a left PC aneurysm and a right posterior fossa meningioma. On 2/24 underwent left pterional craniotomy and clipping of the posterior communicating artery aneurysm. Intubated 2/24-2/28.      SLP Plan  Continue with current plan of care       Recommendations  Diet recommendations: Dysphagia 3 (mechanical soft);Thin liquid Liquids provided via: Cup;Straw Medication Administration: Whole meds with liquid Supervision: Patient able to self feed Compensations: Small sips/bites;Slow rate Postural Changes and/or Swallow Maneuvers: Seated upright 90 degrees;Upright 30-60 min after meal                Oral Care Recommendations: Oral care BID Follow up Recommendations: Home health SLP SLP Visit Diagnosis: Dysphagia, unspecified  (R13.10) Plan: Continue with current plan of care                      [name] 12/27/2020, 2:52 PM  [name].Ed Risk analyst [phone] Office [phone]

## 2020-12-27 NOTE — Plan of Care (Signed)

## 2020-12-27 NOTE — Progress Notes (Signed)
  NEUROSURGERY PROGRESS NOTE   No issues overnight. No concerns this am CIR too expensive - would like to pursue SNF   EXAM:  BP (!) 121/51 (BP Location: Left Arm)   Pulse 71   Temp (!) 97.5 F (36.4 C)   Resp 20   Ht 5' 2.99" (1.6 m) Comment: previous admit  Wt 69.4 kg   SpO2 100%   BMI 27.11 kg/m   Awake, alert, oriented  Speech slow, but appropriate  Partial left CN3, but otherwise CN grossly intact MAEW, seemingly nonfocal, equal strength  IMPRESSION/PLAN 70 y.o. female s/p clipping of left Pcom aneurysm with improved CN3 palsy Urethral stricture with Foley in place - CIR too expensive. CM notified for SNF. - continue Foley per Urology

## 2020-12-27 NOTE — Progress Notes (Addendum)
Occupational Therapy Treatment Patient Details Name: Shannon [name] MRN: [id number] DOB: 1951-07-12 Today's Date: 12/27/2020    History of present illness Patient is a 70 y/o female who presents with double vision, found to have a left P-comm aneurysm and a right posterior fossa meningioma. s/p left pterional craniotomy for clipping of posterior communicating artery aneurysm on 2/24. Extubated 12/19/20. PMH includes schizophrenia, HTN, HLD, depression, anxiety.   OT comments  Returning for second visit to address diplopia. Providing education and handout on occlusion glasses; taping nasal portion of R lens and pt reports disappearing of diplopia. Pt continue to present with poor cognition and reporting she does not remember meeting or seeing this therapist earlier today. Continue to highly recommend dc to post-acute rehab as pt with poor safety, cognition, and balance. Will continue to follow acutely as admitted.   Follow Up Recommendations  SNF;Home health OT;Supervision/Assistance - 24 hour (Denied SNF. Will need HHOT, HHPT, [place] aide)    Equipment Recommendations  3 in 1 bedside commode    Recommendations for Other Services Rehab consult    Precautions / Restrictions Precautions Precautions: Fall       Mobility Bed Mobility                    Transfers                      Balance                                           ADL either performed or assessed with clinical judgement   ADL Overall ADL's : Needs assistance/impaired                                       General ADL Comments: Focused session on occlusion glasses, diplopia, and education     Vision   Additional Comments: Provided occlusion glasses with nasal position of R lens tapped. Pt reporting decreased diplopia. L eye dominant. dyconjugate gaze. Poor visual attention and difficulty tracking (L>R). Diplopia with distance and reports it disappears with closing  of either eye.   Perception     Praxis      Cognition Arousal/Alertness: Awake/alert (drowsy) Behavior During Therapy: Flat affect Overall Cognitive Status: Impaired/Different from baseline Area of Impairment: Problem solving;Safety/judgement;Awareness                   Current Attention Level: Sustained;Selective Memory: Decreased short-term memory (Does not remember this therapist coming this AM) Following Commands: Follows one step commands consistently Safety/Judgement: Decreased awareness of safety;Decreased awareness of deficits Awareness: Emergent Problem Solving: Slow processing General Comments: Pt poor ST memory and not recalling this therapist coming earilier this AM. Pt continues to present with poor problem solving and awareness.        Exercises Exercises: Other exercises Other Exercises Other Exercises: Having pt participate in depth perception activity. Stacking cups. Demonstrating poor coorindation overall, performing better with glasses.   Shoulder Instructions       General Comments      Pertinent Vitals/ Pain       Pain Assessment: Faces Faces Pain Scale: No hurt Pain Intervention(s): Monitored during session;Limited activity within patient's tolerance;Repositioned  Home Living  Prior Functioning/Environment              Frequency  Min 2X/week        Progress Toward Goals  OT Goals(current goals can now be found in the care plan section)  Progress towards OT goals: Progressing toward goals  Acute Rehab OT Goals Patient Stated Goal: get my walking better OT Goal Formulation: With patient Time For Goal Achievement: 01/03/21 Potential to Achieve Goals: Good ADL Goals Pt Will Perform Grooming: with min assist;sitting Pt Will Perform Upper Body Bathing: with min assist;sitting Pt Will Transfer to Toilet: with mod assist;bedside commode;stand pivot transfer Additional ADL  Goal #1: pt will complete bed mobility supervision level as precursor to adls  Plan Discharge plan remains appropriate    Co-evaluation                 AM-PAC OT "6 Clicks" Daily Activity     Outcome Measure   Help from another person eating meals?: A Little Help from another person taking care of personal grooming?: A Little Help from another person toileting, which includes using toliet, bedpan, or urinal?: A Little Help from another person bathing (including washing, rinsing, drying)?: A Little Help from another person to put on and taking off regular upper body clothing?: A Little Help from another person to put on and taking off regular lower body clothing?: A Little 6 Click Score: 18    End of Session    OT Visit Diagnosis: Unsteadiness on feet (R26.81);Muscle weakness (generalized) (M62.81)   Activity Tolerance Patient tolerated treatment well   Patient Left in bed;with call bell/phone within reach;with bed alarm set   Nurse Communication Mobility status;Precautions        Time: 7342-8768 OT Time Calculation (min): 19 min  Charges: OT General Charges $OT Visit: 1 Visit OT Treatments $Therapeutic Activity: 8-22 mins  [place], OTR/L Acute Rehab Pager: [phone] Office: [place] 12/27/2020, 3:53 PM

## 2020-12-27 NOTE — Progress Notes (Signed)
Nutrition Follow-up  DOCUMENTATION CODES:   Not applicable  INTERVENTION:  Continue Ensure Enlive po BID, each supplement provides 350 kcal and 20 grams of protein.  Encourage adequate PO intake.   NUTRITION DIAGNOSIS:   Inadequate oral intake related to inability to eat as evidenced by NPO status; diet advanced; progressing  GOAL:   Patient will meet greater than or equal to 90% of their needs; progressing  MONITOR:   Diet advancement,PO intake,Supplement acceptance  REASON FOR ASSESSMENT:   Consult,Ventilator Enteral/tube feeding initiation and management  ASSESSMENT:   Pt with PMH of anxiety, depression, DM, HLD, HTN, and schizophrenia admitted with aneurysm and R posterior fossa meningioma s/p L crani for clipping.  2/24 crani for clipping 2/28 extubation  3/2 diet advanced to Dysphagia 2 with Thin liquids   Diet has been advanced to a dysphagia 3 with thin liquids. Meal completion has been mostly 50-100%. Pt currently has Ensure ordered with varied consumption. RD to continue with current nutritional supplementation to aid in caloric and protein needs. Labs and medications reviewed.   Diet Order:   Diet Order            DIET DYS 3 Room service appropriate? Yes with Assist; Fluid consistency: Thin  Diet effective now                 EDUCATION NEEDS:   No education needs have been identified at this time  Skin:  Skin Assessment: Reviewed RN Assessment Skin Integrity Issues:: Stage II Stage II: sacrum  Last BM:  3/6  Height:   Ht Readings from Last 1 Encounters:  12/16/20 5' 2.99" (1.6 m)    Weight:   Wt Readings from Last 1 Encounters:  12/26/20 69.4 kg   BMI:  Body mass index is 27.11 kg/m.  Estimated Nutritional Needs:   Kcal:  1700-2000  Protein:  85-100 grams  Fluid:  > 1.7 L/day  Corrin Parker, MS, RD, LDN RD pager number/after hours weekend pager number on Amion.

## 2020-12-27 NOTE — Progress Notes (Signed)
Report from nightshift RN that pt had been urinating around foley. [place], RN said that she advanced the catheter and replaced the solution in the balloon.   This afternoon when pt got up with PT she got up to bathroom and urinated completely around catheter. Foley bag remaining dry. This RN contacted Dr. Christella Noa to see if the foley could be removed. He said that he admitted the pt but she is with [facility] even though he is listed as the attending. This RN contacted [facility] again and the on-call Dr. Reatha Armour reached back out. Dr. Reatha Armour gave orders to keep foley in place until urology gave orders to remove. Unable to contact urology at this time, but nightshift RN is aware that pt is voiding and to follow-up in am.  Justice Rocher, RN

## 2020-12-27 NOTE — Progress Notes (Addendum)
Physical Therapy Treatment Patient Details Name: Shannon [name] MRN: [id number] DOB: 12/20/1950 Today's Date: 12/27/2020    History of Present Illness Patient is a 70 y/o female admitted 12/14/20 for double vision, found to have a left P-comm aneurysm and a right posterior fossa meningioma. s/p left pterional craniotomy for clipping of posterior communicating artery aneurysm on 2/24. Extubated 12/19/20. PMH includes schizophrenia, HTN, HLD, depression, anxiety.    PT Comments    Pt was able, after a bit of convincing to walk down the hallway, and to the bathroom and back to bed.  She continues to have significant balance deficits as well as safety and awareness issues.  She will likely not be compliant with using the RW as she needs reminders to use it now. She is min assist for balance during gait without an assistive device.  She remains appropriate for post acute rehab at discharge. PT will continue to follow acutely for safe mobility progression. Standing exercises and balance poses next session.    Follow Up Recommendations  SNF (CIR co-pay was too high)     Equipment Recommendations  Rolling walker with 5" wheels    Recommendations for Other Services       Precautions / Restrictions Precautions Precautions: Fall Precaution Comments: SBP <160, double vision Required Braces or Orthoses: Other Brace Other Brace: partial vision occlusion glasses for double vision.    Mobility  Bed Mobility Overal bed mobility: Modified Independent             General bed mobility comments: Moves quickly and without awareness of her lines.  Therapist had to cue her to stop and donn socks for safety.    Transfers Overall transfer level: Needs assistance Equipment used: None Transfers: Sit to/from Stand Sit to Stand: Min guard         General transfer comment: Min guard assist for safety and balance, reminder to put her glasses on to help with double vision.  Almost got up without griper  socks.  Ambulation/Gait Ambulation/Gait assistance: Min assist Gait Distance (Feet): 100 Feet Assistive device: 1 person hand held assist Gait Pattern/deviations: Step-through pattern;Staggering left;Staggering right     General Gait Details: Min assist for balance during gait, staggering pattern, high risk for falls, and likely not to remember to use a RW, so we practiced gait without AD to help train her balance today.  Distracted by going to the bathroom and not wanting to do much more after we walked (declined OOB to chair or exercises).   Stairs             Wheelchair Mobility    Modified Rankin (Stroke Patients Only) Modified Rankin (Stroke Patients Only) Pre-Morbid Rankin Score: Slight disability Modified Rankin: Moderately severe disability     Balance Overall balance assessment: Needs assistance Sitting-balance support: Feet supported;Bilateral upper extremity supported;No upper extremity supported Sitting balance-Leahy Scale: Good     Standing balance support: Single extremity supported Standing balance-Leahy Scale: Fair Standing balance comment: Pt able to stand statically with supervision, however, during dynamic gait activity, she was reaching for objects for support.                            Cognition Arousal/Alertness: Awake/alert Behavior During Therapy: WFL for tasks assessed/performed Overall Cognitive Status: Impaired/Different from baseline Area of Impairment: Safety/judgement;Problem solving                   Current Attention Level: Selective  Memory: Decreased short-term memory;Decreased recall of precautions Following Commands: Follows one step commands consistently Safety/Judgement: Decreased awareness of safety;Decreased awareness of deficits Awareness: Emergent Problem Solving: Slow processing General Comments: Pt with significant safety awareness issues as well as awareness of her deficits and how they relate to her  safety.  She moves quickly and gets tangled up in her lines easily.  Memory deficits not remembering some of the details of an earlier therapy session.      Exercises Other Exercises Other Exercises: Having pt participate in depth perception activity. Stacking cups. Demonstrating poor coorindation overall, performing better with glasses.    General Comments        Pertinent Vitals/Pain Pain Assessment: Faces Faces Pain Scale: No hurt Pain Intervention(s): Monitored during session;Limited activity within patient's tolerance;Repositioned    Home Living                      Prior Function            PT Goals (current goals can now be found in the care plan section) Acute Rehab PT Goals Patient Stated Goal: wants to stay in bed today Progress towards PT goals: Progressing toward goals    Frequency    Min 3X/week      PT Plan Discharge plan needs to be updated    Co-evaluation              AM-PAC PT "6 Clicks" Mobility   Outcome Measure  Help needed turning from your back to your side while in a flat bed without using bedrails?: A Little Help needed moving from lying on your back to sitting on the side of a flat bed without using bedrails?: None Help needed moving to and from a bed to a chair (including a wheelchair)?: A Little Help needed standing up from a chair using your arms (e.g., wheelchair or bedside chair)?: A Little Help needed to walk in hospital room?: A Little Help needed climbing 3-5 steps with a railing? : A Lot 6 Click Score: 18    End of Session   Activity Tolerance: Patient limited by fatigue Patient left: in bed;with call bell/phone within reach;with bed alarm set   PT Visit Diagnosis: Muscle weakness (generalized) (M62.81);Difficulty in walking, not elsewhere classified (R26.2);Unsteadiness on feet (R26.81)     Time: 2035-5974 PT Time Calculation (min) (ACUTE ONLY): 29 min  Charges:  $Gait Training: 8-22 mins $Therapeutic  Activity: 8-22 mins                    Verdene Lennert, PT, DPT  Acute Rehabilitation [phone] pager [phone]) [phone] office

## 2020-12-27 NOTE — Progress Notes (Signed)
Occupational Therapy Treatment Patient Details Name: Shannon [name] MRN: [id number] DOB: May 28, 1951 Today's Date: 12/27/2020    History of present illness Patient is a 70 y/o female who presents with double vision, found to have a left P-comm aneurysm and a right posterior fossa meningioma. s/p left pterional craniotomy for clipping of posterior communicating artery aneurysm on 2/24. Extubated 12/19/20. PMH includes schizophrenia, HTN, HLD, depression, anxiety.   OT comments  Upon arrival, pt OOB in bathroom with chair alarm sounding. Pt continues to present with poor balance and cognition. Pt performing grooming at sink and toileting with Supervision-Min Guard A for safety in standing. Pt requiring Min guard A-Min A for balance during mobility and noting pt with R drift during mobility. Pt also reporting diplopia and presenting with dysconjugate gaze; disappears with closing of one eye. Will return to provide occlusion glasses and provide further therapy. Would recommend dc to post-acute rehab (CIR or SNF), however, pt declining and will need [place] services.     Follow Up Recommendations  SNF;Home health OT;Supervision/Assistance - 24 hour (Denied SNF. Will need HHOT, HHPT, [place] aide)    Equipment Recommendations  3 in 1 bedside commode    Recommendations for Other Services Rehab consult    Precautions / Restrictions Precautions Precautions: Fall Precaution Comments: watch RR, SBP <160       Mobility Bed Mobility               General bed mobility comments: In bathroom with chair alarm sounding upon arrival    Transfers Overall transfer level: Needs assistance Equipment used: None Transfers: Sit to/from Stand Sit to Stand: Supervision;Min guard         General transfer comment: Supervision-Min Guard A for safety    Balance Overall balance assessment: Needs assistance Sitting-balance support: No upper extremity supported;Feet supported Sitting balance-Leahy Scale: Good      Standing balance support: No upper extremity supported;During functional activity Standing balance-Leahy Scale: Fair                             ADL either performed or assessed with clinical judgement   ADL Overall ADL's : Needs assistance/impaired     Grooming: Wash/dry hands;Min guard;Supervision/safety;Standing Grooming Details (indicate cue type and reason): Pt performing hadn hygiene at sink with Supervision for safety                 Toilet Transfer: Supervision/safety;Ambulation;Regular Toilet   Toileting- Clothing Manipulation and Hygiene: Supervision/safety;Sitting/lateral lean       Functional mobility during ADLs: Min guard;Minimal assistance General ADL Comments: Pt presenting with decreased balance, cogition, and vision.     Vision   Vision Assessment?: Yes Eye Alignment: Impaired (comment) Tracking/Visual Pursuits: Unable to hold eye position out of midline;Requires cues, head turns, or add eye shifts to track;Impaired - to be further tested in functional context;Other (comment) (dysconjugate gaze and difficulty tracking throughout) Convergence: Impaired - to be further tested in functional context Diplopia Assessment: Disappears with one eye closed;Present in far gaze Additional Comments: L eye dominant. dyconjugate gaze. Poor visual attention and difficulty tracking (L>R). Diplopia with distance and reports it disappears with closing of either eye.   Perception     Praxis      Cognition Arousal/Alertness: Awake/alert Behavior During Therapy: WFL for tasks assessed/performed;Impulsive Overall Cognitive Status: Impaired/Different from baseline Area of Impairment: Problem solving;Safety/judgement;Awareness  Safety/Judgement: Decreased awareness of safety;Decreased awareness of deficits Awareness: Emergent Problem Solving: Slow processing General Comments: Pt impulsive and presenting with poor awareness  of deficits and safety. Pt requiring increased time for problem solving.        Exercises     Shoulder Instructions       General Comments      Pertinent Vitals/ Pain       Pain Assessment: Faces Faces Pain Scale: No hurt Pain Intervention(s): Monitored during session;Limited activity within patient's tolerance;Repositioned  Home Living                                          Prior Functioning/Environment              Frequency  Min 2X/week        Progress Toward Goals  OT Goals(current goals can now be found in the care plan section)  Progress towards OT goals: Progressing toward goals  Acute Rehab OT Goals Patient Stated Goal: get my walking better OT Goal Formulation: With patient Time For Goal Achievement: 01/03/21 Potential to Achieve Goals: Good ADL Goals Pt Will Perform Grooming: with min assist;sitting Pt Will Perform Upper Body Bathing: with min assist;sitting Pt Will Transfer to Toilet: with mod assist;bedside commode;stand pivot transfer Additional ADL Goal #1: pt will complete bed mobility supervision level as precursor to adls  Plan Discharge plan needs to be updated (CIR denied.)    Co-evaluation                 AM-PAC OT "6 Clicks" Daily Activity     Outcome Measure   Help from another person eating meals?: A Little Help from another person taking care of personal grooming?: A Little Help from another person toileting, which includes using toliet, bedpan, or urinal?: A Little Help from another person bathing (including washing, rinsing, drying)?: A Little Help from another person to put on and taking off regular upper body clothing?: A Little Help from another person to put on and taking off regular lower body clothing?: A Little 6 Click Score: 18    End of Session Equipment Utilized During Treatment: Gait belt  OT Visit Diagnosis: Unsteadiness on feet (R26.81);Muscle weakness (generalized) (M62.81)    Activity Tolerance Patient tolerated treatment well   Patient Left in bed;with call bell/phone within reach;with bed alarm set   Nurse Communication Mobility status;Precautions        Time: 4665-9935 OT Time Calculation (min): 16 min  Charges: OT General Charges $OT Visit: 1 Visit OT Treatments $Self Care/Home Management : 8-22 mins  [place], OTR/L Acute Rehab Pager: [phone] Office: [place] 12/27/2020, 1:00 PM

## 2020-12-28 LAB — GLUCOSE, CAPILLARY: Glucose-Capillary: 116 mg/dL — ABNORMAL HIGH (ref 70–99)

## 2020-12-28 NOTE — TOC Transition Note (Addendum)
Transition of Care (TOC) - CM/SW Discharge Note Marvetta Gibbons RN,BSN Transitions of Care Unit 4NP (non trauma) - RN Case Manager See Treatment Team for direct Phone #    Patient Details  Name: Shannon [name] MRN: [id number] Date of Birth: 04-07-1951  Transition of Care [facility]) CM/SW Contact:  Dawayne Patricia, RN Phone Number: 12/28/2020, 12:08 PM   Clinical Narrative:    Pt stable for transition home, pt has declined both CIR and SNF placement for rehab and desires to return home with roommate. Orders have been placed for HHPT/OT/aide.  As per previous conversations with patient- pt does not have a preference for [facility] needs. Call made to [facility] with [facility] for [facility] referral- referral has been accepted for HHPT/OT/aide.   Pt to [facility] with [facility], sister to provide transportation home,  1215- Notified by bedside RN that pt needs RW, declined any need for 3n1. Order placed for RW and call made to [employer] for DME need- RW to be delivered to room - pt/sister to wait for RW prior to discharge.    Final next level of care: [place] Barriers to Discharge: Barriers Resolved   Patient Goals and CMS Choice Patient states their goals for this hospitalization and ongoing recovery are:: "I want to return home" CMS Medicare.gov Compare Post Acute Care list provided to:: Patient Choice offered to / list presented to : Patient  Discharge Placement               Home with [facility]        Discharge Plan and Services   Discharge Planning Services: CM Consult Post Acute Care Choice: Home Health,Durable Medical Equipment          DME Arranged: N/A DME Agency: NA       HH Arranged: PT,OT,Nurse's Aide HH Agency: [place] ([place]) Date HH Agency Contacted: 12/28/20 Time HH Agency Contacted: 1100 Representative spoke with at [place]: [place] ([place]) Interventions     Readmission Risk Interventions Readmission Risk Prevention  Plan 12/28/2020  Transportation Screening Complete  PCP or Specialist Appt within 5-7 Days Complete  Home Care Screening Complete  Medication Review (RN CM) Complete  Some recent data might be hidden

## 2020-12-28 NOTE — Progress Notes (Signed)
  NEUROSURGERY PROGRESS NOTE   Patient voiding in [facility] despite foley in place. Otherwise NAE No concerns this am. Minimal HA Patient no longer interested in rehab. Wants to be discharged home.  EXAM:  BP (!) 127/51 (BP Location: Left Arm)   Pulse 71   Temp 98.2 F (36.8 C) (Oral)   Resp 18   Ht 5' 2.99" (1.6 m) Comment: previous admit  Wt 69.4 kg   SpO2 98%   BMI 27.11 kg/m   Awake, alert, oriented  Speech slow, but appropriate  Partial left CN3, but otherwise CN grossly intact MAEW, seemingly nonfocal, equal strength  IMPRESSION/PLAN 70 y.o.females/p clipping of left Pcom aneurysm with improvedCN3palsy Urethral stricture with Foley in place - voiding around foley catheter now - CIR too expensive. Patient declines SNF despite discussing how this may be in her best interest. She requests discharge home. States she has adequate support. Will send home with HH.  - remove foley. Outpatient urology f/u

## 2020-12-28 NOTE — Plan of Care (Signed)

## 2020-12-28 NOTE — Progress Notes (Addendum)
Pt with multiple episodes of increased urgency to void. voided multiple times in large amounts. Pt not able to get to bathroom toilet in time, voiding as she ambulated. Foley advanced and balloon re-inflated. Pt continued with episodes of urination in [facility]. Will continue to monitor

## 2020-12-28 NOTE — Discharge Summary (Signed)
Physician Discharge Summary  Patient ID: Shannon [name] MRN: [id number] DOB/AGE: [birth year] 70 y.o.  Admit date: 12/14/2020 Discharge date: 12/28/2020  Admission Diagnoses:  PCOM aneurysm Urethral stricture  Discharge Diagnoses:  Same Active Problems:   Aneurysm of posterior communicating artery   Pressure injury of skin  Discharged Condition: Stable  Hospital Course:  Shannon [name] is a 70 y.o. female who presented to the emergency department on 12/14/2020 after acute onset change in vision resulting in patient going to ophthalmologist who noted a a left cranial nerve 3 palsy.  She underwent CTA head and was found to have a left posterior communicating artery aneurysm.  With the acute nature of the 3rd nerve palsy, urgent treatment of the aneurysm was recommended.  She underwent left craniotomy for clipping of posterior communicating artery aneurysm on 2/24.     Preop was complicated by the inability to insert a Foley catheter due to a urethral stricture.  Urology was consulted  And placed fully in the operating room.  While the initially recommended maintaining the catheter until outpatient follow-up patient was noted to be urinating essentially around the catheter on 12/27/2020.  The catheter was therefore removed on 03/09.  Postop was complicated by slow arousal postanesthesia requiring prolonged ventilation.  She was extubated on 12/19/2020.  She otherwise had an uncomplicated hospital course.  Her cranial nerve 3 palsy gradually improved.  She worked with physical, occupational and speech therapy.  She was initially recommended for the inpatient rehab program.  However cost was too high and patient therefore elected to pursue skilled nursing facility for continued rehab.  After further discussing the plan with her friends, she declined SNF stating she adequate support for d/c home. Despite our best efforts to continue to recommend skilled nursing facility, patient requested discharge  home with home health services.  On 12/28/2020 patient was discharge in hemodynamically stable condition.  Treatments: Surgery 1. 2/24: Left pterional craniotomy for clipping of posterior communicating artery aneurysm, simple 2. 2/24: insertion of foley under anesthesia  Discharge Exam: Blood pressure (!) 127/51, pulse 71, temperature 98.2 F (36.8 C), temperature source Oral, resp. rate 18, height 5' 2.99" (1.6 m), weight 69.4 kg, SpO2 98 %. Awake, alert, oriented Speech fluent, appropriate CN grossly intact 5/5 BUE/BLE, partial left CN3 palsy Wound c/d/i  Disposition: Discharge disposition: 01-Home or Self Care       Discharge Instructions    Call MD for:  difficulty breathing, headache or visual disturbances   Complete by: As directed    Call MD for:  persistant dizziness or light-headedness   Complete by: As directed    Call MD for:  redness, tenderness, or signs of infection (pain, swelling, redness, odor or green/yellow discharge around incision site)   Complete by: As directed    Call MD for:  severe uncontrolled pain   Complete by: As directed    Call MD for:  temperature >100.4   Complete by: As directed    Driving Restrictions   Complete by: As directed    Do not drive until given clearance.   Increase activity slowly   Complete by: As directed    Lifting restrictions   Complete by: As directed    Do not lift anything >10lbs. Avoid bending and twisting in awkward positions. Avoid bending at the back.   May shower / Bathe   Complete by: As directed    In 24 hours. Okay to wash wound with warm soapy water. Avoid scrubbing the wound. [place]  dry.   No wound care   Complete by: As directed      Allergies as of 12/28/2020   No Known Allergies     Medication List    TAKE these medications   acetaminophen 500 MG tablet Commonly known as: TYLENOL Take 1,000 mg by mouth every 6 (six) hours as needed for mild pain.   albuterol 108 (90 Base) MCG/ACT  inhaler Commonly known as: VENTOLIN HFA Inhale 2 puffs into the lungs every 6 (six) hours as needed for shortness of breath or wheezing.   amLODipine 5 MG tablet Commonly known as: NORVASC Take 1 tablet (5 mg total) by mouth daily. For hypertension. What changed: additional instructions   lithium carbonate 300 MG CR tablet Commonly known as: LITHOBID Take 300 mg by mouth 2 (two) times daily.   OLANZapine 20 MG tablet Commonly known as: ZYPREXA Take 20 mg by mouth at bedtime.       Follow-up Information    [name], MD. Schedule an appointment as soon as possible for a visit in 1 week(s).   Specialty: Neurosurgery Contact information: [address] [place] [address] [phone]               Signed: Traci Sermon 12/28/2020, 9:17 AM

## 2020-12-30 DIAGNOSIS — Z79899 Other long term (current) drug therapy: Secondary | ICD-10-CM | POA: Diagnosis not present

## 2020-12-30 DIAGNOSIS — F17210 Nicotine dependence, cigarettes, uncomplicated: Secondary | ICD-10-CM | POA: Diagnosis not present

## 2020-12-30 DIAGNOSIS — I671 Cerebral aneurysm, nonruptured: Secondary | ICD-10-CM | POA: Diagnosis not present

## 2020-12-30 DIAGNOSIS — D320 Benign neoplasm of cerebral meninges: Secondary | ICD-10-CM | POA: Diagnosis not present

## 2020-12-30 DIAGNOSIS — Z7951 Long term (current) use of inhaled steroids: Secondary | ICD-10-CM | POA: Diagnosis not present

## 2020-12-30 DIAGNOSIS — I10 Essential (primary) hypertension: Secondary | ICD-10-CM | POA: Diagnosis not present

## 2020-12-30 DIAGNOSIS — E119 Type 2 diabetes mellitus without complications: Secondary | ICD-10-CM | POA: Diagnosis not present

## 2020-12-30 DIAGNOSIS — Z9181 History of falling: Secondary | ICD-10-CM | POA: Diagnosis not present

## 2020-12-30 DIAGNOSIS — E785 Hyperlipidemia, unspecified: Secondary | ICD-10-CM | POA: Diagnosis not present

## 2020-12-30 DIAGNOSIS — Z48812 Encounter for surgical aftercare following surgery on the circulatory system: Secondary | ICD-10-CM | POA: Diagnosis not present

## 2021-01-04 DIAGNOSIS — E785 Hyperlipidemia, unspecified: Secondary | ICD-10-CM | POA: Diagnosis not present

## 2021-01-04 DIAGNOSIS — E119 Type 2 diabetes mellitus without complications: Secondary | ICD-10-CM | POA: Diagnosis not present

## 2021-01-04 DIAGNOSIS — Z7951 Long term (current) use of inhaled steroids: Secondary | ICD-10-CM | POA: Diagnosis not present

## 2021-01-04 DIAGNOSIS — Z79899 Other long term (current) drug therapy: Secondary | ICD-10-CM | POA: Diagnosis not present

## 2021-01-04 DIAGNOSIS — Z48812 Encounter for surgical aftercare following surgery on the circulatory system: Secondary | ICD-10-CM | POA: Diagnosis not present

## 2021-01-04 DIAGNOSIS — D320 Benign neoplasm of cerebral meninges: Secondary | ICD-10-CM | POA: Diagnosis not present

## 2021-01-04 DIAGNOSIS — I10 Essential (primary) hypertension: Secondary | ICD-10-CM | POA: Diagnosis not present

## 2021-01-04 DIAGNOSIS — F17210 Nicotine dependence, cigarettes, uncomplicated: Secondary | ICD-10-CM | POA: Diagnosis not present

## 2021-01-04 DIAGNOSIS — Z9181 History of falling: Secondary | ICD-10-CM | POA: Diagnosis not present

## 2021-01-04 DIAGNOSIS — I671 Cerebral aneurysm, nonruptured: Secondary | ICD-10-CM | POA: Diagnosis not present

## 2021-01-10 DIAGNOSIS — H4902 Third [oculomotor] nerve palsy, left eye: Secondary | ICD-10-CM | POA: Diagnosis not present

## 2021-01-11 DIAGNOSIS — Z48812 Encounter for surgical aftercare following surgery on the circulatory system: Secondary | ICD-10-CM | POA: Diagnosis not present

## 2021-01-11 DIAGNOSIS — F17210 Nicotine dependence, cigarettes, uncomplicated: Secondary | ICD-10-CM | POA: Diagnosis not present

## 2021-01-11 DIAGNOSIS — Z9181 History of falling: Secondary | ICD-10-CM | POA: Diagnosis not present

## 2021-01-11 DIAGNOSIS — I10 Essential (primary) hypertension: Secondary | ICD-10-CM | POA: Diagnosis not present

## 2021-01-11 DIAGNOSIS — D320 Benign neoplasm of cerebral meninges: Secondary | ICD-10-CM | POA: Diagnosis not present

## 2021-01-11 DIAGNOSIS — Z7951 Long term (current) use of inhaled steroids: Secondary | ICD-10-CM | POA: Diagnosis not present

## 2021-01-11 DIAGNOSIS — E119 Type 2 diabetes mellitus without complications: Secondary | ICD-10-CM | POA: Diagnosis not present

## 2021-01-11 DIAGNOSIS — I671 Cerebral aneurysm, nonruptured: Secondary | ICD-10-CM | POA: Diagnosis not present

## 2021-01-11 DIAGNOSIS — Z79899 Other long term (current) drug therapy: Secondary | ICD-10-CM | POA: Diagnosis not present

## 2021-01-11 DIAGNOSIS — E785 Hyperlipidemia, unspecified: Secondary | ICD-10-CM | POA: Diagnosis not present

## 2021-02-22 DIAGNOSIS — I671 Cerebral aneurysm, nonruptured: Secondary | ICD-10-CM | POA: Diagnosis not present

## 2021-02-22 DIAGNOSIS — I10 Essential (primary) hypertension: Secondary | ICD-10-CM | POA: Diagnosis not present

## 2021-02-28 DIAGNOSIS — H4902 Third [oculomotor] nerve palsy, left eye: Secondary | ICD-10-CM | POA: Diagnosis not present

## 2021-05-12 ENCOUNTER — Emergency Department (HOSPITAL_COMMUNITY)
Admission: EM | Admit: 2021-05-12 | Discharge: 2021-05-15 | Disposition: A | Discharge status: Home / Self Care | Payer: Medicare Other | Attending: Emergency Medicine | Admitting: Emergency Medicine

## 2021-05-12 ENCOUNTER — Encounter (HOSPITAL_COMMUNITY): Payer: Self-pay | Admitting: Emergency Medicine

## 2021-05-12 ENCOUNTER — Other Ambulatory Visit: Payer: Self-pay

## 2021-05-12 ENCOUNTER — Emergency Department (HOSPITAL_COMMUNITY): Payer: Medicare Other

## 2021-05-12 DIAGNOSIS — F17210 Nicotine dependence, cigarettes, uncomplicated: Secondary | ICD-10-CM | POA: Insufficient documentation

## 2021-05-12 DIAGNOSIS — E1169 Type 2 diabetes mellitus with other specified complication: Secondary | ICD-10-CM | POA: Insufficient documentation

## 2021-05-12 DIAGNOSIS — I10 Essential (primary) hypertension: Secondary | ICD-10-CM | POA: Insufficient documentation

## 2021-05-12 DIAGNOSIS — N898 Other specified noninflammatory disorders of vagina: Secondary | ICD-10-CM | POA: Diagnosis present

## 2021-05-12 DIAGNOSIS — E785 Hyperlipidemia, unspecified: Secondary | ICD-10-CM | POA: Diagnosis not present

## 2021-05-12 DIAGNOSIS — Z79899 Other long term (current) drug therapy: Secondary | ICD-10-CM | POA: Insufficient documentation

## 2021-05-12 DIAGNOSIS — Y900 Blood alcohol level of less than 20 mg/100 ml: Secondary | ICD-10-CM | POA: Insufficient documentation

## 2021-05-12 DIAGNOSIS — Z20822 Contact with and (suspected) exposure to covid-19: Secondary | ICD-10-CM | POA: Insufficient documentation

## 2021-05-12 DIAGNOSIS — F200 Paranoid schizophrenia: Secondary | ICD-10-CM | POA: Diagnosis not present

## 2021-05-12 DIAGNOSIS — F209 Schizophrenia, unspecified: Secondary | ICD-10-CM

## 2021-05-12 DIAGNOSIS — Z046 Encounter for general psychiatric examination, requested by authority: Secondary | ICD-10-CM | POA: Diagnosis present

## 2021-05-12 LAB — URINALYSIS, ROUTINE W REFLEX MICROSCOPIC
Bacteria, UA: NONE SEEN
Bilirubin Urine: NEGATIVE
Glucose, UA: NEGATIVE mg/dL
Hgb urine dipstick: NEGATIVE
Ketones, ur: NEGATIVE mg/dL
Nitrite: NEGATIVE
Protein, ur: 30 mg/dL — AB
Specific Gravity, Urine: 1.017 (ref 1.005–1.030)
pH: 5 (ref 5.0–8.0)

## 2021-05-12 LAB — CBC WITH DIFFERENTIAL/PLATELET
Abs Immature Granulocytes: 0.01 10*3/uL (ref 0.00–0.07)
Basophils Absolute: 0 10*3/uL (ref 0.0–0.1)
Basophils Relative: 1 %
Eosinophils Absolute: 0.1 10*3/uL (ref 0.0–0.5)
Eosinophils Relative: 2 %
HCT: 41.1 % (ref 36.0–46.0)
Hemoglobin: 13 g/dL (ref 12.0–15.0)
Immature Granulocytes: 0 %
Lymphocytes Relative: 32 %
Lymphs Abs: 2 10*3/uL (ref 0.7–4.0)
MCH: 24.3 pg — ABNORMAL LOW (ref 26.0–34.0)
MCHC: 31.6 g/dL (ref 30.0–36.0)
MCV: 76.8 fL — ABNORMAL LOW (ref 80.0–100.0)
Monocytes Absolute: 0.5 10*3/uL (ref 0.1–1.0)
Monocytes Relative: 9 %
Neutro Abs: 3.5 10*3/uL (ref 1.7–7.7)
Neutrophils Relative %: 56 %
Platelets: 257 10*3/uL (ref 150–400)
RBC: 5.35 MIL/uL — ABNORMAL HIGH (ref 3.87–5.11)
RDW: 15 % (ref 11.5–15.5)
WBC: 6.2 10*3/uL (ref 4.0–10.5)
nRBC: 0 % (ref 0.0–0.2)

## 2021-05-12 LAB — COMPREHENSIVE METABOLIC PANEL
ALT: 15 U/L (ref 0–44)
AST: 17 U/L (ref 15–41)
Albumin: 3.7 g/dL (ref 3.5–5.0)
Alkaline Phosphatase: 102 U/L (ref 38–126)
Anion gap: 6 (ref 5–15)
BUN: 14 mg/dL (ref 8–23)
CO2: 27 mmol/L (ref 22–32)
Calcium: 9.3 mg/dL (ref 8.9–10.3)
Chloride: 106 mmol/L (ref 98–111)
Creatinine, Ser: 0.67 mg/dL (ref 0.44–1.00)
GFR, Estimated: >60 mL/min (ref >60)
Glucose, Bld: 102 mg/dL — ABNORMAL HIGH (ref 70–99)
Potassium: 4.5 mmol/L (ref 3.5–5.1)
Sodium: 139 mmol/L (ref 135–145)
Total Bilirubin: 0.4 mg/dL (ref 0.3–1.2)
Total Protein: 7.1 g/dL (ref 6.5–8.1)

## 2021-05-12 LAB — HIV ANTIBODY (ROUTINE TESTING W REFLEX): HIV Screen 4th Generation wRfx: NONREACTIVE

## 2021-05-12 LAB — RAPID URINE DRUG SCREEN, HOSP PERFORMED
Amphetamines: NOT DETECTED
Barbiturates: NOT DETECTED
Benzodiazepines: NOT DETECTED
Cocaine: NOT DETECTED
Opiates: NOT DETECTED
Tetrahydrocannabinol: NOT DETECTED

## 2021-05-12 LAB — RESP PANEL BY RT-PCR (FLU A&B, COVID) ARPGX2
Influenza A by PCR: NEGATIVE
Influenza B by PCR: NEGATIVE
SARS Coronavirus 2 by RT PCR: NEGATIVE

## 2021-05-12 LAB — ETHANOL: Alcohol, Ethyl (B): <10 mg/dL (ref <10)

## 2021-05-12 LAB — LITHIUM LEVEL: Lithium Lvl: <0.06 mmol/L — ABNORMAL LOW (ref 0.60–1.20)

## 2021-05-12 MED ORDER — ZIPRASIDONE MESYLATE 20 MG IM SOLR
20.0000 mg | Freq: Once | INTRAMUSCULAR | Status: AC
  Administered 2021-05-12: 20 mg via INTRAMUSCULAR
  Filled 2021-05-12: qty 20

## 2021-05-12 MED ORDER — AMLODIPINE BESYLATE 5 MG PO TABS
5.0000 mg | ORAL_TABLET | Freq: Every day | ORAL | Status: DC
  Administered 2021-05-12 – 2021-05-15 (×4): 5 mg via ORAL
  Filled 2021-05-12 (×4): qty 1

## 2021-05-12 MED ORDER — OLANZAPINE 10 MG PO TABS
20.0000 mg | ORAL_TABLET | Freq: Every day | ORAL | Status: DC
  Administered 2021-05-12 – 2021-05-14 (×3): 20 mg via ORAL
  Filled 2021-05-12 (×3): qty 2

## 2021-05-12 NOTE — ED Notes (Signed)
Patient is shouting out to others as they pass by her room.  Patient has both legs spread and had urinated in the bed.  A safety sitter has been ordered for safety measures because the patient will not listen to any directions

## 2021-05-12 NOTE — ED Notes (Signed)
Engineer, structural is leaving.
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
## 2021-05-12 NOTE — ED Notes (Signed)
Patient walks around the room even after being told that she needs to remain in the bed.  The patient reports that she is cleaning up her room,
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
## 2021-05-12 NOTE — ED Notes (Signed)
Appears to be sleeping at this time.

## 2021-05-12 NOTE — ED Notes (Signed)
Patient appears more calm and cooperative at this time

## 2021-05-12 NOTE — ED Notes (Signed)
Becoming more cooperative and calm
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
## 2021-05-12 NOTE — ED Notes (Signed)
Patient had blood draws done- Patient was resting with eyes closed and cooperative at this time
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
## 2021-05-12 NOTE — ED Notes (Signed)
Patient given sandwich and soda per request.  Patient is pleasantly psychotic at the moment stating, "I'm pregnant.  I haven't had my ultrasound yet because I'm only 2 months."  Patient then pulled up her shirt to show staff her abdomen.

## 2021-05-12 NOTE — ED Notes (Signed)
Patient is angry, slamming the door

## 2021-05-12 NOTE — ED Notes (Signed)
Patient screaming, refusing any type of direction, patient screams "I will beat your ass and everyone's else if you try to make me take any medication."  Patient appears very angry and defensive.  Security here and the patient is screaming at them also
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
## 2021-05-13 DIAGNOSIS — F200 Paranoid schizophrenia: Secondary | ICD-10-CM | POA: Diagnosis not present

## 2021-05-13 LAB — RPR: RPR Ser Ql: NONREACTIVE

## 2021-05-13 MED ORDER — ZIPRASIDONE MESYLATE 20 MG IM SOLR: 20.0000 mg | INTRAMUSCULAR | Status: DC | PRN

## 2021-05-13 MED ORDER — LORAZEPAM 1 MG PO TABS
1.0000 mg | ORAL_TABLET | ORAL | Status: DC | PRN
  Filled 2021-05-13: qty 1

## 2021-05-13 MED ORDER — OLANZAPINE 5 MG PO TBDP: 5.0000 mg | ORAL_TABLET | Freq: Three times a day (TID) | ORAL | Status: DC | PRN

## 2021-05-13 NOTE — Progress Notes (Signed)
Per Suzzanne Cloud, patient meets criteria for inpatient treatment. There are no available or appropriate beds at [facility] today. CSW faxed referrals to the following facilities for review:  [facility]  TTS will continue to seek bed placement.  Glennie Isle, MSW, [place], LCAS-A Phone: [phone] Disposition/TOC

## 2021-05-13 NOTE — ED Provider Notes (Signed)
Emergency Medicine Observation Re-evaluation Note  Shannon [name] is a 70 y.o. female, seen on rounds today.  Pt initially presented to the ED for complaints of Mental Health Problem Currently, the patient is resting comfortably.  Physical Exam  BP (!) 146/59 (BP Location: Right Arm)   Pulse 71   Temp (!) 97.3 F (36.3 C) (Oral)   Resp 16   Ht '5\' 3"'$  (1.6 m)   Wt 68 kg   SpO2 97%   BMI 26.57 kg/m  Physical Exam General: resting, asleep  ED Course / MDM  EKG:EKG Interpretation  Date/Time:  Friday May 12 2021 06:11:28 EDT Ventricular Rate:  68 PR Interval:  120 QRS Duration: 114 QT Interval:  381 QTC Calculation: 406 R Axis:   52 Text Interpretation: Sinus arrhythmia Probable left atrial enlargement Confirmed by Palumbo, April ([id number]) on 05/12/2021 6:36:05 AM  I have reviewed the labs performed to date as well as medications administered while in observation.  Recent changes in the last 24 hours include no acute events reported by staff.  Plan  Current plan is for geri-psych placement.  Shannon [name] is under involuntary commitment.     Valarie Merino, MD 05/13/21 [phone]

## 2021-05-13 NOTE — ED Notes (Signed)
Pt alert this shift.  Confusion noted. Delusional thoughts. Disorganized. Labile. Pressured, rapid speech. Loud. Restless. Cooperative with redirection.

## 2021-05-14 DIAGNOSIS — F200 Paranoid schizophrenia: Secondary | ICD-10-CM | POA: Diagnosis not present

## 2021-05-14 MED ORDER — LITHIUM CARBONATE ER 300 MG PO TBCR
300.0000 mg | EXTENDED_RELEASE_TABLET | Freq: Two times a day (BID) | ORAL | Status: DC
  Administered 2021-05-14 – 2021-05-15 (×2): 300 mg via ORAL
  Filled 2021-05-14 (×2): qty 1
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
## 2021-05-14 NOTE — Progress Notes (Addendum)
Per Suzzanne Cloud, patient meets criteria for inpatient treatment. There are no available or appropriate beds at [facility] today. CSW faxed referrals to the following facilities for review:  [place]. [address] [place] [address] [phone] --  [facility]  Pending - Request Sent N/A [address], [place] [address] [phone] --  [place]  Pending - Request Sent N/A [address] [place] [address] [phone] --  [place] [address], [place] [address] [phone] [place] N/A [address], [place] [address] [phone] --  [facility]  Pending - Request Sent N/A [address] [place] [address] [phone] --  [facility]  Pending - Request Sent N/A [address] [place] [address] [phone] --  [facility]  Pending - Request Sent N/A [address] [place], [place] [address] [phone] --  [place] [address] [place] [address] [phone] --  [facility]  Pending - Request Sent N/A [address], [place] [address] [phone] --  [place] N/A [address] [place] [address] [phone] --  [facility]  Pending - Request Sent N/A [address], [place] [address] [phone] --  [place] [address], [place] [address] [phone] --  [facility]  [facility] Office  Pending - Request Sent N/A [address], [place] [address] [id number] [phone] --   Added: [facility]   TTS will continue to seek bed placement.  Glennie Isle, MSW, [place], LCAS-A Phone: [phone] Disposition/TOC

## 2021-05-14 NOTE — Progress Notes (Signed)
[facility] contacted CSW in reference to reviewing the patient for possible placement. It was reported that this patient will be presented to the doctor at the facility.   Glennie Isle, MSW, [place], LCAS-A Phone: [phone] Disposition/TOC

## 2021-05-14 NOTE — ED Notes (Signed)
Writer called Sheriff to arrange transport.  Dispatcher advised transport may not be available until tomorrow.  He gave additional numbers to call.  Left VM at [phone] and [phone].

## 2021-05-14 NOTE — ED Notes (Signed)
IVC paperwork faxed. RN attempted to call report to [facility] at [phone] with no answer at this time. Pt can arrive at any time after they receive report. Sheriff will need to transport pt to  [facility]  [place]  [place]

## 2021-05-14 NOTE — ED Provider Notes (Signed)
Emergency Medicine Observation Re-evaluation Note  Shannon [name] is a 70 y.o. female, seen on rounds today.  Pt initially presented to the ED for complaints of Mental Health Problem Currently, the patient is resting/comfortable.  Physical Exam  BP (!) 172/81 (BP Location: Left Arm)   Pulse (!) 52   Temp 97.6 F (36.4 C)   Resp 18   Ht '5\' 3"'$  (1.6 m)   Wt 68 kg   SpO2 100%   BMI 26.57 kg/m  Physical Exam General: asleep/comfortable   ED Course / MDM  EKG:EKG Interpretation  Date/Time:  Friday May 12 2021 06:11:28 EDT Ventricular Rate:  68 PR Interval:  120 QRS Duration: 114 QT Interval:  381 QTC Calculation: 406 R Axis:   52 Text Interpretation: Sinus arrhythmia Probable left atrial enlargement Confirmed by Palumbo, April ([id number]) on 05/12/2021 6:36:05 AM  I have reviewed the labs performed to date as well as medications administered while in observation.  Recent changes in the last 24 hours include no acute events reported by staff.  Plan  Current plan is for placement. Shannon [name] is under involuntary commitment.      Valarie Merino, MD 05/14/21 [phone]
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
## 2021-05-14 NOTE — Progress Notes (Signed)
Per Peter Congo, pt has been accepted to [facility]. Accepting provider is Dr. Franchot Mimes. Patient can arrive anytime. Number for report is [phone]. It was reported that the patient's IVC paperwork needs to be faxed to fax#: [phone] before calling report.    Glennie Isle, MSW, LCSW-A Phone: [phone] Disposition/TOC

## 2021-05-14 NOTE — ED Notes (Signed)
Pt sister is in room with pt, sitter at bedside

## 2021-05-15 DIAGNOSIS — F200 Paranoid schizophrenia: Secondary | ICD-10-CM | POA: Diagnosis not present

## 2021-05-15 LAB — GC/CHLAMYDIA PROBE AMP (~~LOC~~) NOT AT ARMC
Chlamydia: NEGATIVE
Comment: NEGATIVE
Comment: NORMAL
Neisseria Gonorrhea: NEGATIVE

## 2021-05-15 LAB — RESP PANEL BY RT-PCR (FLU A&B, COVID) ARPGX2
Influenza A by PCR: NEGATIVE
Influenza B by PCR: NEGATIVE
SARS Coronavirus 2 by RT PCR: NEGATIVE

## 2021-05-15 NOTE — ED Notes (Addendum)
Received call earlier this shift from staff member at [facility] stating repeat Covid would be needed if patient was not able to get there by 6:29am.

## 2021-05-15 NOTE — ED Notes (Signed)
Pt off unit to facility per provider. Pt calm, cooperative, no s/s of distress.DC information given to sheriff for transport. Belongings given to sheriff for transport. Pt ambulatory off unit, escorted and transported by sheriff.

## 2021-05-15 NOTE — ED Notes (Signed)
Writer has attempted to call report multiple times this morning with no success.

## 2021-05-15 NOTE — ED Provider Notes (Signed)
Emergency Medicine Observation Re-evaluation Note  Shannon [name] is a 70 y.o. female, seen on rounds today.  Pt initially presented to the ED for complaints of Mental Health Problem Currently, the patient is no distress. She is up in her room and is calm.  Physical Exam  BP (!) 162/80 (BP Location: Left Arm)   Pulse (!) 49   Temp 98 F (36.7 C) (Oral)   Resp 14   Ht '5\' 3"'$  (1.6 m)   Wt 68 kg   SpO2 99%   BMI 26.57 kg/m  Physical Exam General: nad Lungs: no distress Psych: calm  ED Course / MDM  EKG:EKG Interpretation  Date/Time:  Friday May 12 2021 06:11:28 EDT Ventricular Rate:  68 PR Interval:  120 QRS Duration: 114 QT Interval:  381 QTC Calculation: 406 R Axis:   52 Text Interpretation: Sinus arrhythmia Probable left atrial enlargement Confirmed by Palumbo, April ([id number]) on 05/12/2021 6:36:05 AM  I have reviewed the labs performed to date as well as medications administered while in observation.  Recent changes in the last 24 hours include none.  Plan  Current plan is for transfer to [facility] today. Shannon [name] is under involuntary commitment.      Arnaldo Natal, MD 05/15/21 [phone]

## 2021-05-16 DIAGNOSIS — E785 Hyperlipidemia, unspecified: Secondary | ICD-10-CM | POA: Diagnosis not present

## 2021-05-16 DIAGNOSIS — I10 Essential (primary) hypertension: Secondary | ICD-10-CM | POA: Diagnosis not present

## 2021-05-16 DIAGNOSIS — E119 Type 2 diabetes mellitus without complications: Secondary | ICD-10-CM | POA: Diagnosis not present

## 2021-05-16 LAB — URINE CULTURE: Culture: 10000 — AB

## 2021-05-17 ENCOUNTER — Telehealth: Payer: Self-pay

## 2021-05-17 DIAGNOSIS — E559 Vitamin D deficiency, unspecified: Secondary | ICD-10-CM | POA: Diagnosis not present

## 2021-05-17 NOTE — Progress Notes (Signed)
ED Antimicrobial Stewardship Positive Culture Follow Up   Shannon [name] is an 70 y.o. female who presented to [facility] on 05/12/2021 with a chief complaint of  Chief Complaint  Patient presents with   Mental Health Problem    Recent Results (from the past 720 hour(s))  Urine Culture     Status: Abnormal   Collection Time: 05/12/21  6:17 AM   Specimen: Urine, Clean Catch  Result Value Ref Range Status   Specimen Description   Final    URINE, CLEAN CATCH Performed at [facility], [place] [address], [place], [place] [address]    Special Requests   Final    NONE Performed at [facility], [place] [address], [place], [place] [address]    Culture 10,000 COLONIES/mL ENTEROCOCCUS FAECALIS (A)  Final   Report Status 05/16/2021 FINAL  Final   Organism ID, Bacteria ENTEROCOCCUS FAECALIS (A)  Final      Susceptibility   Enterococcus faecalis - MIC*    AMPICILLIN <=2 SENSITIVE Sensitive     NITROFURANTOIN <=16 SENSITIVE Sensitive     VANCOMYCIN 1 SENSITIVE Sensitive     * 10,000 COLONIES/mL ENTEROCOCCUS FAECALIS  Resp Panel by RT-PCR (Flu A&B, Covid) Nasopharyngeal Swab     Status: None   Collection Time: 05/12/21  6:29 AM   Specimen: Nasopharyngeal Swab; Nasopharyngeal(NP) swabs in vial transport medium  Result Value Ref Range Status   SARS Coronavirus 2 by RT PCR NEGATIVE NEGATIVE Final    Comment: (NOTE) SARS-CoV-2 target nucleic acids are NOT DETECTED.  The SARS-CoV-2 RNA is generally detectable in upper respiratory specimens during the acute phase of infection. The lowest concentration of SARS-CoV-2 viral copies this assay can detect is 138 copies/mL. A negative result does not preclude SARS-Cov-2 infection and should not be used as the sole basis for treatment or other patient management decisions. A negative result may occur with  improper specimen collection/handling, submission of specimen other than nasopharyngeal swab, presence of viral  mutation(s) within the areas targeted by this assay, and inadequate number of viral copies(<138 copies/mL). A negative result must be combined with clinical observations, patient history, and epidemiological information. The expected result is Negative.  Fact Sheet for Patients:  EntrepreneurPulse.com.au  Fact Sheet for Healthcare Providers:  IncredibleEmployment.be  This test is no t yet approved or cleared by the [place] FDA and  has been authorized for detection and/or diagnosis of SARS-CoV-2 by FDA under an Emergency Use Authorization (EUA). This EUA will remain  in effect (meaning this test can be used) for the duration of the COVID-19 declaration under Section 564(b)(1) of the Act, 21 U.S.C.section 360bbb-3(b)(1), unless the authorization is terminated  or revoked sooner.       Influenza A by PCR NEGATIVE NEGATIVE Final   Influenza B by PCR NEGATIVE NEGATIVE Final    Comment: (NOTE) The Xpert Xpress SARS-CoV-2/FLU/RSV plus assay is intended as an aid in the diagnosis of influenza from Nasopharyngeal swab specimens and should not be used as a sole basis for treatment. Nasal washings and aspirates are unacceptable for Xpert Xpress SARS-CoV-2/FLU/RSV testing.  Fact Sheet for Patients: EntrepreneurPulse.com.au  Fact Sheet for Healthcare Providers: IncredibleEmployment.be  This test is not yet approved or cleared by the [place] FDA and has been authorized for detection and/or diagnosis of SARS-CoV-2 by FDA under an Emergency Use Authorization (EUA). This EUA will remain in effect (meaning this test can be used) for the duration of the COVID-19 declaration under Section  564(b)(1) of the Act, 21 U.S.C. section 360bbb-3(b)(1), unless the authorization is terminated or revoked.  Performed at [facility], [place] [address] [place], [place] [address]   Resp Panel by RT-PCR  (Flu A&B, Covid) Nasopharyngeal Swab     Status: None   Collection Time: 05/15/21  3:16 AM   Specimen: Nasopharyngeal Swab; Nasopharyngeal(NP) swabs in vial transport medium  Result Value Ref Range Status   SARS Coronavirus 2 by RT PCR NEGATIVE NEGATIVE Final    Comment: (NOTE) SARS-CoV-2 target nucleic acids are NOT DETECTED.  The SARS-CoV-2 RNA is generally detectable in upper respiratory specimens during the acute phase of infection. The lowest concentration of SARS-CoV-2 viral copies this assay can detect is 138 copies/mL. A negative result does not preclude SARS-Cov-2 infection and should not be used as the sole basis for treatment or other patient management decisions. A negative result may occur with  improper specimen collection/handling, submission of specimen other than nasopharyngeal swab, presence of viral mutation(s) within the areas targeted by this assay, and inadequate number of viral copies(<138 copies/mL). A negative result must be combined with clinical observations, patient history, and epidemiological information. The expected result is Negative.  Fact Sheet for Patients:  EntrepreneurPulse.com.au  Fact Sheet for Healthcare Providers:  IncredibleEmployment.be  This test is no t yet approved or cleared by the [place] FDA and  has been authorized for detection and/or diagnosis of SARS-CoV-2 by FDA under an Emergency Use Authorization (EUA). This EUA will remain  in effect (meaning this test can be used) for the duration of the COVID-19 declaration under Section 564(b)(1) of the Act, 21 U.S.C.section 360bbb-3(b)(1), unless the authorization is terminated  or revoked sooner.       Influenza A by PCR NEGATIVE NEGATIVE Final   Influenza B by PCR NEGATIVE NEGATIVE Final    Comment: (NOTE) The Xpert Xpress SARS-CoV-2/FLU/RSV plus assay is intended as an aid in the diagnosis of influenza from Nasopharyngeal swab specimens  and should not be used as a sole basis for treatment. Nasal washings and aspirates are unacceptable for Xpert Xpress SARS-CoV-2/FLU/RSV testing.  Fact Sheet for Patients: EntrepreneurPulse.com.au  Fact Sheet for Healthcare Providers: IncredibleEmployment.be  This test is not yet approved or cleared by the [place] FDA and has been authorized for detection and/or diagnosis of SARS-CoV-2 by FDA under an Emergency Use Authorization (EUA). This EUA will remain in effect (meaning this test can be used) for the duration of the COVID-19 declaration under Section 564(b)(1) of the Act, 21 U.S.C. section 360bbb-3(b)(1), unless the authorization is terminated or revoked.  Performed at [facility], [place] [address], [place], [place] [address]    No treatment necessary.  ED Provider: Davonna Belling, MD  Faustino Congress, PharmD Candidate 05/17/2021, 11:47 AM

## 2021-05-17 NOTE — Telephone Encounter (Signed)
Post ED Visit - Positive Culture Follow-up  Culture report reviewed by antimicrobial stewardship pharmacist: [place] Team '[]'$  Elenor Quinones, Pharm.D. '[]'$  Heide Guile, Pharm.D., BCPS AQ-ID '[]'$  Parks Neptune, Pharm.D., BCPS '[]'$  Alycia Rossetti, Pharm.D., BCPS '[]'$  [place], Pharm.D., BCPS, AAHIVP '[]'$  Legrand Como, Pharm.D., BCPS, AAHIVP '[]'$  Salome Arnt, PharmD, BCPS '[]'$  Johnnette Gourd, PharmD, BCPS '[]'$  Hughes Better, PharmD, BCPS '[]'$  Leeroy Cha, PharmD '[]'$  Laqueta Linden, PharmD, BCPS '[]'$  Albertina Parr, PharmD  [facility] Team '[x]'$  Willadean Carol, PharmD '[]'$  Lindell Spar, PharmD '[]'$  Royetta Asal, PharmD '[]'$  Graylin Shiver, Rph '[]'$  Rema Fendt) Glennon Mac, PharmD '[]'$  Arlyn Dunning, PharmD '[]'$  Netta Cedars, PharmD '[]'$  Dia Sitter, PharmD '[]'$  Leone Haven, PharmD '[]'$  Gretta Arab, PharmD '[]'$  Theodis Shove, PharmD '[]'$  Peggyann Juba, PharmD '[]'$  Reuel Boom, PharmD   Urine culture results, no treatment necessary per Dr. Davonna Belling, MD and no further patient follow-up is required at this time.  Glennon Hamilton 05/17/2021, 12:24 PM

## 2021-05-18 DIAGNOSIS — E559 Vitamin D deficiency, unspecified: Secondary | ICD-10-CM | POA: Diagnosis not present

## 2021-05-19 DIAGNOSIS — E119 Type 2 diabetes mellitus without complications: Secondary | ICD-10-CM | POA: Diagnosis not present

## 2021-05-19 DIAGNOSIS — I10 Essential (primary) hypertension: Secondary | ICD-10-CM | POA: Diagnosis not present

## 2021-08-23 DIAGNOSIS — I10 Essential (primary) hypertension: Secondary | ICD-10-CM | POA: Diagnosis not present

## 2021-08-23 DIAGNOSIS — J302 Other seasonal allergic rhinitis: Secondary | ICD-10-CM | POA: Diagnosis not present

## 2021-08-23 DIAGNOSIS — Z0001 Encounter for general adult medical examination with abnormal findings: Secondary | ICD-10-CM | POA: Diagnosis not present

## 2021-08-23 DIAGNOSIS — E119 Type 2 diabetes mellitus without complications: Secondary | ICD-10-CM | POA: Diagnosis not present

## 2021-08-23 DIAGNOSIS — Z720 Tobacco use: Secondary | ICD-10-CM | POA: Diagnosis not present

## 2022-02-07 DIAGNOSIS — Z1152 Encounter for screening for COVID-19: Secondary | ICD-10-CM | POA: Diagnosis not present

## 2022-04-16 DIAGNOSIS — I10 Essential (primary) hypertension: Secondary | ICD-10-CM | POA: Diagnosis not present

## 2022-04-16 DIAGNOSIS — Z0001 Encounter for general adult medical examination with abnormal findings: Secondary | ICD-10-CM | POA: Diagnosis not present

## 2022-04-16 DIAGNOSIS — J302 Other seasonal allergic rhinitis: Secondary | ICD-10-CM | POA: Diagnosis not present

## 2022-04-16 DIAGNOSIS — E119 Type 2 diabetes mellitus without complications: Secondary | ICD-10-CM | POA: Diagnosis not present

## 2022-04-16 DIAGNOSIS — Z720 Tobacco use: Secondary | ICD-10-CM | POA: Diagnosis not present

## 2022-08-06 ENCOUNTER — Emergency Department (HOSPITAL_COMMUNITY)
Admission: EM | Admit: 2022-08-06 | Discharge: 2022-08-06 | Discharge status: Against Medical Advice | Payer: Medicare Other | Attending: Student | Admitting: Student

## 2022-08-06 ENCOUNTER — Emergency Department (HOSPITAL_COMMUNITY): Payer: Medicare Other

## 2022-08-06 DIAGNOSIS — I499 Cardiac arrhythmia, unspecified: Secondary | ICD-10-CM | POA: Diagnosis not present

## 2022-08-06 DIAGNOSIS — Z5321 Procedure and treatment not carried out due to patient leaving prior to being seen by health care provider: Secondary | ICD-10-CM | POA: Diagnosis not present

## 2022-08-06 DIAGNOSIS — Z743 Need for continuous supervision: Secondary | ICD-10-CM | POA: Diagnosis not present

## 2022-08-06 DIAGNOSIS — I10 Essential (primary) hypertension: Secondary | ICD-10-CM | POA: Diagnosis not present

## 2022-08-06 DIAGNOSIS — R6889 Other general symptoms and signs: Secondary | ICD-10-CM | POA: Diagnosis not present

## 2022-08-06 DIAGNOSIS — G4489 Other headache syndrome: Secondary | ICD-10-CM | POA: Diagnosis not present

## 2022-08-06 DIAGNOSIS — R0602 Shortness of breath: Secondary | ICD-10-CM | POA: Diagnosis not present

## 2022-08-06 DIAGNOSIS — R519 Headache, unspecified: Secondary | ICD-10-CM | POA: Diagnosis present

## 2022-08-06 LAB — URINALYSIS, ROUTINE W REFLEX MICROSCOPIC
Bilirubin Urine: NEGATIVE
Glucose, UA: NEGATIVE mg/dL
Hgb urine dipstick: NEGATIVE
Ketones, ur: NEGATIVE mg/dL
Leukocytes,Ua: NEGATIVE
Nitrite: NEGATIVE
Protein, ur: NEGATIVE mg/dL
Specific Gravity, Urine: 1.014 (ref 1.005–1.030)
pH: 6 (ref 5.0–8.0)

## 2022-08-06 LAB — BASIC METABOLIC PANEL
Anion gap: 6 (ref 5–15)
BUN: 10 mg/dL (ref 8–23)
CO2: 25 mmol/L (ref 22–32)
Calcium: 8.9 mg/dL (ref 8.9–10.3)
Chloride: 108 mmol/L (ref 98–111)
Creatinine, Ser: 0.71 mg/dL (ref 0.44–1.00)
GFR, Estimated: >60 mL/min (ref >60)
Glucose, Bld: 108 mg/dL — ABNORMAL HIGH (ref 70–99)
Potassium: 3.9 mmol/L (ref 3.5–5.1)
Sodium: 139 mmol/L (ref 135–145)

## 2022-08-06 LAB — CBC WITH DIFFERENTIAL/PLATELET
Abs Immature Granulocytes: 0.02 10*3/uL (ref 0.00–0.07)
Basophils Absolute: 0 10*3/uL (ref 0.0–0.1)
Basophils Relative: 1 %
Eosinophils Absolute: 0.1 10*3/uL (ref 0.0–0.5)
Eosinophils Relative: 1 %
HCT: 40.2 % (ref 36.0–46.0)
Hemoglobin: 12.8 g/dL (ref 12.0–15.0)
Immature Granulocytes: 0 %
Lymphocytes Relative: 33 %
Lymphs Abs: 2.5 10*3/uL (ref 0.7–4.0)
MCH: 24.4 pg — ABNORMAL LOW (ref 26.0–34.0)
MCHC: 31.8 g/dL (ref 30.0–36.0)
MCV: 76.7 fL — ABNORMAL LOW (ref 80.0–100.0)
Monocytes Absolute: 0.5 10*3/uL (ref 0.1–1.0)
Monocytes Relative: 6 %
Neutro Abs: 4.4 10*3/uL (ref 1.7–7.7)
Neutrophils Relative %: 59 %
Platelets: 180 10*3/uL (ref 150–400)
RBC: 5.24 MIL/uL — ABNORMAL HIGH (ref 3.87–5.11)
RDW: 15.2 % (ref 11.5–15.5)
WBC: 7.5 10*3/uL (ref 4.0–10.5)
nRBC: 0 % (ref 0.0–0.2)

## 2022-08-06 LAB — TROPONIN I (HIGH SENSITIVITY): Troponin I (High Sensitivity): 18 ng/L — ABNORMAL HIGH (ref <18)

## 2022-08-06 NOTE — ED Notes (Signed)
Patient handed registration their labels and informed them that they were leaving.

## 2022-08-06 NOTE — ED Notes (Signed)
FREDDIE [phone]

## 2022-08-06 NOTE — ED Provider Triage Note (Signed)
Emergency Medicine Provider Triage Evaluation Note  Shannon [name] , a 71 y.o. female  was evaluated in triage.  Pt complains of high blood pressure.  States that she was sent here by her nurse.  She is otherwise asymptomatic.  She states that she is not having headache, chest pain, shortness of breath, palpitations.  She states that she did feel little "woozy headed" this morning.  States that she has been compliant on her antihypertensives..  Review of Systems  Positive: See above Negative:   Physical Exam  BP (!) 185/68 (BP Location: Right Arm)   Pulse (!) 47   Temp 98.6 F (37 C) (Oral)   Resp 16   SpO2 96%  Gen:   Awake, no distress   Resp:  Normal effort  MSK:   Moves extremities without difficulty  Other:  Bradycardia without murmur.  Nonfocal neurological exam.  Medical Decision Making  Medically screening exam initiated at 4:39 PM.  Appropriate orders placed.  Leanda Padmore Dase was informed that the remainder of the evaluation will be completed by another provider, this initial triage assessment does not replace that evaluation, and the importance of remaining in the ED until their evaluation is complete.     Mickie Hillier, PA-C 08/06/22 1639

## 2022-08-06 NOTE — ED Triage Notes (Addendum)
Patient BIB [facility] from home for evaluation of headache and hypertension that started yesterday, EMS reports bradycardia with HR 38-40.  Patient took '1000mg'$  acetaminophen at 1300. Patient is alert, oriented, and in no apparent distress at this time. Patient states she had her antihypertensive medications changed recently but is unsure of what the changes were.  BP 176/100, reports compliance with antihypertensive medications

## 2023-10-25 DIAGNOSIS — E119 Type 2 diabetes mellitus without complications: Secondary | ICD-10-CM | POA: Diagnosis not present

## 2023-10-25 DIAGNOSIS — I10 Essential (primary) hypertension: Secondary | ICD-10-CM | POA: Diagnosis not present

## 2023-10-25 DIAGNOSIS — Z720 Tobacco use: Secondary | ICD-10-CM | POA: Diagnosis not present

## 2023-10-25 DIAGNOSIS — M25551 Pain in right hip: Secondary | ICD-10-CM | POA: Diagnosis not present

## 2023-10-29 ENCOUNTER — Other Ambulatory Visit: Payer: Self-pay | Admitting: Physician Assistant

## 2023-10-29 DIAGNOSIS — Z1231 Encounter for screening mammogram for malignant neoplasm of breast: Secondary | ICD-10-CM

## 2023-11-08 DIAGNOSIS — M25551 Pain in right hip: Secondary | ICD-10-CM | POA: Diagnosis not present

## 2023-11-08 DIAGNOSIS — Z720 Tobacco use: Secondary | ICD-10-CM | POA: Diagnosis not present

## 2023-11-08 DIAGNOSIS — J302 Other seasonal allergic rhinitis: Secondary | ICD-10-CM | POA: Diagnosis not present

## 2023-11-08 DIAGNOSIS — I10 Essential (primary) hypertension: Secondary | ICD-10-CM | POA: Diagnosis not present

## 2023-11-08 DIAGNOSIS — E119 Type 2 diabetes mellitus without complications: Secondary | ICD-10-CM | POA: Diagnosis not present

## 2023-11-13 DIAGNOSIS — M792 Neuralgia and neuritis, unspecified: Secondary | ICD-10-CM | POA: Diagnosis not present

## 2023-11-13 DIAGNOSIS — M19072 Primary osteoarthritis, left ankle and foot: Secondary | ICD-10-CM | POA: Diagnosis not present

## 2023-11-13 DIAGNOSIS — M19071 Primary osteoarthritis, right ankle and foot: Secondary | ICD-10-CM | POA: Diagnosis not present

## 2023-11-15 ENCOUNTER — Other Ambulatory Visit: Payer: Self-pay | Admitting: *Deleted

## 2023-11-15 DIAGNOSIS — M79606 Pain in leg, unspecified: Secondary | ICD-10-CM

## 2023-11-18 DIAGNOSIS — E119 Type 2 diabetes mellitus without complications: Secondary | ICD-10-CM | POA: Diagnosis not present

## 2023-11-18 DIAGNOSIS — Z720 Tobacco use: Secondary | ICD-10-CM | POA: Diagnosis not present

## 2023-11-18 DIAGNOSIS — M25551 Pain in right hip: Secondary | ICD-10-CM | POA: Diagnosis not present

## 2023-11-18 DIAGNOSIS — I10 Essential (primary) hypertension: Secondary | ICD-10-CM | POA: Diagnosis not present

## 2023-11-28 NOTE — Progress Notes (Signed)
 Patient ID: Shannon [name], female   DOB: [birth year], 73 y.o.   MRN: [id number]  Reason for Consult: New Patient (Initial Visit)   Referred by Rosalea Rosina SAILOR, PA  Subjective:     HPI  Shannon [name] is a 73 y.o. female with diabetes, hypertension, hyperlipidemia and schizophrenia who presents with right hip pain.  She reports right lower back pain that radiates to her hip and thigh.  She specifically denies any calf pain or foot pain.  She reports her ambulation is limited by her hip pain and denies any calf pain when walking.  She denies nocturnal rest pain.  She denies nonhealing wounds.  She is a current smoker.  Past Medical History:  Diagnosis Date   Anxiety    Depression    Diabetes mellitus    Hyperlipidemia    Hypertension    Schizophrenia (HCC)    Family History  Problem Relation Age of Onset   Kidney disease Mother    Past Surgical History:  Procedure Laterality Date   BREAST SURGERY     CRANIOTOMY Left 12/15/2020   Procedure: LEFT CRANIOTOMY FOR INTRACRANIAL ANEURYSM;  Surgeon: Lanis Pupa, MD;  Location: [facility] OR;  Service: Neurosurgery;  Laterality: Left;   EXPLORATORY LAPAROTOMY     INSERTION OF SUPRAPUBIC CATHETER  12/15/2020   Procedure: INSERTION OF FOLEY CATHETER;  Surgeon: Rosalind Zachary NOVAK, MD;  Location: [facility] OR;  Service: Urology;;    Short Social History:  Social History   Tobacco Use   Smoking status: Every Day    Current packs/day: 1.00    Average packs/day: 1 pack/day for 40.0 years (40.0 ttl pk-yrs)    Types: Cigarettes   Smokeless tobacco: Never  Substance Use Topics   Alcohol use: Yes    Alcohol/week: 1.0 standard drink of alcohol    Types: 1 Cans of beer per week    No Known Allergies  Current Outpatient Medications  Medication Sig Dispense Refill   amLODipine  (NORVASC ) 5 MG tablet Take 1 tablet (5 mg total) by mouth daily. For hypertension. 30 tablet 0   lithium  carbonate (LITHOBID ) 300 MG CR tablet Take 300 mg by mouth 2  (two) times daily.     OLANZapine  (ZYPREXA ) 20 MG tablet Take 20 mg by mouth at bedtime.     acetaminophen  (TYLENOL ) 500 MG tablet Take 1,000 mg by mouth every 6 (six) hours as needed for mild pain. (Patient not taking: Reported on 11/29/2023)     albuterol (VENTOLIN HFA) 108 (90 Base) MCG/ACT inhaler Inhale 2 puffs into the lungs every 6 (six) hours as needed for shortness of breath or wheezing. (Patient not taking: Reported on 11/29/2023)     No current facility-administered medications for this visit.    REVIEW OF SYSTEMS   All other systems were reviewed and are negative     Objective:  Objective   Vitals:   11/29/23 1526  BP: (!) 164/75  Pulse: (!) 50  Resp: 20  Temp: 97.9 F (36.6 C)  SpO2: 96%  Weight: 148 lb (67.1 kg)  Height: 5' 3 (1.6 m)   Body mass index is 26.22 kg/m.  Physical Exam General: no acute distress Cardiac: hemodynamically stable Pulm: normal work of breathing Neuro: alert, no focal deficit Extremities: no edema, cyanosis or wounds Vascular:   Right: Palpable femoral, nonpalpable pedal's  Left: Palpable femoral, nonpalpable pedal's   Data: ABI +---------+------------------+-----+----------+--------+  Right   Rt Pressure (mmHg)IndexWaveform  Comment   +---------+------------------+-----+----------+--------+  Brachial 158                                        +---------+------------------+-----+----------+--------+  PTA     84                0.53 monophasic          +---------+------------------+-----+----------+--------+  DP      68                0.43 monophasic          +---------+------------------+-----+----------+--------+  Great Toe41                0.26                     +---------+------------------+-----+----------+--------+   +---------+------------------+-----+----------+-------+  Left    Lt Pressure (mmHg)IndexWaveform  Comment  +---------+------------------+-----+----------+-------+   Brachial 154                                       +---------+------------------+-----+----------+-------+  PTA     120               0.76 monophasic         +---------+------------------+-----+----------+-------+  DP      101               0.64 monophasic         +---------+------------------+-----+----------+-------+  Great Toe77                0.49                    +---------+------------------+-----+----------+-------+   BMP reviewed, creatinine 0.71  CBC reviewed     Assessment/Plan:     Shannon [name] is a 73 y.o. female with hypertension, hyperlipidemia, diabetes and schizophrenia with asymptomatic PAD.  I explained that her perfusion to her lower extremities is decreased bilaterally although there is no indication for intervention as she is asymptomatic at this time.  I explained that even if we were to proceed with intervention to improve her perfusion her symptoms of right lower back and hip pain would not improve.  I did explain that she does have PAD and encouraged smoking cessation.  Will plan to follow-up in 12 months with a repeat ABI.  She was instructed to call the office for an earlier appointment should she develop significant foot pain or slow/nonhealing wounds    Recommendations to optimize cardiovascular risk: Abstinence from all tobacco products. Blood glucose control with goal A1c < 7%. Blood pressure control with goal blood pressure < 140/90 mmHg. Lipid reduction therapy with goal LDL-C <100 mg/dL  Aspirin  81mg  PO QD.  Atorvastatin  40-80mg  PO QD (or other high intensity statin therapy).     Norman GORMAN Serve MD [facility]

## 2023-11-29 ENCOUNTER — Ambulatory Visit (INDEPENDENT_AMBULATORY_CARE_PROVIDER_SITE_OTHER): Payer: 59 | Admitting: Vascular Surgery

## 2023-11-29 ENCOUNTER — Ambulatory Visit (HOSPITAL_COMMUNITY)
Admission: RE | Admit: 2023-11-29 | Discharge: 2023-11-29 | Disposition: A | Discharge status: Home / Self Care | Payer: 59 | Source: Ambulatory Visit | Attending: Vascular Surgery | Admitting: Vascular Surgery

## 2023-11-29 VITALS — BP 164/75 | HR 50 | Temp 97.9°F | Resp 20 | Ht 63.0 in | Wt 148.0 lb

## 2023-11-29 DIAGNOSIS — I739 Peripheral vascular disease, unspecified: Secondary | ICD-10-CM

## 2023-11-29 DIAGNOSIS — M79606 Pain in leg, unspecified: Secondary | ICD-10-CM | POA: Diagnosis not present

## 2023-11-29 LAB — VAS US ABI WITH/WO TBI
Left ABI: 0.76
Right ABI: 0.53

## 2023-12-26 DIAGNOSIS — Z0001 Encounter for general adult medical examination with abnormal findings: Secondary | ICD-10-CM | POA: Diagnosis not present

## 2023-12-26 DIAGNOSIS — Z720 Tobacco use: Secondary | ICD-10-CM | POA: Diagnosis not present

## 2023-12-26 DIAGNOSIS — I10 Essential (primary) hypertension: Secondary | ICD-10-CM | POA: Diagnosis not present

## 2023-12-26 DIAGNOSIS — E119 Type 2 diabetes mellitus without complications: Secondary | ICD-10-CM | POA: Diagnosis not present

## 2024-02-12 DIAGNOSIS — Z1231 Encounter for screening mammogram for malignant neoplasm of breast: Secondary | ICD-10-CM | POA: Diagnosis not present

## 2024-03-11 DIAGNOSIS — B351 Tinea unguium: Secondary | ICD-10-CM | POA: Diagnosis not present

## 2024-03-27 DIAGNOSIS — Z720 Tobacco use: Secondary | ICD-10-CM | POA: Diagnosis not present

## 2024-03-27 DIAGNOSIS — E119 Type 2 diabetes mellitus without complications: Secondary | ICD-10-CM | POA: Diagnosis not present

## 2024-03-27 DIAGNOSIS — I10 Essential (primary) hypertension: Secondary | ICD-10-CM | POA: Diagnosis not present

## 2024-06-26 DIAGNOSIS — E119 Type 2 diabetes mellitus without complications: Secondary | ICD-10-CM | POA: Diagnosis not present

## 2024-06-26 DIAGNOSIS — Z720 Tobacco use: Secondary | ICD-10-CM | POA: Diagnosis not present

## 2024-06-26 DIAGNOSIS — I10 Essential (primary) hypertension: Secondary | ICD-10-CM | POA: Diagnosis not present

## 2024-07-02 DIAGNOSIS — I70203 Unspecified atherosclerosis of native arteries of extremities, bilateral legs: Secondary | ICD-10-CM | POA: Diagnosis not present

## 2024-07-02 DIAGNOSIS — I739 Peripheral vascular disease, unspecified: Secondary | ICD-10-CM | POA: Diagnosis not present

## 2024-07-02 DIAGNOSIS — L84 Corns and callosities: Secondary | ICD-10-CM | POA: Diagnosis not present

## 2024-07-02 DIAGNOSIS — M2041 Other hammer toe(s) (acquired), right foot: Secondary | ICD-10-CM | POA: Diagnosis not present

## 2024-07-02 DIAGNOSIS — B351 Tinea unguium: Secondary | ICD-10-CM | POA: Diagnosis not present

## 2024-07-02 DIAGNOSIS — M19071 Primary osteoarthritis, right ankle and foot: Secondary | ICD-10-CM | POA: Diagnosis not present

## 2024-07-15 DIAGNOSIS — I70203 Unspecified atherosclerosis of native arteries of extremities, bilateral legs: Secondary | ICD-10-CM | POA: Diagnosis not present

## 2024-07-15 DIAGNOSIS — I739 Peripheral vascular disease, unspecified: Secondary | ICD-10-CM | POA: Diagnosis not present

## 2024-10-19 NOTE — Progress Notes (Addendum)
 Shannon [name]                                          MRN: [id number]   10/19/2024   The [facility] Quality Team Specialist reviewed this patient medical record for the purposes of chart review for care gap closure. The following were reviewed: chart review for care gap closure-diabetic eye exam.  11/02/2024- no GSD to close 2025    [facility] Quality Team

## 2024-10-28 ENCOUNTER — Ambulatory Visit
Admission: RE | Admit: 2024-10-28 | Discharge: 2024-10-28 | Disposition: A | Discharge status: Home / Self Care | Source: Ambulatory Visit | Attending: Physician Assistant | Admitting: Physician Assistant

## 2024-10-28 DIAGNOSIS — Z1231 Encounter for screening mammogram for malignant neoplasm of breast: Secondary | ICD-10-CM

## 2025-01-21 ENCOUNTER — Ambulatory Visit

## 2025-01-21 ENCOUNTER — Ambulatory Visit (HOSPITAL_COMMUNITY)
